# Patient Record
Sex: Female | Born: 1955
Health system: Southern US, Community
[De-identification: ages and names within clinical notes are randomized; demographics above are authoritative.]

## PROBLEM LIST (undated history)

## (undated) DIAGNOSIS — E039 Hypothyroidism, unspecified: Secondary | ICD-10-CM

## (undated) DIAGNOSIS — E7800 Pure hypercholesterolemia, unspecified: Secondary | ICD-10-CM

## (undated) DIAGNOSIS — M1990 Unspecified osteoarthritis, unspecified site: Secondary | ICD-10-CM

## (undated) DIAGNOSIS — I10 Essential (primary) hypertension: Secondary | ICD-10-CM

## (undated) DIAGNOSIS — E079 Disorder of thyroid, unspecified: Secondary | ICD-10-CM

## (undated) DIAGNOSIS — I6529 Occlusion and stenosis of unspecified carotid artery: Secondary | ICD-10-CM

## (undated) HISTORY — DX: Occlusion and stenosis of unspecified carotid artery: I65.29

## (undated) HISTORY — PX: JOINT REPLACEMENT: SHX530

## (undated) HISTORY — DX: Pure hypercholesterolemia, unspecified: E78.00

---

## 1980-11-30 HISTORY — PX: BACK SURGERY: SHX140

## 2005-09-02 ENCOUNTER — Emergency Department (HOSPITAL_COMMUNITY): Admission: EM | Admit: 2005-09-02 | Discharge: 2005-09-02 | Payer: Self-pay | Admitting: Emergency Medicine

## 2010-11-30 HISTORY — PX: HIP PINNING: SHX1757

## 2013-12-30 ENCOUNTER — Emergency Department (HOSPITAL_COMMUNITY)
Admission: EM | Admit: 2013-12-30 | Discharge: 2013-12-30 | Disposition: A | Discharge status: Home / Self Care | Payer: No Typology Code available for payment source | Attending: Emergency Medicine | Admitting: Emergency Medicine

## 2013-12-30 ENCOUNTER — Emergency Department (HOSPITAL_COMMUNITY): Payer: No Typology Code available for payment source

## 2013-12-30 ENCOUNTER — Encounter (HOSPITAL_COMMUNITY): Payer: Self-pay | Admitting: Emergency Medicine

## 2013-12-30 DIAGNOSIS — E079 Disorder of thyroid, unspecified: Secondary | ICD-10-CM | POA: Insufficient documentation

## 2013-12-30 DIAGNOSIS — Z79899 Other long term (current) drug therapy: Secondary | ICD-10-CM | POA: Insufficient documentation

## 2013-12-30 DIAGNOSIS — Z8781 Personal history of (healed) traumatic fracture: Secondary | ICD-10-CM | POA: Insufficient documentation

## 2013-12-30 DIAGNOSIS — M25569 Pain in unspecified knee: Secondary | ICD-10-CM | POA: Insufficient documentation

## 2013-12-30 DIAGNOSIS — M25562 Pain in left knee: Secondary | ICD-10-CM

## 2013-12-30 HISTORY — DX: Disorder of thyroid, unspecified: E07.9

## 2013-12-30 MED ORDER — OXYCODONE-ACETAMINOPHEN 5-325 MG PO TABS: 1.0000 | ORAL_TABLET | ORAL | Status: DC | PRN

## 2013-12-30 MED ORDER — OXYCODONE-ACETAMINOPHEN 5-325 MG PO TABS
1.0000 | ORAL_TABLET | Freq: Once | ORAL | Status: AC
  Administered 2013-12-30: 1 via ORAL
  Filled 2013-12-30: qty 1

## 2013-12-30 NOTE — ED Notes (Signed)
She c/o nn-traumatic left knee pain/stiffness x 6 days.  She is in no distress.  She cites hx of left hip pinning with subsequent infection "years ago".

## 2013-12-30 NOTE — ED Notes (Signed)
Pt reports marked decrease in pain

## 2013-12-30 NOTE — ED Provider Notes (Signed)
Medical screening examination/treatment/procedure(s) were performed by non-physician practitioner and as supervising physician I was immediately available for consultation/collaboration.  EKG Interpretation   None        Derwood KaplanAnkit Editha Bridgeforth, MD 12/30/13 1549

## 2013-12-30 NOTE — Discharge Instructions (Signed)
Take the prescribed medication as directed. Follow-up with orthopedics, Dr. Roda ShuttersXu for further evaluation and management of arthritis. Return to the ED for new or worsening symptoms.

## 2013-12-30 NOTE — ED Provider Notes (Signed)
CSN: [id number]     Arrival date & time 12/30/13  7829 History   First MD Initiated Contact with Patient 12/30/13 1010     Chief Complaint  Patient presents with  . Knee Pain   (Consider location/radiation/quality/duration/timing/severity/associated sxs/prior Treatment) The history is provided by the patient and medical records.   This is a 58 year old female with past medical history significant for thyroid disease, presenting to the ED for left knee pain.  Patient states pain has been present for the past 11 years, but worse over the past 6 days. No recent injury, trauma, or falls.  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 Patient states he feels like he never fully recovered from the infection. Patient is ambulating with a cane but states she muscular up and down stairs in her home which she thinks is making her pain worse. She denies any numbness or paresthesias of left lower extremity.  No recent fevers or chills.  She has tried icing and elevating knee, heat therapy, wrapping with ace bandage, and alternating tylenol/motrin without improvement.  Past Medical History  Diagnosis Date  . Thyroid disease    Past Surgical History  Procedure Laterality Date  . Hip pinning Left    History reviewed. No pertinent family history. History  Substance Use Topics  . Smoking status: Never Smoker   . Smokeless tobacco: Not on file  . Alcohol Use: Not on file   OB History   Grav Para Term Preterm Abortions TAB SAB Ect Mult Living                 Review of Systems  Musculoskeletal: Positive for arthralgias.  All other systems reviewed and are negative.    Allergies  Nsaids  Home Medications   Current Outpatient Rx  Name  Route  Sig  Dispense  Refill  . acetaminophen (TYLENOL) 325 MG tablet   Oral   Take 650 mg by mouth every 6 (six) hours as needed for moderate pain.         . hydrochlorothiazide  (MICROZIDE) 12.5 MG capsule   Oral   Take 12.5 mg by mouth every morning.         Marland Kitchen levothyroxine (SYNTHROID, LEVOTHROID) 125 MCG tablet   Oral   Take 125 mcg by mouth daily before breakfast.          BP 141/91  Pulse 84  Temp(Src) 97.8 F (36.6 C) (Oral)  Resp 12  SpO2 98%  Physical Exam  Nursing note and vitals reviewed. Constitutional: She is oriented to person, place, and time. She appears well-developed and well-nourished.  HENT:  Head: Normocephalic and atraumatic.  Mouth/Throat: Oropharynx is clear and moist.  Eyes: Conjunctivae and EOM are normal. Pupils are equal, round, and reactive to light.  Neck: Normal range of motion.  Cardiovascular: Normal rate, regular rhythm and normal heart sounds.   Pulmonary/Chest: Effort normal and breath sounds normal. No respiratory distress. She has no wheezes.  Musculoskeletal:       Left knee: She exhibits decreased range of motion, swelling, effusion and bony tenderness. She exhibits no ecchymosis, normal alignment, no LCL laxity and no MCL laxity. Tenderness found. Medial joint line tenderness noted.  Left knee with TTP and knee effusion along medial joint line; limited ROM due to pain with crepitus present; distal sensation intact; ambulating assisted with cane  Neurological: She is alert and oriented to person, place, and time.  Skin: Skin is warm and dry.  Psychiatric: She has a normal mood and affect.    ED Course  Procedures (including critical care time) Labs Review Labs Reviewed - No data to display Imaging Review Dg Knee Complete 4 Views Left  12/30/2013   CLINICAL DATA:  Worsening knee pain. Inability to bear weight. Limited range of motion.  EXAM: LEFT KNEE - COMPLETE 4+ VIEW  COMPARISON:  None.  FINDINGS: No evidence of acute fracture or dislocation.  A large knee joint effusion is seen. Moderate to severe tricompartmental osteoarthritis is seen.  IMPRESSION: Large knee joint effusion.  Tricompartmental  osteoarthritis.   Electronically Signed   By: Myles RosenthalJohn  Stahl M.D.   On: 12/30/2013 10:31    EKG Interpretation   None       MDM   1. Knee pain, left    X-ray with large knee joint effusion and severe osteoarthritis. Patient does not want knee effusion drained at this time. No signs/sx concerning for gout or septic joint.  Will apply knee sleeve and started on Percocet. She is referred to orthopedics for followup given her prior history.  Discussed plan with pt and son at bedside, they acknowledged understanding and agreed.  Return precautions advised.  Garlon HatchetLisa M Lashina Milles, PA-C 12/30/13 1227

## 2014-01-22 ENCOUNTER — Ambulatory Visit: Payer: No Typology Code available for payment source | Attending: Internal Medicine | Admitting: Internal Medicine

## 2014-01-22 ENCOUNTER — Encounter: Payer: Self-pay | Admitting: Internal Medicine

## 2014-01-22 VITALS — BP 135/70 | HR 93 | Temp 97.9°F | Resp 16 | Wt 157.0 lb

## 2014-01-22 DIAGNOSIS — E039 Hypothyroidism, unspecified: Secondary | ICD-10-CM

## 2014-01-22 DIAGNOSIS — M1712 Unilateral primary osteoarthritis, left knee: Secondary | ICD-10-CM

## 2014-01-22 DIAGNOSIS — M1710 Unilateral primary osteoarthritis, unspecified knee: Secondary | ICD-10-CM | POA: Insufficient documentation

## 2014-01-22 DIAGNOSIS — IMO0002 Reserved for concepts with insufficient information to code with codable children: Secondary | ICD-10-CM

## 2014-01-22 DIAGNOSIS — L989 Disorder of the skin and subcutaneous tissue, unspecified: Secondary | ICD-10-CM | POA: Insufficient documentation

## 2014-01-22 DIAGNOSIS — Z139 Encounter for screening, unspecified: Secondary | ICD-10-CM

## 2014-01-22 DIAGNOSIS — I10 Essential (primary) hypertension: Secondary | ICD-10-CM | POA: Insufficient documentation

## 2014-01-22 DIAGNOSIS — F17200 Nicotine dependence, unspecified, uncomplicated: Secondary | ICD-10-CM | POA: Insufficient documentation

## 2014-01-22 LAB — COMPLETE METABOLIC PANEL WITH GFR
ALT: 12 U/L (ref 0–35)
AST: 13 U/L (ref 0–37)
Albumin: 4.5 g/dL (ref 3.5–5.2)
Alkaline Phosphatase: 64 U/L (ref 39–117)
BUN: 14 mg/dL (ref 6–23)
CO2: 29 meq/L (ref 19–32)
CREATININE: 0.59 mg/dL (ref 0.50–1.10)
Calcium: 9.5 mg/dL (ref 8.4–10.5)
Chloride: 104 mEq/L (ref 96–112)
GFR, Est African American: >89 mL/min
GFR, Est Non African American: >89 mL/min
Glucose, Bld: 98 mg/dL (ref 70–99)
Potassium: 4.1 mEq/L (ref 3.5–5.3)
Sodium: 140 mEq/L (ref 135–145)
Total Bilirubin: 0.2 mg/dL (ref 0.2–1.2)
Total Protein: 7.3 g/dL (ref 6.0–8.3)

## 2014-01-22 LAB — CBC WITH DIFFERENTIAL/PLATELET
BASOS PCT: 1 % (ref 0–1)
Basophils Absolute: 0.1 10*3/uL (ref 0.0–0.1)
EOS ABS: 0.6 10*3/uL (ref 0.0–0.7)
Eosinophils Relative: 7 % — ABNORMAL HIGH (ref 0–5)
HCT: 40 % (ref 36.0–46.0)
Hemoglobin: 13.7 g/dL (ref 12.0–15.0)
Lymphocytes Relative: 37 % (ref 12–46)
Lymphs Abs: 3.3 10*3/uL (ref 0.7–4.0)
MCH: 32.1 pg (ref 26.0–34.0)
MCHC: 34.3 g/dL (ref 30.0–36.0)
MCV: 93.7 fL (ref 78.0–100.0)
Monocytes Absolute: 0.7 10*3/uL (ref 0.1–1.0)
Monocytes Relative: 8 % (ref 3–12)
Neutro Abs: 4.1 10*3/uL (ref 1.7–7.7)
Neutrophils Relative %: 47 % (ref 43–77)
Platelets: 333 10*3/uL (ref 150–400)
RBC: 4.27 MIL/uL (ref 3.87–5.11)
RDW: 13.5 % (ref 11.5–15.5)
WBC: 8.8 10*3/uL (ref 4.0–10.5)

## 2014-01-22 LAB — LIPID PANEL
Cholesterol: 226 mg/dL — ABNORMAL HIGH (ref 0–200)
HDL: 50 mg/dL (ref >39)
TRIGLYCERIDES: 418 mg/dL — AB (ref <150)
Total CHOL/HDL Ratio: 4.5 Ratio

## 2014-01-22 MED ORDER — HYDROCHLOROTHIAZIDE 12.5 MG PO CAPS: 12.5000 mg | ORAL_CAPSULE | Freq: Every morning | ORAL | Status: DC

## 2014-01-22 MED ORDER — LEVOTHYROXINE SODIUM 125 MCG PO TABS: 125.0000 ug | ORAL_TABLET | Freq: Every day | ORAL | Status: DC

## 2014-01-22 NOTE — Progress Notes (Signed)
Patient Demographics  Tiffany Navarro, is a 58 y.o. female  BJY:[id number]  YQM:[id number]  DOB - 03/22/1956  CC:  Chief Complaint  Patient presents with  . Establish Care       HPI: Tiffany Navarro is a 58 y.o. female here today to establish medical care.patient has history of hypertension and is on hydrochlorothiazide 12.5 mg daily, history of hypothyroidism and is taking levothyroxine 125 mcg daily, recently she went to the emergency room with symptoms of left knee pain, EMR reviewed she was diagnosed with left knee effusion and osteoarthritisher and was advised to see orthopedic Dr.patient reports improvement in the symptoms and currently takes only Tylenol when necessary for pain.patient also reported to have prescribed lesion on her right side of face for several years and is requesting to see a dermatologist. Patient denies any discharge fever chills. Patient has No headache, No chest pain, No abdominal pain - No Nausea, No new weakness tingling or numbness, No Cough - SOB.  Allergies  Allergen Reactions  . Nsaids Other (See Comments)    "stamach pain"   Past Medical History  Diagnosis Date  . Thyroid disease    Current Outpatient Prescriptions on File Prior to Visit  Medication Sig Dispense Refill  . acetaminophen (TYLENOL) 325 MG tablet Take 650 mg by mouth every 6 (six) hours as needed for moderate pain.      Marland Kitchen. oxyCODONE-acetaminophen (PERCOCET/ROXICET) 5-325 MG per tablet Take 1 tablet by mouth every 4 (four) hours as needed.  15 tablet  0   No current facility-administered medications on file prior to visit.   Family History  Problem Relation Age of Onset  . Hypertension Mother   . Hypertension Father   . Stroke Father   . Hypertension Brother    History   Social History  . Marital Status: Legally Separated    Spouse Name: N/A    Number of Children: N/A  . Years of Education: N/A   Occupational History  . Not on file.   Social History Main Topics    . Smoking status: Current Some Day Smoker  . Smokeless tobacco: Not on file  . Alcohol Use: No  . Drug Use: No  . Sexual Activity: Not on file   Other Topics Concern  . Not on file   Social History Narrative  . No narrative on file    Review of Systems: Constitutional: Negative for fever, chills, diaphoresis, activity change, appetite change and fatigue. HENT: Negative for ear pain, nosebleeds, congestion, facial swelling, rhinorrhea, neck pain, neck stiffness and ear discharge.  Eyes: Negative for pain, discharge, redness, itching and visual disturbance. Respiratory: Negative for cough, choking, chest tightness, shortness of breath, wheezing and stridor.  Cardiovascular: Negative for chest pain, palpitations and leg swelling. Gastrointestinal: Negative for abdominal distention. Genitourinary: Negative for dysuria, urgency, frequency, hematuria, flank pain, decreased urine volume, difficulty urinating and dyspareunia.  Musculoskeletal: Negative for back pain, joint swelling, +arthralgia and gait problem. Neurological: Negative for dizziness, tremors, seizures, syncope, facial asymmetry, speech difficulty, weakness, light-headedness, numbness and headaches.  Hematological: Negative for adenopathy. Does not bruise/bleed easily. Psychiatric/Behavioral: Negative for hallucinations, behavioral problems, confusion, dysphoric mood, decreased concentration and agitation.    Objective:   Filed Vitals:   01/22/14 1546  BP: 135/70  Pulse:   Temp:   Resp:     Physical Exam: Constitutional: Patient appears well-developed and well-nourished. No distress. HENT: Normocephalic, atraumatic, External right and left ear normal. Oropharynx is clear and moist.  Eyes:  Conjunctivae and EOM are normal. PERRLA, no scleral icterus. Neck: Normal ROM. Neck supple. No JVD. No tracheal deviation. No thyromegaly. CVS: RRR, S1/S2 +, no murmurs, no gallops, no carotid bruit.  Pulmonary: Effort and breath  sounds normal, no stridor, rhonchi, wheezes, rales.  Abdominal: Soft. BS +, no distension, tenderness, rebound or guarding.  Musculoskeletal: Normal range of motion. Left tenderness, crepitation positive Neuro: Alert. Normal reflexes, muscle tone coordination. No cranial nerve deficit. Skin: Skin is warm and dry. No rash noted. Not diaphoretic. Right side of the face patient has localized lesion non-tender no apparent discharge. Psychiatric: Normal mood and affect. Behavior, judgment, thought content normal.  No results found for this basename: WBC, HGB, HCT, MCV, PLT   No results found for this basename: CREATININE, BUN, NA, K, CL, CO2    No results found for this basename: HGBA1C   Lipid Panel  No results found for this basename: chol, trig, hdl, cholhdl, vldl, ldlcalc       Assessment and plan:   1. Unspecified hypothyroidism Recheck TSH level. - levothyroxine (SYNTHROID, LEVOTHROID) 125 MCG tablet; Take 1 tablet (125 mcg total) by mouth daily before breakfast.  Dispense: 30 tablet; Refill: 2 - TSH  2. Essential hypertension, benign Advise for low salt diet continue with hydrochlorothiazide. - hydrochlorothiazide (MICROZIDE) 12.5 MG capsule; Take 1 capsule (12.5 mg total) by mouth every morning.  Dispense: 30 capsule; Refill: 2 - COMPLETE METABOLIC PANEL WITH GFR  3. Osteoarthritis of left knee  - Ambulatory referral to Orthopedic Surgery  4. Skin lesion of face  - Ambulatory referral to Dermatology  5. Screening  - CBC with Differential - Lipid panel - Vit D  25 hydroxy (rtn osteoporosis monitoring)        Health Maintenance -Colonoscopy: uptodate   -Mammogram: uptodate  -Vaccinations:    uptodate with -Influenza  Return in about 6 weeks (around 03/05/2014).   Doris Cheadle, MD

## 2014-01-22 NOTE — Progress Notes (Signed)
Patient here to establish care Has history of three hip surgeries and two back surgeries Has thyroid disease Complains of bilateral knee pain

## 2014-01-23 LAB — TSH: TSH: 4.346 u[IU]/mL (ref 0.350–4.500)

## 2014-01-23 LAB — VITAMIN D 25 HYDROXY (VIT D DEFICIENCY, FRACTURES): VIT D 25 HYDROXY: 33 ng/mL (ref 30–89)

## 2014-01-24 ENCOUNTER — Telehealth: Payer: Self-pay

## 2014-01-24 NOTE — Telephone Encounter (Signed)
Message copied by Lestine MountJUAREZ, Doll Frazee L on Wed Jan 24, 2014 12:46 PM ------      Message from: Doris CheadleADVANI, DEEPAK      Created: Wed Jan 24, 2014 12:15 PM       Blood work reviewed, noticed elevated triglycerides, advise patient for low fat diet. And start taking fenofibrate 145 mg daily       ------

## 2014-01-24 NOTE — Telephone Encounter (Signed)
Spoke with Tiffany FanningJulie She will let her mom know the results

## 2014-02-02 ENCOUNTER — Ambulatory Visit: Payer: No Typology Code available for payment source | Admitting: Family Medicine

## 2014-02-16 ENCOUNTER — Ambulatory Visit (INDEPENDENT_AMBULATORY_CARE_PROVIDER_SITE_OTHER): Payer: No Typology Code available for payment source | Admitting: Family Medicine

## 2014-02-16 ENCOUNTER — Encounter: Payer: Self-pay | Admitting: Family Medicine

## 2014-02-16 VITALS — BP 130/82 | HR 65 | Ht 65.0 in | Wt 157.0 lb

## 2014-02-16 DIAGNOSIS — M1712 Unilateral primary osteoarthritis, left knee: Secondary | ICD-10-CM

## 2014-02-16 DIAGNOSIS — M1710 Unilateral primary osteoarthritis, unspecified knee: Secondary | ICD-10-CM

## 2014-02-16 DIAGNOSIS — IMO0002 Reserved for concepts with insufficient information to code with codable children: Secondary | ICD-10-CM

## 2014-02-16 MED ORDER — MELOXICAM 15 MG PO TABS: 15.0000 mg | ORAL_TABLET | Freq: Every day | ORAL | Status: DC

## 2014-02-16 MED ORDER — METHYLPREDNISOLONE ACETATE 40 MG/ML IJ SUSP
40.0000 mg | Freq: Once | INTRAMUSCULAR | Status: AC
  Administered 2014-02-16: 40 mg via INTRA_ARTICULAR

## 2014-02-16 NOTE — Progress Notes (Signed)
CC: Left knee pain HPI: Patient is a very pleasant 58 year old female who presents for evaluation of left knee pain. She states she has had a little difficulty with her left knee for a long time but it really flared up in October and then even more in December or January. 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 She feels like she cannot walk properly and walks on her toe. She denies any injury, trigger, or fall as etiology of the increased pain. She does have a history of a left hip fracture that required a total of 3 surgeries do to what sounds like infection. She is currently on tumor it was black pepper and has also try glucosamine and fish oil. She does have some Percocet that she takes as needed about one half tablet every other week when she has more severe pain. She has not really had any other treatment for her pain.  ROS: As above in the HPI. All other systems are stable or negative.  PMH: Hypertension, hypothyroidism  PSH: Left hip surgery x3, back surgery  Social: Patient is a former smoker but quit a long time ago. She does not drink alcohol. She previously worked as a Investment banker, operationalchef but has been unable to work due to her knee pain. Family: No family history of diabetes, heart disease, high blood pressure  Allergies: None    OBJECTIVE: APPEARANCE:  Patient in no acute distress.The patient appeared well nourished and normally developed. HEENT: No scleral icterus. Conjunctiva non-injected Resp: Non labored Skin: No rash MSK: Left Knee - Inspection shows valgus deformity and bony hypertrophy. No effusion. Gait shows 10 flexion contracture of the left knee with toe-touch gait. - Palpation with lateral joint line tenderness - ROM with loss of 10 of extension - Strength 5/5 in flexion and extension. - Ligaments with solid consistent endpoints including ACL, PCL, LCL, MCL.  - Neurovascularly intact  MSK  US: Not performed  Radiographs: X-rays of left knee from outside facility were reviewed today. This shows end-stage lateral compartment of DJD of the left knee as well as severe patellofemoral compartment disease. There is significant valgus deformity  ASSESSMENT: #1. End-stage left knee DJD  PLAN: Discuss treatment options with the patient. Encouraged her to continue her supplements. We will also add meloxicam every morning for one week and then as needed. We will get her into physical therapy for strengthening. We will pursue a corticosteroid injection today. She will followup in 6 weeks. At that time I would like to readdress her functional leg length discrepancy and see if there are any modifications to her shoes that would improve her gait. I was unable to do this today because she was in so much pain that we could not get an accurate picture of her true gait.  Consent obtained and verified.  Time-out conducted.  Noted no overlying erythema, induration, or other signs of local infection.  Skin prepped in a sterile fashion.  Topical analgesic spray: Ethyl chloride.  Joint: Left knee Needle: 22-gauge 1-1/2 inch Completed without difficulty.  Meds: 4 cc 1% lidocaine, 1 cc depomedrol Advised to call if fevers/chills, erythema, induration, drainage, or persistent bleeding.

## 2014-02-16 NOTE — Patient Instructions (Signed)
Thank you for coming in today  1. Injection today 2. Take meloxicam daily with breakfast for 1 week and then as needed 3. Continue tumeric 4. Refer to physical therapy

## 2014-03-07 ENCOUNTER — Ambulatory Visit: Payer: Self-pay | Admitting: Internal Medicine

## 2014-03-30 ENCOUNTER — Ambulatory Visit: Payer: No Typology Code available for payment source | Admitting: Family Medicine

## 2014-05-18 ENCOUNTER — Other Ambulatory Visit: Payer: Self-pay | Admitting: Internal Medicine

## 2014-05-18 ENCOUNTER — Telehealth: Payer: Self-pay | Admitting: Emergency Medicine

## 2014-05-18 NOTE — Telephone Encounter (Signed)
Left message for pt to call when message received 

## 2014-05-23 NOTE — Telephone Encounter (Signed)
Pt calling for med refill, has appt on 05/30/14 but will be running out of thyroid meds before then, please f/u with pt.

## 2014-05-30 ENCOUNTER — Encounter: Payer: Self-pay | Admitting: Internal Medicine

## 2014-05-30 ENCOUNTER — Ambulatory Visit: Payer: No Typology Code available for payment source | Attending: Internal Medicine | Admitting: Internal Medicine

## 2014-05-30 VITALS — BP 130/77 | HR 75 | Temp 98.2°F | Ht 65.0 in | Wt 166.0 lb

## 2014-05-30 DIAGNOSIS — I10 Essential (primary) hypertension: Secondary | ICD-10-CM

## 2014-05-30 DIAGNOSIS — E781 Pure hyperglyceridemia: Secondary | ICD-10-CM | POA: Insufficient documentation

## 2014-05-30 DIAGNOSIS — E039 Hypothyroidism, unspecified: Secondary | ICD-10-CM

## 2014-05-30 MED ORDER — LEVOTHYROXINE SODIUM 125 MCG PO TABS: ORAL_TABLET | ORAL | Status: DC

## 2014-05-30 MED ORDER — HYDROCHLOROTHIAZIDE 12.5 MG PO CAPS: ORAL_CAPSULE | ORAL | Status: DC

## 2014-05-30 MED ORDER — GEMFIBROZIL 600 MG PO TABS: 600.0000 mg | ORAL_TABLET | Freq: Two times a day (BID) | ORAL | Status: DC

## 2014-05-30 NOTE — Progress Notes (Signed)
Patient in for medication refills for Microzide and levothyroxine otherwise denies any complains at this time.gp

## 2014-05-30 NOTE — Progress Notes (Signed)
MRN: [id number] Name: Tiffany Navarro  Sex: female Age: 58 y.o. DOB: 12/03/1955  Allergies: Nsaids  Chief Complaint  Patient presents with  . Hypertension    follow up  . Medication Management    HPI: Patient is 58 y.o. female who history of hypertension, hypothyroidism comes today for followup, patient requesting refill on her medication, her previous blood work reviewed TSH level is within normal range today her blood pressure is normal, noticed to have elevated triglycerides, I have advised patient for low fat diet and she'll also try Lopid.  Past Medical History  Diagnosis Date  . Thyroid disease     Past Surgical History  Procedure Laterality Date  . Hip pinning Left   . Back surgery        Medication List       This list is accurate as of: 05/30/14 11:10 AM.  Always use your most recent med list.               acetaminophen 325 MG tablet  Commonly known as:  TYLENOL  Take 650 mg by mouth every 6 (six) hours as needed for moderate pain.     gemfibrozil 600 MG tablet  Commonly known as:  LOPID  Take 1 tablet (600 mg total) by mouth 2 (two) times daily before a meal.     hydrochlorothiazide 12.5 MG capsule  Commonly known as:  MICROZIDE  TAKE ONE CAPSULE BY MOUTH IN THE MORNING     levothyroxine 125 MCG tablet  Commonly known as:  SYNTHROID, LEVOTHROID  TAKE ONE TABLET BY MOUTH ONCE DAILY BEFORE  BREAKFAST     meloxicam 15 MG tablet  Commonly known as:  MOBIC  Take 1 tablet (15 mg total) by mouth daily.     oxyCODONE-acetaminophen 5-325 MG per tablet  Commonly known as:  PERCOCET/ROXICET  Take 1 tablet by mouth every 4 (four) hours as needed.        Meds ordered this encounter  Medications  . hydrochlorothiazide (MICROZIDE) 12.5 MG capsule    Sig: TAKE ONE CAPSULE BY MOUTH IN THE MORNING    Dispense:  30 capsule    Refill:  3  . levothyroxine (SYNTHROID, LEVOTHROID) 125 MCG tablet    Sig: TAKE ONE TABLET BY MOUTH ONCE DAILY BEFORE   BREAKFAST    Dispense:  30 tablet    Refill:  3  . gemfibrozil (LOPID) 600 MG tablet    Sig: Take 1 tablet (600 mg total) by mouth 2 (two) times daily before a meal.    Dispense:  60 tablet    Refill:  3     There is no immunization history on file for this patient.  Family History  Problem Relation Age of Onset  . Hypertension Mother   . Hypertension Father   . Stroke Father   . Hypertension Brother     History  Substance Use Topics  . Smoking status: Current Some Day Smoker  . Smokeless tobacco: Not on file  . Alcohol Use: No    Review of Systems   As noted in HPI  Filed Vitals:   05/30/14 1021  BP: 130/77  Pulse: 75  Temp: 98.2 F (36.8 C)    Physical Exam  Physical Exam  Constitutional: No distress.  Eyes: EOM are normal. Pupils are equal, round, and reactive to light.  Cardiovascular: Normal rate and regular rhythm.   Pulmonary/Chest: Breath sounds normal. No respiratory distress. She has no wheezes. She has no  rales.  Musculoskeletal: She exhibits no edema.    CBC    Component Value Date/Time   WBC 8.8 01/22/2014 1605   RBC 4.27 01/22/2014 1605   HGB 13.7 01/22/2014 1605   HCT 40.0 01/22/2014 1605   PLT 333 01/22/2014 1605   MCV 93.7 01/22/2014 1605   LYMPHSABS 3.3 01/22/2014 1605   MONOABS 0.7 01/22/2014 1605   EOSABS 0.6 01/22/2014 1605   BASOSABS 0.1 01/22/2014 1605    CMP     Component Value Date/Time   NA 140 01/22/2014 1605   K 4.1 01/22/2014 1605   CL 104 01/22/2014 1605   CO2 29 01/22/2014 1605   GLUCOSE 98 01/22/2014 1605   BUN 14 01/22/2014 1605   CREATININE 0.59 01/22/2014 1605   CALCIUM 9.5 01/22/2014 1605   PROT 7.3 01/22/2014 1605   ALBUMIN 4.5 01/22/2014 1605   AST 13 01/22/2014 1605   ALT 12 01/22/2014 1605   ALKPHOS 64 01/22/2014 1605   BILITOT 0.2 01/22/2014 1605   GFRNONAA >89 01/22/2014 1605   GFRAA >89 01/22/2014 1605    Lab Results  Component Value Date/Time   CHOL 226* 01/22/2014  4:05 PM    No components found with this  basename: hga1c    Lab Results  Component Value Date/Time   AST 13 01/22/2014  4:05 PM    Assessment and Plan  Essential hypertension, benign - Plan: Blood pressure is well controlled continue with her hydrochlorothiazide (MICROZIDE) 12.5 MG capsule  Unspecified hypothyroidism - Plan: TSH level is in normal range continue with levothyroxine (SYNTHROID, LEVOTHROID) 125 MCG tablet  Hypertriglyceridemia - Plan: Advised patient for low fat diet, started on gemfibrozil (LOPID) 600 MG tablet, will repeat lipid panel in 3 months.  Return in about 3 months (around 08/30/2014) for hypertension, hypothyroid, hyperipidemia.  Doris CheadleADVANI, Takeisha Cianci, MD

## 2014-05-30 NOTE — Patient Instructions (Signed)
Low-Fat Diet for Pancreatitis or Gallbladder Conditions °A low-fat diet can be helpful if you have pancreatitis or a gallbladder condition. With these conditions, your pancreas and gallbladder have trouble digesting fats. A healthy eating plan with less fat will help rest your pancreas and gallbladder and reduce your symptoms. °WHAT DO I NEED TO KNOW ABOUT THIS DIET? °· Eat a low-fat diet. °¨ Reduce your fat intake to less than 20-30% of your total daily calories. This is less than 50-60 g of fat per day. °¨ Remember that you need some fat in your diet. Ask your dietician what your daily goal should be. °¨ Choose nonfat and low-fat healthy foods. Look for the words "nonfat," "low fat," or "fat free." °¨ As a guide, look on the label and choose foods with less than 3 g of fat per serving. Eat only one serving. °· Avoid alcohol. °· Do not smoke. If you need help quitting, talk with your health care provider. °· Eat small frequent meals instead of three large heavy meals. °WHAT FOODS CAN I EAT? °Grains °Include healthy grains and starches such as potatoes, wheat bread, fiber-rich cereal, and brown rice. Choose whole grain options whenever possible. In adults, whole grains should account for 45-65% of your daily calories.  °Fruits and Vegetables °Eat plenty of fruits and vegetables. Fresh fruits and vegetables add fiber to your diet. °Meats and Other Protein Sources °Eat lean meat such as chicken and pork. Trim any fat off of meat before cooking it. Eggs, fish, and beans are other sources of protein. In adults, these foods should account for 10-35% of your daily calories. °Dairy °Choose low-fat milk and dairy options. Dairy includes fat and protein, as well as calcium.  °Fats and Oils °Limit high-fat foods such as fried foods, sweets, baked goods, sugary drinks.  °Other °Creamy sauces and condiments, such as mayonnaise, can add extra fat. Think about whether or not you need to use them, or use smaller amounts or low fat  options. °WHAT FOODS ARE NOT RECOMMENDED? °· High fat foods, such as: °¨ Baked goods. °¨ Ice cream. °¨ French toast. °¨ Sweet rolls. °¨ Pizza. °¨ Cheese bread. °¨ Foods covered with batter, butter, creamy sauces, or cheese. °¨ Fried foods. °¨ Sugary drinks and desserts. °· Foods that cause gas or bloating °Document Released: 11/21/2013 Document Reviewed: 10/30/2013 °[employer]® Patient Information ©2015 [employer]. This information is not intended to replace advice given to you by your health care provider. Make sure you discuss any questions you have with your health care provider. ° °

## 2014-07-26 ENCOUNTER — Emergency Department (HOSPITAL_COMMUNITY): Payer: No Typology Code available for payment source

## 2014-07-26 ENCOUNTER — Encounter (HOSPITAL_COMMUNITY): Payer: Self-pay | Admitting: Emergency Medicine

## 2014-07-26 ENCOUNTER — Emergency Department (HOSPITAL_COMMUNITY)
Admission: EM | Admit: 2014-07-26 | Discharge: 2014-07-26 | Disposition: A | Discharge status: Home / Self Care | Payer: No Typology Code available for payment source | Attending: Emergency Medicine | Admitting: Emergency Medicine

## 2014-07-26 DIAGNOSIS — R0602 Shortness of breath: Secondary | ICD-10-CM | POA: Insufficient documentation

## 2014-07-26 DIAGNOSIS — R079 Chest pain, unspecified: Secondary | ICD-10-CM | POA: Insufficient documentation

## 2014-07-26 DIAGNOSIS — R300 Dysuria: Secondary | ICD-10-CM | POA: Diagnosis not present

## 2014-07-26 DIAGNOSIS — Z79899 Other long term (current) drug therapy: Secondary | ICD-10-CM | POA: Insufficient documentation

## 2014-07-26 DIAGNOSIS — R0789 Other chest pain: Secondary | ICD-10-CM | POA: Insufficient documentation

## 2014-07-26 DIAGNOSIS — E079 Disorder of thyroid, unspecified: Secondary | ICD-10-CM | POA: Insufficient documentation

## 2014-07-26 DIAGNOSIS — I10 Essential (primary) hypertension: Secondary | ICD-10-CM | POA: Insufficient documentation

## 2014-07-26 DIAGNOSIS — R350 Frequency of micturition: Secondary | ICD-10-CM | POA: Insufficient documentation

## 2014-07-26 HISTORY — DX: Essential (primary) hypertension: I10

## 2014-07-26 LAB — URINALYSIS, ROUTINE W REFLEX MICROSCOPIC
Bilirubin Urine: NEGATIVE
Glucose, UA: NEGATIVE mg/dL
Hgb urine dipstick: NEGATIVE
Ketones, ur: NEGATIVE mg/dL
Nitrite: NEGATIVE
Protein, ur: NEGATIVE mg/dL
Specific Gravity, Urine: 1.005 (ref 1.005–1.030)
Urobilinogen, UA: 0.2 mg/dL (ref 0.0–1.0)
pH: 7 (ref 5.0–8.0)

## 2014-07-26 LAB — D-DIMER, QUANTITATIVE (NOT AT ARMC): D-Dimer, Quant: 0.29 ug/mL-FEU (ref 0.00–0.48)

## 2014-07-26 LAB — HEPATIC FUNCTION PANEL
ALK PHOS: 73 U/L (ref 39–117)
ALT: 16 U/L (ref 0–35)
AST: 19 U/L (ref 0–37)
Albumin: 4.3 g/dL (ref 3.5–5.2)
BILIRUBIN TOTAL: 0.2 mg/dL — AB (ref 0.3–1.2)
Total Protein: 7.7 g/dL (ref 6.0–8.3)

## 2014-07-26 LAB — CBC
HCT: 43.9 % (ref 36.0–46.0)
Hemoglobin: 15 g/dL (ref 12.0–15.0)
MCH: 31.5 pg (ref 26.0–34.0)
MCHC: 34.2 g/dL (ref 30.0–36.0)
MCV: 92.2 fL (ref 78.0–100.0)
PLATELETS: 280 10*3/uL (ref 150–400)
RBC: 4.76 MIL/uL (ref 3.87–5.11)
RDW: 12.9 % (ref 11.5–15.5)
WBC: 15.3 10*3/uL — AB (ref 4.0–10.5)

## 2014-07-26 LAB — URINE MICROSCOPIC-ADD ON

## 2014-07-26 LAB — BASIC METABOLIC PANEL
ANION GAP: 16 — AB (ref 5–15)
BUN: 10 mg/dL (ref 6–23)
CALCIUM: 10.1 mg/dL (ref 8.4–10.5)
CO2: 23 mEq/L (ref 19–32)
CREATININE: 0.55 mg/dL (ref 0.50–1.10)
Chloride: 98 mEq/L (ref 96–112)
GFR calc non Af Amer: >90 mL/min (ref >90)
Glucose, Bld: 103 mg/dL — ABNORMAL HIGH (ref 70–99)
Potassium: 4.1 mEq/L (ref 3.7–5.3)
SODIUM: 137 meq/L (ref 137–147)

## 2014-07-26 LAB — I-STAT TROPONIN, ED: Troponin i, poc: 0 ng/mL (ref 0.00–0.08)

## 2014-07-26 LAB — LIPASE, BLOOD: Lipase: 25 U/L (ref 11–59)

## 2014-07-26 LAB — TROPONIN I: Troponin I: <0.3 ng/mL (ref <0.30)

## 2014-07-26 MED ORDER — ONDANSETRON HCL 4 MG PO TABS: 4.0000 mg | ORAL_TABLET | Freq: Four times a day (QID) | ORAL | Status: DC

## 2014-07-26 MED ORDER — GI COCKTAIL ~~LOC~~
30.0000 mL | Freq: Once | ORAL | Status: AC
  Administered 2014-07-26: 30 mL via ORAL
  Filled 2014-07-26: qty 30

## 2014-07-26 MED ORDER — OMEPRAZOLE 20 MG PO CPDR: 20.0000 mg | DELAYED_RELEASE_CAPSULE | Freq: Every day | ORAL | Status: DC

## 2014-07-26 NOTE — Discharge Instructions (Signed)
Chest Pain (Nonspecific) Your testing today does not show a heart attack or a blood clot in the lung. Take the stomach medicine as prescribed. Follow up with the heart doctor for a stress test. Return to the ED if you develop new or worsening symptoms. It is often hard to give a specific diagnosis for the cause of chest pain. There is always a chance that your pain could be related to something serious, such as a heart attack or a blood clot in the lungs. You need to follow up with your health care provider for further evaluation. CAUSES   Heartburn.  Pneumonia or bronchitis.  Anxiety or stress.  Inflammation around your heart (pericarditis) or lung (pleuritis or pleurisy).  A blood clot in the lung.  A collapsed lung (pneumothorax). It can develop suddenly on its own (spontaneous pneumothorax) or from trauma to the chest.  Shingles infection (herpes zoster virus). The chest wall is composed of bones, muscles, and cartilage. Any of these can be the source of the pain.  The bones can be bruised by injury.  The muscles or cartilage can be strained by coughing or overwork.  The cartilage can be affected by inflammation and become sore (costochondritis). DIAGNOSIS  Lab tests or other studies may be needed to find the cause of your pain. Your health care provider may have you take a test called an ambulatory electrocardiogram (ECG). An ECG records your heartbeat patterns over a 24-hour period. You may also have other tests, such as:  Transthoracic echocardiogram (TTE). During echocardiography, sound waves are used to evaluate how blood flows through your heart.  Transesophageal echocardiogram (TEE).  Cardiac monitoring. This allows your health care provider to monitor your heart rate and rhythm in real time.  Holter monitor. This is a portable device that records your heartbeat and can help diagnose heart arrhythmias. It allows your health care provider to track your heart activity for  several days, if needed.  Stress tests by exercise or by giving medicine that makes the heart beat faster. TREATMENT   Treatment depends on what may be causing your chest pain. Treatment may include:  Acid blockers for heartburn.  Anti-inflammatory medicine.  Pain medicine for inflammatory conditions.  Antibiotics if an infection is present.  You may be advised to change lifestyle habits. This includes stopping smoking and avoiding alcohol, caffeine, and chocolate.  You may be advised to keep your head raised (elevated) when sleeping. This reduces the chance of acid going backward from your stomach into your esophagus. Most of the time, nonspecific chest pain will improve within 2-3 days with rest and mild pain medicine.  HOME CARE INSTRUCTIONS   If antibiotics were prescribed, take them as directed. Finish them even if you start to feel better.  For the next few days, avoid physical activities that bring on chest pain. Continue physical activities as directed.  Do not use any tobacco products, including cigarettes, chewing tobacco, or electronic cigarettes.  Avoid drinking alcohol.  Only take medicine as directed by your health care provider.  Follow your health care provider's suggestions for further testing if your chest pain does not go away.  Keep any follow-up appointments you made. If you do not go to an appointment, you could develop lasting (chronic) problems with pain. If there is any problem keeping an appointment, call to reschedule. SEEK MEDICAL CARE IF:   Your chest pain does not go away, even after treatment.  You have a rash with blisters on your chest.  You have a fever. SEEK IMMEDIATE MEDICAL CARE IF:   You have increased chest pain or pain that spreads to your arm, neck, jaw, back, or abdomen.  You have shortness of breath.  You have an increasing cough, or you cough up blood.  You have severe back or abdominal pain.  You feel nauseous or  vomit.  You have severe weakness.  You faint.  You have chills. This is an emergency. Do not wait to see if the pain will go away. Get medical help at once. Call your local emergency services (911 in [place] Do not drive yourself to the hospital. MAKE SURE YOU:   Understand these instructions.  Will watch your condition.  Will get help right away if you are not doing well or get worse. Document Released: 08/26/2005 Document Revised: 11/21/2013 Document Reviewed: 06/21/2008 [facility] Patient Information 2015 [place], [place]. This information is not intended to replace advice given to you by your health care provider. Make sure you discuss any questions you have with your health care provider.

## 2014-07-26 NOTE — ED Notes (Signed)
Pt reports need to void. Pt refuses assistance or wheelchair. Pt ambulated to nearby restroom to void.

## 2014-07-26 NOTE — ED Notes (Addendum)
Pt reports heartburn and SOB since 2 am this morning. Took tums with no relief. Pt denies n/v/d. Pt says her "neck is a little stiff". Pt also reports urinary freq and blood in the urine this am with some "bladder pressure". Says she has never had heartburn this bad.

## 2014-07-26 NOTE — ED Provider Notes (Signed)
CSN: [id number]     Arrival date & time 07/26/14  1047 History   First MD Initiated Contact with Patient 07/26/14 1113     Chief Complaint  Patient presents with  . Shortness of Breath  . Dysuria     (Consider location/radiation/quality/duration/timing/severity/associated sxs/prior Treatment) HPI Comments: Patient presents with "heartburn" that started around 2 AM. This was while she was laying in bed. She describes a burning sensation in her chest it does not radiate. Somewhat improved with TUMS. She denies having this pain the past. She reports no significant history of reflux disease. Denies any history of heart problems or lung problems. Later on this morning she developed some shortness of breath as well as urinary frequency and "bladder pressure and ". She denies any fever or cough. She denies any nausea or vomiting. She denies any headache or abdominal pain. The heartburn sensation is worse with lying down and better with sitting up. It is also worse with drinking water. It has waxed and wanes in severity since 2 AM but never gone away. She's never had a stress test.  The history is provided by the patient.    Past Medical History  Diagnosis Date  . Thyroid disease   . Hypertension    Past Surgical History  Procedure Laterality Date  . Hip pinning Left   . Back surgery     Family History  Problem Relation Age of Onset  . Hypertension Mother   . Hypertension Father   . Stroke Father   . Hypertension Brother    History  Substance Use Topics  . Smoking status: Current Some Day Smoker  . Smokeless tobacco: Not on file  . Alcohol Use: No   OB History   Grav Para Term Preterm Abortions TAB SAB Ect Mult Living                 Review of Systems  Constitutional: Negative for fever, activity change and appetite change.  HENT: Negative for congestion and rhinorrhea.   Respiratory: Positive for chest tightness and shortness of breath.   Cardiovascular: Positive for chest  pain.  Gastrointestinal: Negative for nausea, vomiting and abdominal pain.  Genitourinary: Positive for dysuria and frequency. Negative for flank pain, vaginal bleeding and vaginal discharge.  Musculoskeletal: Negative for arthralgias, back pain and myalgias.  Skin: Negative for rash.  Neurological: Negative for dizziness, weakness, light-headedness, numbness and headaches.  A complete 10 system review of systems was obtained and all systems are negative except as noted in the HPI and PMH.      Allergies  Nsaids  Home Medications   Prior to Admission medications   Medication Sig Start Date End Date Taking? Authorizing Provider  hydrochlorothiazide (MICROZIDE) 12.5 MG capsule Take 12.5 mg by mouth daily.   Yes Historical Provider, MD  levothyroxine (SYNTHROID, LEVOTHROID) 125 MCG tablet Take 125 mcg by mouth daily before breakfast.   Yes Historical Provider, MD  omeprazole (PRILOSEC) 20 MG capsule Take 1 capsule (20 mg total) by mouth daily. 07/26/14   Glynn Octave, MD  ondansetron (ZOFRAN) 4 MG tablet Take 1 tablet (4 mg total) by mouth every 6 (six) hours. 07/26/14   Glynn Octave, MD   BP 144/76  Pulse 64  Temp(Src) 97.9 F (36.6 C) (Oral)  Resp 17  SpO2 99% Physical Exam  Nursing note and vitals reviewed. Constitutional: She is oriented to person, place, and time. She appears well-developed and well-nourished. No distress.  Anxious appearing  HENT:  Head: Normocephalic and  atraumatic.  Mouth/Throat: Oropharynx is clear and moist. No oropharyngeal exudate.  Eyes: Conjunctivae and EOM are normal. Pupils are equal, round, and reactive to light.  Neck: Normal range of motion. Neck supple.  No meningismus.  Cardiovascular: Normal rate, regular rhythm, normal heart sounds and intact distal pulses.   No murmur heard. Pulmonary/Chest: Effort normal and breath sounds normal. No respiratory distress. She exhibits no tenderness.  Abdominal: Soft. There is no tenderness. There is  no rebound and no guarding.  Musculoskeletal: Normal range of motion. She exhibits no edema and no tenderness.  Neurological: She is alert and oriented to person, place, and time. No cranial nerve deficit. She exhibits normal muscle tone. Coordination normal.  No ataxia on finger to nose bilaterally. No pronator drift. 5/5 strength throughout. CN 2-12 intact. Negative Romberg. Equal grip strength. Sensation intact. Gait is normal.   Skin: Skin is warm.  Psychiatric: She has a normal mood and affect. Her behavior is normal.    ED Course  Procedures (including critical care time) Labs Review Labs Reviewed  URINALYSIS, ROUTINE W REFLEX MICROSCOPIC - Abnormal; Notable for the following:    Leukocytes, UA SMALL (*)    All other components within normal limits  CBC - Abnormal; Notable for the following:    WBC 15.3 (*)    All other components within normal limits  BASIC METABOLIC PANEL - Abnormal; Notable for the following:    Glucose, Bld 103 (*)    Anion gap 16 (*)    All other components within normal limits  HEPATIC FUNCTION PANEL - Abnormal; Notable for the following:    Total Bilirubin 0.2 (*)    All other components within normal limits  URINE CULTURE  LIPASE, BLOOD  D-DIMER, QUANTITATIVE  URINE MICROSCOPIC-ADD ON  TROPONIN I  Rosezena Sensor, ED    Imaging Review Dg Chest Port 1 View  07/26/2014   CLINICAL DATA:  Some shortness of breath and mid chest discomfort.  EXAM: PORTABLE CHEST - 1 VIEW  COMPARISON:  None.  FINDINGS: Heart, mediastinum and hila are unremarkable. Lungs are clear. No pleural effusion or pneumothorax. Bony thorax is intact.  IMPRESSION: No active disease.   Electronically Signed   By: Amie Portland M.D.   On: 07/26/2014 11:19   US Abdomen Limited Ruq  07/26/2014   CLINICAL DATA:  Epigastric pain.  EXAM: US ABDOMEN LIMITED - RIGHT UPPER QUADRANT  COMPARISON:  None.  FINDINGS: Gallbladder:  No gallstones or wall thickening visualized. No sonographic Murphy  sign noted.  Common bile duct:  Diameter: 0.3 cm.  Liver:  No focal lesion identified. Within normal limits in parenchymal echogenicity.  IMPRESSION: Negative for gallstones.  Negative exam.   Electronically Signed   By: Drusilla Kanner M.D.   On: 07/26/2014 13:24     EKG Interpretation   Date/Time:  Thursday July 26 2014 14:04:22 EDT Ventricular Rate:  59 PR Interval:  178 QRS Duration: 102 QT Interval:  404 QTC Calculation: 400 R Axis:   -50 Text Interpretation:  Sinus rhythm LAD, consider left anterior fascicular  block Low voltage, precordial leads RSR' in V1 or V2, right VCD or RVH T  wave inversion lead III No significant change was found Confirmed by  Manus Gunning  MD, Arlethia Basso [phone]) on 07/26/2014 2:15:34 PM      MDM   Final diagnoses:  Chest pain, unspecified chest pain type  Dysuria   "Heartburn" since 2 AM associated with some shortness of breath. EKG normal sinus rhythm with  left anterior fascicular block. No previous EKG available. No history of heart problems.  Abdomen is soft and nontender.  Patient reports no significant history of heartburn or history of heart problems. Pain has been constant since 2 AM. We'll check troponin, labs.  UA not impressive for infection. Troponin negative. Patient still concerned for UTI.  Will send culture. WBC 15.  LFTs and lipase normal. Ultrasound shows no gallstones.  Pain is improved with GI cocktail. EKG unchanged. Troponin negative x2. D-dimer negative. Low suspicion for cardiac etiology of pain but the patient would benefit from outpatient stress test.  Observation admission offered which patient declines. Suspect GI etiology of pain.  It is worse with drinking, laying flat. Will start PPI. Follow up with PCP for stress test.   Glynn Octave, MD 07/26/14 1520

## 2014-07-26 NOTE — ED Notes (Addendum)
Initial contact-A&Ox4. Ambulatory and moving all extremities. C/o posterior neck stiffness and heartburn with pain in lower back. Has full neck ROM. Denies HA. Denies pain at this time. Denies chest pain. Has never had this type of pain before. C/o SOB. Heartburn relieved with 2 Tums but exacerbated with water. Hx hyperthyroidism. Denies fever, cough. In NAD. MD Rancour at bedside.

## 2014-07-27 LAB — URINE CULTURE
Colony Count: NO GROWTH
Culture: NO GROWTH

## 2014-10-31 ENCOUNTER — Other Ambulatory Visit: Payer: Self-pay | Admitting: Internal Medicine

## 2014-12-13 ENCOUNTER — Other Ambulatory Visit: Payer: Self-pay | Admitting: Internal Medicine

## 2016-01-09 DIAGNOSIS — G8929 Other chronic pain: Secondary | ICD-10-CM | POA: Insufficient documentation

## 2016-01-09 DIAGNOSIS — M25562 Pain in left knee: Secondary | ICD-10-CM | POA: Insufficient documentation

## 2016-01-16 ENCOUNTER — Other Ambulatory Visit: Payer: Self-pay | Admitting: Orthopedic Surgery

## 2016-02-05 NOTE — Pre-Procedure Instructions (Signed)
    Preston FleetingLaurie E Macdougal  02/05/2016      [facility] [address]- [place], [place] - [address] [place] [id number] Phone: [phone] Fax: [phone]    Your procedure is scheduled on Monday, March 20th, 2017.  [facility] Admitting at 5:30 A.M.  Call this number if you have problems the morning of surgery:  [phone]   Remember:  Do not eat food or drink liquids after midnight.   Take these medicines the morning of surgery with A SIP OF WATER: Levothyroxine (Synthroid).  7 days prior to surgery, stop taking: Meloxicam (Mobic), Aspirin, NSAIDS, Aleve, Naproxen, Ibuprofen, Advil, Motrin, BC's, Goody's, Fish oil, all herbal medications, and all vitamins.    Do not wear jewelry, make-up or nail polish.  Do not wear lotions, powders, or perfumes.    Do not shave 48 hours prior to surgery.    Do not bring valuables to the hospital.   [facility] is not responsible for any belongings or valuables.  Contacts, dentures or bridgework may not be worn into surgery.  Leave your suitcase in the car.  After surgery it may be brought to your room.  For patients admitted to the hospital, discharge time will be determined by your treatment team.  Patients discharged the day of surgery will not be allowed to drive home.   Special instructions:  See attached.   Please read over the following fact sheets that you were given. Pain Booklet, Coughing and Deep Breathing, Total Joint Packet, MRSA Information and Surgical Site Infection Prevention

## 2016-02-06 ENCOUNTER — Ambulatory Visit (HOSPITAL_COMMUNITY)
Admission: RE | Admit: 2016-02-06 | Discharge: 2016-02-06 | Disposition: A | Discharge status: Home / Self Care | Payer: BLUE CROSS/BLUE SHIELD | Source: Ambulatory Visit | Attending: Orthopedic Surgery | Admitting: Orthopedic Surgery

## 2016-02-06 ENCOUNTER — Encounter (HOSPITAL_COMMUNITY)
Admission: RE | Admit: 2016-02-06 | Discharge: 2016-02-06 | Disposition: A | Discharge status: Home / Self Care | Payer: BLUE CROSS/BLUE SHIELD | Source: Ambulatory Visit | Attending: Orthopedic Surgery | Admitting: Orthopedic Surgery

## 2016-02-06 ENCOUNTER — Encounter (HOSPITAL_COMMUNITY): Payer: Self-pay

## 2016-02-06 DIAGNOSIS — Z01818 Encounter for other preprocedural examination: Secondary | ICD-10-CM | POA: Insufficient documentation

## 2016-02-06 DIAGNOSIS — Z01812 Encounter for preprocedural laboratory examination: Secondary | ICD-10-CM | POA: Insufficient documentation

## 2016-02-06 HISTORY — DX: Unspecified osteoarthritis, unspecified site: M19.90

## 2016-02-06 LAB — COMPREHENSIVE METABOLIC PANEL
ALT: 16 U/L (ref 14–54)
ANION GAP: 12 (ref 5–15)
AST: 19 U/L (ref 15–41)
Albumin: 3.8 g/dL (ref 3.5–5.0)
Alkaline Phosphatase: 69 U/L (ref 38–126)
BUN: 16 mg/dL (ref 6–20)
CHLORIDE: 101 mmol/L (ref 101–111)
CO2: 27 mmol/L (ref 22–32)
CREATININE: 0.64 mg/dL (ref 0.44–1.00)
Calcium: 9.5 mg/dL (ref 8.9–10.3)
Glucose, Bld: 122 mg/dL — ABNORMAL HIGH (ref 65–99)
POTASSIUM: 4 mmol/L (ref 3.5–5.1)
SODIUM: 140 mmol/L (ref 135–145)
Total Bilirubin: 0.5 mg/dL (ref 0.3–1.2)
Total Protein: 7.5 g/dL (ref 6.5–8.1)

## 2016-02-06 LAB — PROTIME-INR
INR: 1.08 (ref 0.00–1.49)
PROTHROMBIN TIME: 14.2 s (ref 11.6–15.2)

## 2016-02-06 LAB — SURGICAL PCR SCREEN
MRSA, PCR: NEGATIVE
STAPHYLOCOCCUS AUREUS: POSITIVE — AB

## 2016-02-06 LAB — CBC WITH DIFFERENTIAL/PLATELET
Basophils Absolute: 0 10*3/uL (ref 0.0–0.1)
Basophils Relative: 0 %
EOS ABS: 0.4 10*3/uL (ref 0.0–0.7)
Eosinophils Relative: 3 %
HCT: 43.9 % (ref 36.0–46.0)
Hemoglobin: 14.8 g/dL (ref 12.0–15.0)
LYMPHS ABS: 4 10*3/uL (ref 0.7–4.0)
Lymphocytes Relative: 27 %
MCH: 31.2 pg (ref 26.0–34.0)
MCHC: 33.7 g/dL (ref 30.0–36.0)
MCV: 92.6 fL (ref 78.0–100.0)
MONOS PCT: 5 %
Monocytes Absolute: 0.8 10*3/uL (ref 0.1–1.0)
NEUTROS PCT: 65 %
Neutro Abs: 9.4 10*3/uL — ABNORMAL HIGH (ref 1.7–7.7)
PLATELETS: 352 10*3/uL (ref 150–400)
RBC: 4.74 MIL/uL (ref 3.87–5.11)
RDW: 13.8 % (ref 11.5–15.5)
WBC: 14.6 10*3/uL — ABNORMAL HIGH (ref 4.0–10.5)

## 2016-02-06 LAB — URINALYSIS, ROUTINE W REFLEX MICROSCOPIC
Bilirubin Urine: NEGATIVE
GLUCOSE, UA: NEGATIVE mg/dL
Hgb urine dipstick: NEGATIVE
KETONES UR: NEGATIVE mg/dL
LEUKOCYTES UA: NEGATIVE
NITRITE: NEGATIVE
PROTEIN: NEGATIVE mg/dL
Specific Gravity, Urine: 1.022 (ref 1.005–1.030)
pH: 7 (ref 5.0–8.0)

## 2016-02-06 LAB — APTT: aPTT: 32 seconds (ref 24–37)

## 2016-02-07 LAB — URINE CULTURE: Culture: NO GROWTH

## 2016-02-07 NOTE — Progress Notes (Signed)
Anesthesia Chart Review:  Pt is a 60 year old female scheduled for L total knee arthoplasty on 02/17/2016 with Dr. Sherlean FootLucey.   PMH includes:  HTN, thyroid disease. Current smoker. BMI 28  Medciations include: hctz, levothyroxine  Preoperative labs reviewed.  WBC 14.6.   Chest x-ray 02/06/16 reviewed. No acute cardiopulmonary disease.   EKG was requested from PCP's office, but they do not have one. Will obtain EKG DOS.   Spoke with pt by telephone. She denies feeling sick in any way. Denies toothache, urinary sx, URI sx, cough, fever, rash. Instructed pt to see PCP if she begins feeling ill.   If EKG acceptable DOS, I anticipate pt can proceed as scheduled.   Rica Mastngela Kabbe, FNP-BC [facility]Anesthesiology Phone: [phone] 02/07/2016 12:45 PM

## 2016-02-14 MED ORDER — SODIUM CHLORIDE 0.9 % IV SOLN
INTRAVENOUS | Status: DC
Start: 2016-02-17 — End: 2016-02-17

## 2016-02-14 MED ORDER — TRANEXAMIC ACID 1000 MG/10ML IV SOLN
1000.0000 mg | INTRAVENOUS | Status: AC
  Administered 2016-02-17: 1000 mg via INTRAVENOUS
  Filled 2016-02-14: qty 10

## 2016-02-14 MED ORDER — BUPIVACAINE LIPOSOME 1.3 % IJ SUSP
20.0000 mL | Freq: Once | INTRAMUSCULAR | Status: AC
  Administered 2016-02-17: 20 mL
  Filled 2016-02-14: qty 20

## 2016-02-14 MED ORDER — CEFAZOLIN SODIUM-DEXTROSE 2-3 GM-% IV SOLR
2.0000 g | INTRAVENOUS | Status: AC
  Administered 2016-02-17: 2 g via INTRAVENOUS
  Filled 2016-02-14: qty 50

## 2016-02-17 ENCOUNTER — Encounter (HOSPITAL_COMMUNITY)
Admission: RE | Disposition: A | Discharge status: Home Health | Payer: Self-pay | Source: Ambulatory Visit | Attending: Orthopedic Surgery

## 2016-02-17 ENCOUNTER — Encounter (HOSPITAL_COMMUNITY): Payer: Self-pay | Admitting: Critical Care Medicine

## 2016-02-17 ENCOUNTER — Inpatient Hospital Stay (HOSPITAL_COMMUNITY): Payer: BLUE CROSS/BLUE SHIELD | Admitting: Critical Care Medicine

## 2016-02-17 ENCOUNTER — Inpatient Hospital Stay (HOSPITAL_COMMUNITY): Payer: BLUE CROSS/BLUE SHIELD | Admitting: Emergency Medicine

## 2016-02-17 ENCOUNTER — Inpatient Hospital Stay (HOSPITAL_COMMUNITY)
Admission: RE | Admit: 2016-02-17 | Discharge: 2016-02-19 | DRG: 470 | Disposition: A | Discharge status: Home Health | Payer: BLUE CROSS/BLUE SHIELD | Source: Ambulatory Visit | Attending: Orthopedic Surgery | Admitting: Orthopedic Surgery

## 2016-02-17 DIAGNOSIS — Z791 Long term (current) use of non-steroidal anti-inflammatories (NSAID): Secondary | ICD-10-CM

## 2016-02-17 DIAGNOSIS — E781 Pure hyperglyceridemia: Secondary | ICD-10-CM | POA: Diagnosis present

## 2016-02-17 DIAGNOSIS — I10 Essential (primary) hypertension: Secondary | ICD-10-CM | POA: Diagnosis present

## 2016-02-17 DIAGNOSIS — F17200 Nicotine dependence, unspecified, uncomplicated: Secondary | ICD-10-CM | POA: Diagnosis present

## 2016-02-17 DIAGNOSIS — M1712 Unilateral primary osteoarthritis, left knee: Secondary | ICD-10-CM | POA: Diagnosis present

## 2016-02-17 DIAGNOSIS — M25562 Pain in left knee: Secondary | ICD-10-CM | POA: Diagnosis present

## 2016-02-17 DIAGNOSIS — Z79899 Other long term (current) drug therapy: Secondary | ICD-10-CM

## 2016-02-17 DIAGNOSIS — E039 Hypothyroidism, unspecified: Secondary | ICD-10-CM | POA: Diagnosis present

## 2016-02-17 DIAGNOSIS — Z96659 Presence of unspecified artificial knee joint: Secondary | ICD-10-CM

## 2016-02-17 HISTORY — PX: TOTAL KNEE ARTHROPLASTY: SHX125

## 2016-02-17 LAB — CBC
HEMATOCRIT: 40.1 % (ref 36.0–46.0)
HEMOGLOBIN: 13 g/dL (ref 12.0–15.0)
MCH: 29.9 pg (ref 26.0–34.0)
MCHC: 32.4 g/dL (ref 30.0–36.0)
MCV: 92.2 fL (ref 78.0–100.0)
Platelets: 312 10*3/uL (ref 150–400)
RBC: 4.35 MIL/uL (ref 3.87–5.11)
RDW: 13.8 % (ref 11.5–15.5)
WBC: 16.5 10*3/uL — AB (ref 4.0–10.5)

## 2016-02-17 LAB — CREATININE, SERUM: Creatinine, Ser: 0.66 mg/dL (ref 0.44–1.00)

## 2016-02-17 SURGERY — ARTHROPLASTY, KNEE, TOTAL
Anesthesia: Monitor Anesthesia Care | Laterality: Left

## 2016-02-17 MED ORDER — BUPIVACAINE-EPINEPHRINE (PF) 0.5% -1:200000 IJ SOLN
INTRAMUSCULAR | Status: AC
  Filled 2016-02-17: qty 30

## 2016-02-17 MED ORDER — ENOXAPARIN SODIUM 40 MG/0.4ML ~~LOC~~ SOLN
40.0000 mg | Freq: Two times a day (BID) | SUBCUTANEOUS | Status: DC
  Administered 2016-02-18: 40 mg via SUBCUTANEOUS
  Filled 2016-02-17: qty 0.4

## 2016-02-17 MED ORDER — METHOCARBAMOL 1000 MG/10ML IJ SOLN
500.0000 mg | Freq: Four times a day (QID) | INTRAVENOUS | Status: DC | PRN
  Filled 2016-02-17: qty 5

## 2016-02-17 MED ORDER — BUPIVACAINE-EPINEPHRINE (PF) 0.25% -1:200000 IJ SOLN
INTRAMUSCULAR | Status: AC
  Filled 2016-02-17: qty 30

## 2016-02-17 MED ORDER — CEFAZOLIN SODIUM 1-5 GM-% IV SOLN
1.0000 g | Freq: Four times a day (QID) | INTRAVENOUS | Status: AC
  Administered 2016-02-17 (×2): 1 g via INTRAVENOUS
  Filled 2016-02-17 (×2): qty 50

## 2016-02-17 MED ORDER — METOCLOPRAMIDE HCL 5 MG/ML IJ SOLN
5.0000 mg | Freq: Three times a day (TID) | INTRAMUSCULAR | Status: DC | PRN
  Administered 2016-02-17: 10 mg via INTRAVENOUS
  Filled 2016-02-17: qty 2

## 2016-02-17 MED ORDER — SODIUM CHLORIDE 0.9 % IV SOLN
INTRAVENOUS | Status: DC
  Administered 2016-02-17 – 2016-02-18 (×2): via INTRAVENOUS

## 2016-02-17 MED ORDER — ACETAMINOPHEN 650 MG RE SUPP: 650.0000 mg | Freq: Four times a day (QID) | RECTAL | Status: DC | PRN

## 2016-02-17 MED ORDER — PHENYLEPHRINE HCL 10 MG/ML IJ SOLN
10.0000 mg | INTRAVENOUS | Status: DC | PRN
  Administered 2016-02-17: 20 ug/min via INTRAVENOUS

## 2016-02-17 MED ORDER — HYDROMORPHONE HCL 1 MG/ML IJ SOLN
0.5000 mg | INTRAMUSCULAR | Status: DC | PRN
  Administered 2016-02-17 – 2016-02-18 (×5): 0.5 mg via INTRAVENOUS
  Filled 2016-02-17 (×5): qty 1

## 2016-02-17 MED ORDER — BISACODYL 5 MG PO TBEC: 5.0000 mg | DELAYED_RELEASE_TABLET | Freq: Every day | ORAL | Status: DC | PRN

## 2016-02-17 MED ORDER — SODIUM CHLORIDE 0.9 % IR SOLN
Status: DC | PRN
  Administered 2016-02-17: 1000 mL

## 2016-02-17 MED ORDER — SUCCINYLCHOLINE CHLORIDE 20 MG/ML IJ SOLN
INTRAMUSCULAR | Status: AC
  Filled 2016-02-17: qty 1

## 2016-02-17 MED ORDER — OXYCODONE HCL ER 10 MG PO T12A
10.0000 mg | EXTENDED_RELEASE_TABLET | Freq: Two times a day (BID) | ORAL | Status: DC
  Administered 2016-02-17 – 2016-02-18 (×4): 10 mg via ORAL
  Filled 2016-02-17 (×5): qty 1

## 2016-02-17 MED ORDER — MENTHOL 3 MG MT LOZG: 1.0000 | LOZENGE | OROMUCOSAL | Status: DC | PRN

## 2016-02-17 MED ORDER — MIDAZOLAM HCL 5 MG/5ML IJ SOLN
INTRAMUSCULAR | Status: DC | PRN
  Administered 2016-02-17: 2 mg via INTRAVENOUS

## 2016-02-17 MED ORDER — DIPHENHYDRAMINE HCL 12.5 MG/5ML PO ELIX
12.5000 mg | ORAL_SOLUTION | ORAL | Status: DC | PRN
  Administered 2016-02-17: 12.5 mg via ORAL
  Filled 2016-02-17: qty 10

## 2016-02-17 MED ORDER — HYDROCHLOROTHIAZIDE 12.5 MG PO CAPS
12.5000 mg | ORAL_CAPSULE | Freq: Every day | ORAL | Status: DC
  Administered 2016-02-18 – 2016-02-19 (×2): 12.5 mg via ORAL
  Filled 2016-02-17 (×2): qty 1

## 2016-02-17 MED ORDER — LEVOTHYROXINE SODIUM 25 MCG PO TABS
125.0000 ug | ORAL_TABLET | Freq: Every day | ORAL | Status: DC
  Administered 2016-02-18 – 2016-02-19 (×2): 125 ug via ORAL
  Filled 2016-02-17 (×2): qty 1

## 2016-02-17 MED ORDER — ACETAMINOPHEN 325 MG PO TABS: 650.0000 mg | ORAL_TABLET | Freq: Four times a day (QID) | ORAL | Status: DC | PRN

## 2016-02-17 MED ORDER — CELECOXIB 200 MG PO CAPS
200.0000 mg | ORAL_CAPSULE | Freq: Two times a day (BID) | ORAL | Status: DC
  Administered 2016-02-17 – 2016-02-19 (×4): 200 mg via ORAL
  Filled 2016-02-17 (×4): qty 1

## 2016-02-17 MED ORDER — DOCUSATE SODIUM 100 MG PO CAPS
100.0000 mg | ORAL_CAPSULE | Freq: Two times a day (BID) | ORAL | Status: DC
  Administered 2016-02-17 – 2016-02-19 (×5): 100 mg via ORAL
  Filled 2016-02-17 (×5): qty 1

## 2016-02-17 MED ORDER — EPHEDRINE SULFATE 50 MG/ML IJ SOLN
INTRAMUSCULAR | Status: AC
  Filled 2016-02-17: qty 1

## 2016-02-17 MED ORDER — TRANEXAMIC ACID 1000 MG/10ML IV SOLN
1000.0000 mg | Freq: Once | INTRAVENOUS | Status: AC
  Administered 2016-02-17: 1000 mg via INTRAVENOUS
  Filled 2016-02-17: qty 10

## 2016-02-17 MED ORDER — ALUM & MAG HYDROXIDE-SIMETH 200-200-20 MG/5ML PO SUSP: 30.0000 mL | ORAL | Status: DC | PRN

## 2016-02-17 MED ORDER — MEPERIDINE HCL 25 MG/ML IJ SOLN: 6.2500 mg | INTRAMUSCULAR | Status: DC | PRN

## 2016-02-17 MED ORDER — LACTATED RINGERS IV SOLN
INTRAVENOUS | Status: DC | PRN
  Administered 2016-02-17 (×2): via INTRAVENOUS

## 2016-02-17 MED ORDER — ONDANSETRON HCL 4 MG/2ML IJ SOLN
INTRAMUSCULAR | Status: DC | PRN
  Administered 2016-02-17: 4 mg via INTRAVENOUS

## 2016-02-17 MED ORDER — METOCLOPRAMIDE HCL 5 MG PO TABS: 5.0000 mg | ORAL_TABLET | Freq: Three times a day (TID) | ORAL | Status: DC | PRN

## 2016-02-17 MED ORDER — PROPOFOL 10 MG/ML IV BOLUS
INTRAVENOUS | Status: AC
  Filled 2016-02-17: qty 20

## 2016-02-17 MED ORDER — HYDROMORPHONE HCL 1 MG/ML IJ SOLN: 0.2500 mg | INTRAMUSCULAR | Status: DC | PRN

## 2016-02-17 MED ORDER — 0.9 % SODIUM CHLORIDE (POUR BTL) OPTIME
TOPICAL | Status: DC | PRN
  Administered 2016-02-17: 1000 mL

## 2016-02-17 MED ORDER — LIDOCAINE HCL (CARDIAC) 20 MG/ML IV SOLN
INTRAVENOUS | Status: AC
  Filled 2016-02-17: qty 5

## 2016-02-17 MED ORDER — MIDAZOLAM HCL 2 MG/2ML IJ SOLN
INTRAMUSCULAR | Status: AC
  Filled 2016-02-17: qty 2

## 2016-02-17 MED ORDER — PROPOFOL 10 MG/ML IV BOLUS
INTRAVENOUS | Status: DC | PRN
  Administered 2016-02-17: 20 mg via INTRAVENOUS

## 2016-02-17 MED ORDER — ONDANSETRON HCL 4 MG/2ML IJ SOLN: 4.0000 mg | Freq: Once | INTRAMUSCULAR | Status: DC | PRN

## 2016-02-17 MED ORDER — SODIUM CHLORIDE 0.9 % IJ SOLN
INTRAMUSCULAR | Status: DC | PRN
  Administered 2016-02-17 (×2): 10 mL via INTRAVENOUS

## 2016-02-17 MED ORDER — PROPOFOL 500 MG/50ML IV EMUL
INTRAVENOUS | Status: DC | PRN
  Administered 2016-02-17: 50 ug/kg/min via INTRAVENOUS

## 2016-02-17 MED ORDER — SENNOSIDES-DOCUSATE SODIUM 8.6-50 MG PO TABS: 1.0000 | ORAL_TABLET | Freq: Every evening | ORAL | Status: DC | PRN

## 2016-02-17 MED ORDER — SODIUM CHLORIDE 0.9 % IJ SOLN
INTRAMUSCULAR | Status: AC
  Filled 2016-02-17: qty 10

## 2016-02-17 MED ORDER — FLEET ENEMA 7-19 GM/118ML RE ENEM: 1.0000 | ENEMA | Freq: Once | RECTAL | Status: DC | PRN

## 2016-02-17 MED ORDER — ZOLPIDEM TARTRATE 5 MG PO TABS: 5.0000 mg | ORAL_TABLET | Freq: Every evening | ORAL | Status: DC | PRN

## 2016-02-17 MED ORDER — FENTANYL CITRATE (PF) 250 MCG/5ML IJ SOLN
INTRAMUSCULAR | Status: AC
  Filled 2016-02-17: qty 5

## 2016-02-17 MED ORDER — ONDANSETRON HCL 4 MG PO TABS
4.0000 mg | ORAL_TABLET | Freq: Four times a day (QID) | ORAL | Status: DC | PRN
  Administered 2016-02-17 – 2016-02-18 (×2): 4 mg via ORAL
  Filled 2016-02-17 (×2): qty 1

## 2016-02-17 MED ORDER — OXYCODONE HCL 5 MG PO TABS
5.0000 mg | ORAL_TABLET | ORAL | Status: DC | PRN
  Administered 2016-02-17 – 2016-02-18 (×6): 10 mg via ORAL
  Administered 2016-02-19: 5 mg via ORAL
  Filled 2016-02-17 (×7): qty 2

## 2016-02-17 MED ORDER — ONDANSETRON HCL 4 MG/2ML IJ SOLN
4.0000 mg | Freq: Four times a day (QID) | INTRAMUSCULAR | Status: DC | PRN
  Administered 2016-02-18: 4 mg via INTRAVENOUS
  Filled 2016-02-17: qty 2

## 2016-02-17 MED ORDER — PHENOL 1.4 % MT LIQD: 1.0000 | OROMUCOSAL | Status: DC | PRN

## 2016-02-17 MED ORDER — METHOCARBAMOL 500 MG PO TABS
500.0000 mg | ORAL_TABLET | Freq: Four times a day (QID) | ORAL | Status: DC | PRN
  Administered 2016-02-17 – 2016-02-19 (×5): 500 mg via ORAL
  Filled 2016-02-17 (×5): qty 1

## 2016-02-17 MED ORDER — FENTANYL CITRATE (PF) 100 MCG/2ML IJ SOLN
INTRAMUSCULAR | Status: DC | PRN
  Administered 2016-02-17 (×2): 25 ug via INTRAVENOUS

## 2016-02-17 MED ORDER — BUPIVACAINE-EPINEPHRINE (PF) 0.25% -1:200000 IJ SOLN
INTRAMUSCULAR | Status: DC | PRN
  Administered 2016-02-17: 30 mL

## 2016-02-17 SURGICAL SUPPLY — 60 items
BANDAGE ESMARK 6X9 LF (GAUZE/BANDAGES/DRESSINGS) ×1 IMPLANT
BLADE SAGITTAL 13X1.27X60 (BLADE) ×2 IMPLANT
BLADE SAW SGTL 83.5X18.5 (BLADE) ×2 IMPLANT
BLADE SURG 10 STRL SS (BLADE) ×2 IMPLANT
BNDG ESMARK 6X9 LF (GAUZE/BANDAGES/DRESSINGS) ×2
BOWL SMART MIX CTS (DISPOSABLE) ×2 IMPLANT
CAPT KNEE TOTAL 3 ×2 IMPLANT
CEMENT BONE SIMPLEX SPEEDSET (Cement) ×4 IMPLANT
COVER SURGICAL LIGHT HANDLE (MISCELLANEOUS) ×2 IMPLANT
CUFF TOURNIQUET SINGLE 34IN LL (TOURNIQUET CUFF) ×2 IMPLANT
DRAPE EXTREMITY T 121X128X90 (DRAPE) ×2 IMPLANT
DRAPE INCISE IOBAN 66X45 STRL (DRAPES) ×4 IMPLANT
DRAPE PROXIMA HALF (DRAPES) IMPLANT
DRAPE U-SHAPE 47X51 STRL (DRAPES) ×2 IMPLANT
DRSG ADAPTIC 3X8 NADH LF (GAUZE/BANDAGES/DRESSINGS) ×2 IMPLANT
DRSG PAD ABDOMINAL 8X10 ST (GAUZE/BANDAGES/DRESSINGS) ×2 IMPLANT
DURAPREP 26ML APPLICATOR (WOUND CARE) ×4 IMPLANT
ELECT REM PT RETURN 9FT ADLT (ELECTROSURGICAL) ×2
ELECTRODE REM PT RTRN 9FT ADLT (ELECTROSURGICAL) ×1 IMPLANT
GAUZE SPONGE 4X4 12PLY STRL (GAUZE/BANDAGES/DRESSINGS) ×2 IMPLANT
GLOVE BIOGEL M 7.0 STRL (GLOVE) IMPLANT
GLOVE BIOGEL PI IND STRL 7.5 (GLOVE) IMPLANT
GLOVE BIOGEL PI IND STRL 8.5 (GLOVE) ×5 IMPLANT
GLOVE BIOGEL PI INDICATOR 7.5 (GLOVE)
GLOVE BIOGEL PI INDICATOR 8.5 (GLOVE) ×5
GLOVE SURG ORTHO 8.0 STRL STRW (GLOVE) ×12 IMPLANT
GOWN STRL REUS W/ TWL LRG LVL3 (GOWN DISPOSABLE) ×1 IMPLANT
GOWN STRL REUS W/ TWL XL LVL3 (GOWN DISPOSABLE) ×2 IMPLANT
GOWN STRL REUS W/TWL 2XL LVL3 (GOWN DISPOSABLE) ×2 IMPLANT
GOWN STRL REUS W/TWL LRG LVL3 (GOWN DISPOSABLE) ×1
GOWN STRL REUS W/TWL XL LVL3 (GOWN DISPOSABLE) ×2
HANDPIECE INTERPULSE COAX TIP (DISPOSABLE) ×1
HOOD PEEL AWAY FACE SHEILD DIS (HOOD) ×6 IMPLANT
KIT BASIN OR (CUSTOM PROCEDURE TRAY) ×2 IMPLANT
KIT ROOM TURNOVER OR (KITS) ×2 IMPLANT
KNEE CAPITATED TOTAL 3 ×1 IMPLANT
MANIFOLD NEPTUNE II (INSTRUMENTS) ×2 IMPLANT
NEEDLE 22X1 1/2 (OR ONLY) (NEEDLE) ×4 IMPLANT
NS IRRIG 1000ML POUR BTL (IV SOLUTION) ×2 IMPLANT
PACK TOTAL JOINT (CUSTOM PROCEDURE TRAY) ×2 IMPLANT
PACK UNIVERSAL I (CUSTOM PROCEDURE TRAY) ×2 IMPLANT
PAD ARMBOARD 7.5X6 YLW CONV (MISCELLANEOUS) ×4 IMPLANT
PADDING CAST ABS 6INX4YD NS (CAST SUPPLIES) ×1
PADDING CAST ABS COTTON 6X4 NS (CAST SUPPLIES) ×1 IMPLANT
PADDING CAST COTTON 6X4 STRL (CAST SUPPLIES) ×2 IMPLANT
SET HNDPC FAN SPRY TIP SCT (DISPOSABLE) ×1 IMPLANT
SPONGE GAUZE 4X4 12PLY STER LF (GAUZE/BANDAGES/DRESSINGS) ×2 IMPLANT
STAPLER VISISTAT 35W (STAPLE) ×2 IMPLANT
SUCTION FRAZIER HANDLE 10FR (MISCELLANEOUS) ×1
SUCTION TUBE FRAZIER 10FR DISP (MISCELLANEOUS) ×1 IMPLANT
SUT BONE WAX W31G (SUTURE) ×2 IMPLANT
SUT VIC AB 0 CTB1 27 (SUTURE) ×4 IMPLANT
SUT VIC AB 1 CT1 27 (SUTURE) ×2
SUT VIC AB 1 CT1 27XBRD ANBCTR (SUTURE) ×2 IMPLANT
SUT VIC AB 2-0 CT1 27 (SUTURE) ×2
SUT VIC AB 2-0 CT1 TAPERPNT 27 (SUTURE) ×2 IMPLANT
SYR 20CC LL (SYRINGE) ×4 IMPLANT
TOWEL OR 17X24 6PK STRL BLUE (TOWEL DISPOSABLE) ×2 IMPLANT
TOWEL OR 17X26 10 PK STRL BLUE (TOWEL DISPOSABLE) ×2 IMPLANT
WATER STERILE IRR 1000ML POUR (IV SOLUTION) IMPLANT

## 2016-02-17 NOTE — Progress Notes (Signed)
Pt in severe pain shortly after arriving to floor. Crying and being "all over the bed". Tolerated CPM for 2 hours, did not tolerate grey foam foot rest. Meds given per MAR. Pt resting at this time - arousable and states she is comfortable. [facility]

## 2016-02-17 NOTE — Progress Notes (Signed)
Pt arrived to floor from pacu at 12:15. cpm on-pt unable to tolerate at 90 deg-reduced to 75-pt states tolerable. Will adjust back later. Pt c/o severe pain upon arrival to floor. meds given per mar. Assessment and VS per flowsheet.

## 2016-02-17 NOTE — Progress Notes (Signed)
Orthopedic Tech Progress Note Patient Details:  Tiffany Navarro 06/15/1956 [id number]  Patient ID: Tiffany Navarro, female   DOB: 04/20/1956, 60 y.o.   MRN: [id number] Couldn't put pt in cpm due to much pain. Pt couldn't even stand to have me move the leg   [name], [name] 02/17/2016, 8:05 PM

## 2016-02-17 NOTE — Progress Notes (Signed)
Orthopedic Tech Progress Note Patient Details:  Tiffany Navarro 04/29/1956 [id number]  CPM Left Knee CPM Left Knee: On Left Knee Flexion (Degrees): 90 Left Knee Extension (Degrees): 0 Additional Comments: Trapeze bar and foot roll   Saul FordyceJennifer C Yahira Timberman 02/17/2016, 11:12 AM

## 2016-02-17 NOTE — H&P (Signed)
Tiffany Navarro MRN:  [id number] DOB/SEX:  [birth year]/female  CHIEF COMPLAINT:  Painful left Knee  HISTORY: Patient is a 60 y.o. female presented with a history of pain in the left knee. Onset of symptoms was gradual starting several years ago with gradually worsening course since that time. Patient has been treated conservatively with over-the-counter NSAIDs and activity modification. Patient currently rates pain in the knee at 10 out of 10 with activity. There is pain at night.  PAST MEDICAL HISTORY: Patient Active Problem List   Diagnosis Date Noted  . Hypertriglyceridemia 05/30/2014  . Unspecified hypothyroidism 01/22/2014  . Essential hypertension, benign 01/22/2014  . Osteoarthritis of left knee 01/22/2014   Past Medical History  Diagnosis Date  . Thyroid disease   . Hypertension   . Osteoarthritis    Past Surgical History  Procedure Laterality Date  . Hip pinning Left   . Back surgery       MEDICATIONS:   Prescriptions prior to admission  Medication Sig Dispense Refill Last Dose  . hydrochlorothiazide (HYDRODIURIL) 25 MG tablet Take 25 mg by mouth daily.   02/16/2016 at Unknown time  . levothyroxine (SYNTHROID, LEVOTHROID) 125 MCG tablet TAKE ONE TABLET BY MOUTH ONCE DAILY BEFORE  BREAKFAST 30 tablet 0 02/17/2016 at 0400  . meloxicam (MOBIC) 15 MG tablet Take 15 mg by mouth daily.   Past Week at Unknown time  . hydrochlorothiazide (MICROZIDE) 12.5 MG capsule Take 12.5 mg by mouth daily.   07/26/2014 at 0800  . hydrochlorothiazide (MICROZIDE) 12.5 MG capsule TAKE ONE CAPSULE BY MOUTH ONCE DAILY IN THE MORNING (Patient not taking: Reported on 02/04/2016) 30 capsule 0   . levothyroxine (SYNTHROID, LEVOTHROID) 125 MCG tablet Take 125 mcg by mouth daily before breakfast.   07/26/2014 at 0400  . omeprazole (PRILOSEC) 20 MG capsule Take 1 capsule (20 mg total) by mouth daily. (Patient not taking: Reported on 02/04/2016) 30 capsule 0   . ondansetron (ZOFRAN) 4 MG tablet Take 1 tablet (4  mg total) by mouth every 6 (six) hours. (Patient not taking: Reported on 02/04/2016) 12 tablet 0     ALLERGIES:  No Known Allergies  REVIEW OF SYSTEMS:  A comprehensive review of systems was negative except for: Musculoskeletal: positive for arthralgias, myalgias and stiff joints   FAMILY HISTORY:   Family History  Problem Relation Age of Onset  . Hypertension Mother   . Hypertension Father   . Stroke Father   . Hypertension Brother     SOCIAL HISTORY:   Social History  Substance Use Topics  . Smoking status: Current Some Day Smoker  . Smokeless tobacco: Never Used  . Alcohol Use: No     EXAMINATION:  Vital signs in last 24 hours: Temp:  [98.2 F (36.8 C)] 98.2 F (36.8 C) (03/20 0617) Pulse Rate:  [87] 87 (03/20 0617) Resp:  [16] 16 (03/20 0617) BP: (127)/(69) 127/69 mmHg (03/20 0620) SpO2:  [98 %] 98 % (03/20 0617)  General appearance: alert, cooperative and no distress Head: Normocephalic, without obvious abnormality, atraumatic Lungs: clear to auscultation bilaterally Heart: regular rate and rhythm, S1, S2 normal, no murmur, click, rub or gallop Abdomen: soft, non-tender; bowel sounds normal; no masses,  no organomegaly Extremities: extremities normal, atraumatic, no cyanosis or edema and no edema, redness or tenderness in the calves or thighs Neurologic: Alert and oriented X 3, normal strength and tone. Normal symmetric reflexes. Normal coordination and gait  Musculoskeletal:  ROM 0-110, Ligaments intact,  Imaging Review Plain radiographs demonstrate  severe degenerative joint disease of the left knee. The overall alignment is neutral. The bone quality appears to be good for age and reported activity level.  Assessment/Plan: Primary osteoarthritis, left knee   The patient history, physical examination and imaging studies are consistent with advanced degenerative joint disease of the left knee. The patient has failed conservative treatment.  The clearance notes  were reviewed.  After discussion with the patient it was felt that Total Knee Replacement was indicated. The procedure,  risks, and benefits of total knee arthroplasty were presented and reviewed. The risks including but not limited to aseptic loosening, infection, blood clots, vascular injury, stiffness, patella tracking problems complications among others were discussed. The patient acknowledged the explanation, agreed to proceed with the plan.  Guy SandiferColby Alan Yazlynn Navarro 02/17/2016, 6:26 AM

## 2016-02-17 NOTE — Op Note (Addendum)
TOTAL KNEE REPLACEMENT OPERATIVE NOTE:  02/17/2016  1:11 PM  PATIENT:  Tiffany Navarro  60 y.o. female  PRE-OPERATIVE DIAGNOSIS:  primary osteoarthritis left knee  POST-OPERATIVE DIAGNOSIS:  primary osteoarthritis left knee  PROCEDURE:  Procedure(s): TOTAL KNEE ARTHROPLASTY  SURGEON:  Surgeon(s): Dannielle HuhSteve Lavin Petteway, MD  PHYSICIAN ASSISTANT: Tiffany Navarro, [facility]  ANESTHESIA:   spinal  DRAINS: Hemovac  SPECIMEN: None  COUNTS:  Correct  TOURNIQUET:   Total Tourniquet Time Documented: Thigh (Left) - 57 minutes Total: Thigh (Left) - 57 minutes   DICTATION:  Indication for procedure:    The patient is a 60 y.o. female who has failed conservative treatment for primary osteoarthritis left knee.  Informed consent was obtained prior to anesthesia. The risks versus benefits of the operation were explain and in a way the patient can, and did, understand.   On the implant demand matching protocol, this patient scored 9.  Therefore, this patient did" "did not receive a polyethylene insert with vitamin E which is a high demand implant.  Description of procedure:     The patient was taken to the operating room and placed under anesthesia.  The patient was positioned in the usual fashion taking care that all body parts were adequately padded and/or protected.  I foley catheter was not placed.  A tourniquet was applied and the leg prepped and draped in the usual sterile fashion.  The extremity was exsanguinated with the esmarch and tourniquet inflated to 350 mmHg.  Pre-operative range of motion was normal.  The knee was in 10 degree of significant valgus.  A midline incision approximately 6-7 inches long was made with a #10 blade.  A new blade was used to make a parapatellar arthrotomy going 2-3 cm into the quadriceps tendon, over the patella, and alongside the medial aspect of the patellar tendon.  A synovectomy was then performed with the #10 blade and forceps. I then elevated the deep MCL off  the medial tibial metaphysis subperiosteally around to the semimembranosus attachment.    I everted the patella and used calipers to measure patellar thickness.  I used the reamer to ream down to appropriate thickness to recreate the native thickness.  I then removed excess bone with the rongeur and sagittal saw.  I used the appropriately sized template and drilled the three lug holes.  I then put the trial in place and measured the thickness with the calipers to ensure recreation of the native thickness.  The trial was then removed and the patella subluxed and the knee brought into flexion.  A homan retractor was place to retract and protect the patella and lateral structures.  A Z-retractor was place medially to protect the medial structures.  The extra-medullary alignment system was used to make cut the tibial articular surface perpendicular to the anamotic axis of the tibia and in 3 degrees of posterior slope.  The cut surface and alignment jig was removed.  I then used the intramedullary alignment guide to make a 4 valgus cut on the distal femur.  I then marked out the epicondylar axis on the distal femur.  The posterior condylar axis measured 5 degrees.  I then used the anterior referencing sizer and measured the femur to be a size 7.  The 4-In-1 cutting block was screwed into place in external rotation matching the posterior condylar angle, making our cuts perpendicular to the epicondylar axis.  Anterior, posterior and chamfer cuts were made with the sagittal saw.  The cutting block and cut pieces  were removed.  A lamina spreader was placed in 90 degrees of flexion.  The ACL, PCL, menisci, and posterior condylar osteophytes were removed.  A 12 mm spacer blocked was found to offer good flexion and extension gap balance after severe in degree releasing.   The scoop retractor was then placed and the femoral finishing block was pinned in place.  The small sagittal saw was used as well as the lug drill to  finish the femur.  The block and cut surfaces were removed and the medullary canal hole filled with autograft bone from the cut pieces.  The tibia was delivered forward in deep flexion and external rotation.  A size D tray was selected and pinned into place centered on the medial 1/3 of the tibial tubercle.  The reamer and keel was used to prepare the tibia through the tray.    I then trialed with the size 7 femur, size D tibia, a 12 mm insert and the 32 patella.  I had excellent flexion/extension gap balance, excellent patella tracking.  Flexion was full and beyond 120 degrees; extension was zero.  These components were chosen and the staff opened them to me on the back table while the knee was lavaged copiously and the cement mixed.  The soft tissue was infiltrated with 60cc of exparel 1.3% through a 21 gauge needle.  I cemented in the components and removed all excess cement.  The polyethylene tibial component was snapped into place and the knee placed in extension while cement was hardening.  The capsule was infilltrated with 30cc of .25% Marcaine with epinephrine.  A hemovac was place in the joint exiting superolaterally.  A pain pump was place superomedially superficial to the arthrotomy.  Once the cement was hard, the tourniquet was let down.  Hemostasis was obtained.  The arthrotomy was closed with figure-8 #1 vicryl sutures.  The deep soft tissues were closed with #0 vicryls and the subcuticular layer closed with a running #2-0 vicryl.  The skin was reapproximated and closed with skin staples.  The wound was dressed with xeroform, 4 x4's, 2 ABD sponges, a single layer of webril and a TED stocking.   The patient was then awakened, extubated, and taken to the recovery room in stable condition.  BLOOD LOSS:  300cc DRAINS: 1 hemovac, 1 pain catheter COMPLICATIONS:  None.  PLAN OF CARE: Admit to inpatient   PATIENT DISPOSITION:  PACU - hemodynamically stable.   Delay start of Pharmacological  VTE agent (>24hrs) due to surgical blood loss or risk of bleeding:  not applicable  Please fax a copy of this op note to my office at [phone] (please only include page 1 and 2 of the Case Information op note)

## 2016-02-17 NOTE — Transfer of Care (Signed)
Immediate Anesthesia Transfer of Care Note  Patient: Tiffany FleetingLaurie E Badami  Procedure(s) Performed: Procedure(s): TOTAL KNEE ARTHROPLASTY (Left)  Patient Location: PACU  Anesthesia Type:Spinal  Level of Consciousness: awake, alert  and oriented  Airway & Oxygen Therapy: Patient Spontanous Breathing and Patient connected to nasal cannula oxygen  Post-op Assessment: Report given to RN, Post -op Vital signs reviewed and stable and Patient moving all extremities X 4  Post vital signs: Reviewed and stable  Last Vitals:  Filed Vitals:   02/17/16 0617 02/17/16 0620  BP:  127/69  Pulse: 87   Temp: 36.8 C   Resp: 16    HR 61, RR 16, Sats 100, BP 87/53  Complications: No apparent anesthesia complications

## 2016-02-17 NOTE — Anesthesia Procedure Notes (Addendum)
Spinal Patient location during procedure: OR Start time: 02/17/2016 7:30 AM End time: 02/17/2016 7:35 AM Staffing Anesthesiologist: Arta BruceSSEY, KEVIN Performed by: anesthesiologist  Preanesthetic Checklist Completed: patient identified, site marked, surgical consent, pre-op evaluation, timeout performed, IV checked, risks and benefits discussed and monitors and equipment checked Spinal Block Patient position: sitting Prep: Betadine Patient monitoring: heart rate, cardiac monitor, continuous pulse ox and blood pressure Approach: right paramedian Location: L3-4 Injection technique: single-shot Needle Needle type: Pencan  Needle gauge: 24 G  Procedure Name: MAC Date/Time: 02/17/2016 7:30 AM Performed by: Glo HerringLEE, Tiffany Navarro Pre-anesthesia Checklist: Patient identified, Emergency Drugs available, Timeout performed, Suction available and Patient being monitored Patient Re-evaluated:Patient Re-evaluated prior to inductionOxygen Delivery Method: Simple face mask Intubation Type: IV induction Placement Confirmation: positive ETCO2 and breath sounds checked- equal and bilateral Dental Injury: Teeth and Oropharynx as per pre-operative assessment

## 2016-02-17 NOTE — Anesthesia Postprocedure Evaluation (Signed)
Anesthesia Post Note  Patient: Tiffany FleetingLaurie E Renzulli  Procedure(s) Performed: Procedure(s) (LRB): TOTAL KNEE ARTHROPLASTY (Left)  Patient location during evaluation: PACU Anesthesia Type: Spinal Level of consciousness: awake and alert Pain management: pain level controlled Vital Signs Assessment: post-procedure vital signs reviewed and stable Respiratory status: spontaneous breathing, nonlabored ventilation, respiratory function stable and patient connected to nasal cannula oxygen Cardiovascular status: blood pressure returned to baseline and stable Postop Assessment: no signs of nausea or vomiting Anesthetic complications: no    Last Vitals:  Filed Vitals:   02/17/16 1017 02/17/16 1030  BP: 102/62   Pulse: 71 71  Temp:    Resp: 13 9    Last Pain:  Filed Vitals:   02/17/16 1031  PainSc: 0-No pain    LLE Motor Response: No movement due to regional block (02/17/16 1030)   RLE Motor Response: No movement due to regional block (02/17/16 1030)   L Sensory Level: L3-Anterior knee, lower leg (02/17/16 1030) R Sensory Level: L3-Anterior knee, lower leg (02/17/16 1030)  Kareena Arrambide DAVID

## 2016-02-17 NOTE — Progress Notes (Signed)
Utilization review completed. Tayven Renteria, RN, BSN. 

## 2016-02-17 NOTE — Anesthesia Preprocedure Evaluation (Addendum)
Anesthesia Evaluation  Patient identified by MRN, date of birth, ID band Patient awake    Reviewed: Allergy & Precautions, NPO status , Patient's Chart, lab work & pertinent test results  Airway Mallampati: II  TM Distance: >3 FB Neck ROM: Full    Dental  (+) Dental Advisory Given, Partial Upper, Teeth Intact   Pulmonary Current Smoker,    Pulmonary exam normal        Cardiovascular hypertension, Pt. on medications Normal cardiovascular exam     Neuro/Psych    GI/Hepatic   Endo/Other  Hypothyroidism   Renal/GU      Musculoskeletal  (+) Arthritis ,   Abdominal   Peds  Hematology   Anesthesia Other Findings   Reproductive/Obstetrics                            Anesthesia Physical Anesthesia Plan  ASA: II  Anesthesia Plan: Spinal   Post-op Pain Management:    Induction: Intravenous  Airway Management Planned: Simple Face Mask  Additional Equipment:   Intra-op Plan:   Post-operative Plan:   Informed Consent: I have reviewed the patients History and Physical, chart, labs and discussed the procedure including the risks, benefits and alternatives for the proposed anesthesia with the patient or authorized representative who has indicated his/her understanding and acceptance.   Dental advisory given  Plan Discussed with: Surgeon and CRNA  Anesthesia Plan Comments:        Anesthesia Quick Evaluation

## 2016-02-17 NOTE — Evaluation (Signed)
Physical Therapy Evaluation Patient Details Name: Tiffany Navarro MRN: [id number] DOB: 06/28/1956 Today's Date: 02/17/2016   History of Present Illness  Patient is a 60 y/o female with hx of HTN, thyroid disease and self reported sciatica s/p left TKA.   Clinical Impression  Patient presents with pain and decreased AROM/strength s/p above surgery impacting mobility. Pain is pt's biggest limiting factor during today's assessment. [facility] A for balance and cues for technique. Pt's left knee positioned in 40 degrees knee flexion and not able to extend with assist due to pain. Assisted with positioning to help with extension and instructed pt in exercises to perform. Encouraged OOB to chair later in day with RN. Pt plans to d/c home with support from family. Will follow acutely to maximize independence and mobility prior to return home.    Follow Up Recommendations Home health PT;Supervision/Assistance - 24 hour    Equipment Recommendations  3in1 (PT)    Recommendations for Other Services OT consult     Precautions / Restrictions Precautions Precautions: Knee;Fall Precaution Booklet Issued: No Precaution Comments: Reviewed no pillow under knee and precautions. Restrictions Weight Bearing Restrictions: Yes LLE Weight Bearing: Weight bearing as tolerated      Mobility  Bed Mobility Overal bed mobility: Needs Assistance Bed Mobility: Supine to Sit;Sit to Supine     Supine to sit: Min assist;HOB elevated Sit to supine: Min assist;HOB elevated   General bed mobility comments: Assist with managing LLE to get into/out of bed. + emesis sitting EOB.  Transfers Overall transfer level: Needs assistance Equipment used: Rolling walker (2 wheeled) Transfers: Sit to/from [employer] CorporationStand;Stand Pivot Transfers Sit to Stand: Min assist;From elevated surface Stand pivot transfers: Min assist       General transfer comment: Despite cues for hand placement/technique pt  attempting to pull up on RW and then pushing Rw away and sitting prematurely on BSC. Min A for balance/safety. Not able to flatten foot on ground and increased knee flexion throughout. Declined getting into chair.  Ambulation/Gait Ambulation/Gait assistance:  (Declined due to pain.)              Stairs            Wheelchair Mobility    Modified Rankin (Stroke Patients Only)       Balance Overall balance assessment: Needs assistance Sitting-balance support: Feet supported;No upper extremity supported Sitting balance-Leahy Scale: Fair     Standing balance support: During functional activity;Bilateral upper extremity supported Standing balance-Leahy Scale: Poor Standing balance comment: Reliant on BUEs for support. Increased trunk/hip flexion and pt not able to extend LLE in standing.                              Pertinent Vitals/Pain Pain Assessment: Faces Faces Pain Scale: Hurts whole lot Pain Location: left knee Pain Descriptors / Indicators: Sore;Aching;Constant Pain Intervention(s): Monitored during session;Repositioned;Premedicated before session;Ice applied;Limited activity within patient's tolerance    Home Living Family/patient expects to be discharged to:: Private residence Living Arrangements: Children;Spouse/significant other Available Help at Discharge: Family Type of Home: House Home Access: Stairs to enter Entrance Stairs-Rails: None Entrance Stairs-Number of Steps: 1 Home Layout: One level Home Equipment: Emergency planning/management officerhower seat;Walker - 2 wheels      Prior Function Level of Independence: Independent with assistive device(s)         Comments: Pt using RW PTA due to sciatica for the last 2-3 weeks and knee pain.  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     Hand Dominance        Extremity/Trunk Assessment   Upper Extremity Assessment: Defer to OT evaluation           Lower Extremity Assessment: LLE  deficits/detail   LLE Deficits / Details: Knee positioned in ~30-40 degrees knee flexion. Attempted AAROM/PROM to get to extension however pt not able to tolerate.      Communication   Communication: No difficulties  Cognition Arousal/Alertness: Awake/alert Behavior During Therapy: Restless Overall Cognitive Status: Within Functional Limits for tasks assessed                      General Comments General comments (skin integrity, edema, etc.): Spent ample time discussing importance of zero degree knee and positioning of left knee while in supine as pt's knee at rest was in 40 degrees knee flexion and not able to actively or passively extend due to pain/tightness. Locked bottom half of bed.    Exercises Total Joint Exercises Ankle Circles/Pumps: Both;10 reps;Supine Quad Sets: Both;5 reps;Supine      Assessment/Plan    PT Assessment Patient needs continued PT services  PT Diagnosis Acute pain;Difficulty walking   PT Problem List Decreased strength;Pain;Decreased range of motion;Decreased activity tolerance;Decreased balance;Decreased mobility;Decreased knowledge of precautions;Decreased safety awareness  PT Treatment Interventions Balance training;Gait training;Functional mobility training;Therapeutic activities;Therapeutic exercise;Patient/family education;Stair training;DME instruction   PT Goals (Current goals can be found in the Care Plan section) Acute Rehab PT Goals Patient Stated Goal: to return to independence PT Goal Formulation: With patient Time For Goal Achievement: 03/02/16 Potential to Achieve Goals: Good    Frequency 7X/week   Barriers to discharge        Co-evaluation               End of Session Equipment Utilized During Treatment: Gait belt Activity Tolerance: Patient limited by pain Patient left: in bed;with call bell/phone within reach Nurse Communication: Mobility status         Time: 1610-9604 PT Time Calculation (min) (ACUTE  ONLY): 32 min   Charges:   PT Evaluation $PT Eval Low Complexity: 1 Procedure PT Treatments $Therapeutic Activity: 8-22 mins   PT G Codes:        Charizma Gardiner A Tarun Patchell 02/17/2016, 4:43 PM Mylo Red, PT, DPT [phone]

## 2016-02-18 ENCOUNTER — Encounter (HOSPITAL_COMMUNITY): Payer: Self-pay | Admitting: Orthopedic Surgery

## 2016-02-18 LAB — BASIC METABOLIC PANEL
Anion gap: 7 (ref 5–15)
BUN: 8 mg/dL (ref 6–20)
CALCIUM: 8.5 mg/dL — AB (ref 8.9–10.3)
CO2: 28 mmol/L (ref 22–32)
CREATININE: 0.59 mg/dL (ref 0.44–1.00)
Chloride: 102 mmol/L (ref 101–111)
GFR calc non Af Amer: >60 mL/min (ref >60)
Glucose, Bld: 152 mg/dL — ABNORMAL HIGH (ref 65–99)
Potassium: 3.8 mmol/L (ref 3.5–5.1)
SODIUM: 137 mmol/L (ref 135–145)

## 2016-02-18 LAB — CBC
HEMATOCRIT: 36.1 % (ref 36.0–46.0)
Hemoglobin: 11.5 g/dL — ABNORMAL LOW (ref 12.0–15.0)
MCH: 29.5 pg (ref 26.0–34.0)
MCHC: 31.9 g/dL (ref 30.0–36.0)
MCV: 92.6 fL (ref 78.0–100.0)
Platelets: 283 10*3/uL (ref 150–400)
RBC: 3.9 MIL/uL (ref 3.87–5.11)
RDW: 14 % (ref 11.5–15.5)
WBC: 13.7 10*3/uL — ABNORMAL HIGH (ref 4.0–10.5)

## 2016-02-18 MED ORDER — ENOXAPARIN SODIUM 30 MG/0.3ML ~~LOC~~ SOLN
30.0000 mg | Freq: Two times a day (BID) | SUBCUTANEOUS | Status: DC
  Administered 2016-02-18 – 2016-02-19 (×2): 30 mg via SUBCUTANEOUS
  Filled 2016-02-18 (×2): qty 0.3

## 2016-02-18 NOTE — Evaluation (Signed)
Occupational Therapy Evaluation Patient Details Name: Tiffany Navarro MRN: [id number] DOB: [birth year] Today's Date: 02/18/2016    History of Present Illness Patient is a 60 y/o female with hx of HTN, thyroid disease and self reported sciatica s/p left TKA.    Clinical Impression   This 60 yo female admitted and underwent above presents to acute OT with deficits below. She will benefit from one more session of OT to address shower stall transfers.    Follow Up Recommendations  No OT follow up    Equipment Recommendations  3 in 1 bedside comode       Precautions / Restrictions Precautions Precautions: Knee;Fall Precaution Comments: Reviewed she needed to not raise lower part of bed but rather keep it flat (she said she was not sure how it got raised, she thought it was locked out). I locked it back out in complete LE extension Restrictions Weight Bearing Restrictions: No LLE Weight Bearing: Weight bearing as tolerated      Mobility Bed Mobility               General bed mobility comments: Did not get OOB due to pt back in bed not too long and says she needs to rest due to being so painful when she was up in recliner. She reports she will get up tommorrow with OT to practice shower stall trnsfer)            ADL Overall ADL's : Needs assistance/impaired Eating/Feeding: Independent;Bed level   Grooming: Set up;Bed level   Upper Body Bathing: Set up;Bed level   Lower Body Bathing: Total assistance;Bed level   Upper Body Dressing : Moderate assistance;Bed level   Lower Body Dressing: Total assistance;Bed level                                 Pertinent Vitals/Pain Pain Assessment: 0-10 Pain Score: 7  Pain Location: left knee ("I had to get back in bed it was hurting so much in the recliner") Pain Descriptors / Indicators: Aching;Sore Pain Intervention(s): Limited activity within patient's tolerance;Monitored during session;Repositioned      Hand Dominance Right   Extremity/Trunk Assessment Upper Extremity Assessment Upper Extremity Assessment: Overall WFL for tasks assessed           Communication Communication Communication: No difficulties   Cognition Arousal/Alertness: Awake/alert Behavior During Therapy: WFL for tasks assessed/performed Overall Cognitive Status: Within Functional Limits for tasks assessed                                Home Living Family/patient expects to be discharged to:: Private residence Living Arrangements: Children;Spouse/significant other Available Help at Discharge: Family Type of Home: House Home Access: Stairs to enter Secretary/administrator of Steps: 1 Entrance Stairs-Rails: None Home Layout: One level     Bathroom Shower/Tub: Producer, television/film/video: Handicapped height     Home Equipment: Emergency planning/management officer - 2 wheels          Prior Functioning/Environment Level of Independence: Independent with assistive device(s)        Comments: Pt using RW PTA due to sciatica for the last 2-3 weeks and knee pain. 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    OT Diagnosis: Generalized weakness;Acute pain   OT  Problem List: Decreased strength;Decreased range of motion;Pain;Decreased activity tolerance (imparied balance per PT note from eariler)   OT Treatment/Interventions: Self-care/ADL training;Patient/family education;Balance training;DME and/or AE instruction    OT Goals(Current goals can be found in the care plan section) Acute Rehab OT Goals Patient Stated Goal: to practice shower stall transfer tomorrow OT Goal Formulation: With patient Time For Goal Achievement: 02/25/16 Potential to Achieve Goals: Good  OT Frequency: Min 2X/week              End of Session    Activity Tolerance: Patient limited by pain Patient left: in bed   Time: 1202-1215 OT Time  Calculation (min): 13 min Charges:  OT General Charges $OT Visit: 1 Procedure OT Evaluation $OT Eval Moderate Complexity: 1 Procedure  [name], [name] [phone] 02/18/2016, 4:27 PM

## 2016-02-18 NOTE — Progress Notes (Signed)
SPORTS MEDICINE AND JOINT REPLACEMENT  Tiffany SpurlingStephen Lucey, MD   [facility] Shaul Trautman PA-C [address], [place], [place]  [id number]                             [phone]   PROGRESS NOTE  Subjective:  negative for Chest Pain  negative for Shortness of Breath  negative for Nausea/Vomiting   negative for Calf Pain  negative for Bowel Movement   Tolerating Diet: yes         Patient reports pain as 8 on 0-10 scale.    Objective: Vital signs in last 24 hours:   Patient Vitals for the past 24 hrs:  BP Temp Temp src Pulse Resp SpO2  02/18/16 1400 93/78 mmHg 98.4 F (36.9 C) Oral (!) 102 18 95 %  02/18/16 0501 113/65 mmHg 98.4 F (36.9 C) - 96 18 96 %  02/18/16 0207 (!) 116/58 mmHg 99.5 F (37.5 C) - 94 - 93 %  02/17/16 1955 122/68 mmHg 98.2 F (36.8 C) - 95 18 95 %    @flow {1959:LAST@   Intake/Output from previous day:   03/20 0701 - 03/21 0700 In: 1978.8 [I.V.:1878.8] Out: 350 [Urine:300]   Intake/Output this shift:   03/21 0701 - 03/21 1900 In: 402.5 [P.O.:120; I.V.:282.5] Out: -    Intake/Output      03/20 0701 - 03/21 0700 03/21 0701 - 03/22 0700   P.O.  120   I.V. 1878.8 282.5   IV Piggyback 100    Total Intake 1978.8 402.5   Urine 300    Blood 50    Total Output 350     Net +1628.8 +402.5        Urine Occurrence 3 x 1 x      LABORATORY DATA:  Recent Labs  02/17/16 1223 02/18/16 0456  WBC 16.5* 13.7*  HGB 13.0 11.5*  HCT 40.1 36.1  PLT 312 283    Recent Labs  02/17/16 1223 02/18/16 0456  NA  --  137  K  --  3.8  CL  --  102  CO2  --  28  BUN  --  8  CREATININE 0.66 0.59  GLUCOSE  --  152*  CALCIUM  --  8.5*   Lab Results  Component Value Date   INR 1.08 02/06/2016    Examination:  General appearance: alert, cooperative and mild distress Extremities: extremities normal, atraumatic, no cyanosis or edema  Wound Exam: clean, dry, intact   Drainage:  None: wound tissue dry  Motor Exam: Quadriceps and Hamstrings Intact  Sensory  Exam: Superficial Peroneal, Deep Peroneal and Tibial normal   Assessment:    1 Day Post-Op  Procedure(s) (LRB): TOTAL KNEE ARTHROPLASTY (Left)  ADDITIONAL DIAGNOSIS:  Active Problems:   S/P total knee replacement  Acute Blood Loss Anemia   Plan: Physical Therapy as ordered Weight Bearing as Tolerated (WBAT)  DVT Prophylaxis:  Lovenox  DISCHARGE PLAN: Home  DISCHARGE NEEDS: HHPT         Tiffany SandiferColby Navarro Tiffany Navarro 02/18/2016, 4:16 PM

## 2016-02-18 NOTE — Progress Notes (Signed)
Physical Therapy Treatment Patient Details Name: Tiffany Navarro MRN: [id number] DOB: Jun 15, 1956 Today's Date: 02/18/2016    History of Present Illness Patient is a 60 y/o female with hx of HTN, thyroid disease and self reported sciatica s/p left TKA.     PT Comments    Patient is progressing toward PT goals. Continue to progress as tolerated.   Follow Up Recommendations  Home health PT;Supervision/Assistance - 24 hour     Equipment Recommendations  3in1 (PT)    Recommendations for Other Services OT consult     Precautions / Restrictions Precautions Precautions: Knee;Fall Precaution Booklet Issued: No Precaution Comments: Reviewed no pillow under knee and precautions. Restrictions Weight Bearing Restrictions: Yes LLE Weight Bearing: Weight bearing as tolerated    Mobility  Bed Mobility Overal bed mobility: Needs Assistance Bed Mobility: Supine to Sit;Sit to Supine     Supine to sit: Min guard Sit to supine: Min assist   General bed mobility comments: assit to elevate L LE into bed; HOB flat and no use of bedrails  Transfers Overall transfer level: Needs assistance Equipment used: Rolling walker (2 wheeled) Transfers: Sit to/from Stand Sit to Stand: Min guard         General transfer comment: min guard for safety; cues for hand placement  Ambulation/Gait Ambulation/Gait assistance: Min guard Ambulation Distance (Feet): 70 Feet Assistive device: Rolling walker (2 wheeled) Gait Pattern/deviations: Step-through pattern;Decreased stance time - left;Decreased step length - right;Decreased dorsiflexion - left;Decreased weight shift to left;Antalgic;Trunk flexed Gait velocity: decreased   General Gait Details: pt with decreased L hip flexion and continues to maintain L knee flexion but with some improvement from previous session; max cues for L heel strike and engaging L quad before WS to L LE; pt with improved ability to WB on L LE this session    Stairs             Wheelchair Mobility    Modified Rankin (Stroke Patients Only)       Balance     Sitting balance-Leahy Scale: Good       Standing balance-Leahy Scale: Fair                      Cognition Arousal/Alertness: Awake/alert Behavior During Therapy: WFL for tasks assessed/performed Overall Cognitive Status: Within Functional Limits for tasks assessed                      Exercises      General Comments General comments (skin integrity, edema, etc.): pt left with L LE in zero degree foam with ice pack applied; pt with improved L knee extension with relaxation techniques       Pertinent Vitals/Pain Pain Assessment: No/denies pain Pain Score: 7  Pain Location: left knee ("I had to get back in bed it was hurting so much in the recliner") Pain Descriptors / Indicators: Aching;Sore Pain Intervention(s): Monitored during session;Premedicated before session;Repositioned;Ice applied    Home Living Family/patient expects to be discharged to:: Private residence Living Arrangements: Children;Spouse/significant other Available Help at Discharge: Family Type of Home: House Home Access: Stairs to enter Entrance Stairs-Rails: None Home Layout: One level Home Equipment: Emergency planning/management officer - 2 wheels      Prior Function Level of Independence: Independent with assistive device(s)      Comments: Pt using RW PTA due to sciatica for the last 2-3 weeks and knee pain. 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   PT Goals (current goals can now be found in the care plan section) Acute Rehab PT Goals Patient Stated Goal: to return to independence Progress towards PT goals: Progressing toward goals    Frequency  7X/week    PT Plan Current plan remains appropriate    Co-evaluation             End of Session Equipment Utilized During Treatment: Gait belt Activity Tolerance:  Patient limited by pain Patient left: with call bell/phone within reach;in bed     Time: [id number] PT Time Calculation (min) (ACUTE ONLY): 32 min  Charges:  $Gait Training: 8-22 mins $Therapeutic Activity: 8-22 mins                    G Codes:      Derek MoundKellyn R Jasneet Schobert Jaxon Flatt, PTA Pager: [phone]   02/18/2016, 4:40 PM

## 2016-02-18 NOTE — Progress Notes (Signed)
Orthopedic Tech Progress Note Patient Details:  Tiffany FleetingLaurie E Wholey 05/24/1956 [id number] On cpm at 1944 Patient ID: Tiffany FleetingLaurie E Glaeser, female   DOB: 12/29/1955, 60 y.o.   MRN: [id number]   Jennye MoccasinHughes, Kadience Macchi Craig 02/18/2016, 7:43 PM

## 2016-02-18 NOTE — Care Management Note (Signed)
Case Management Note  Patient Details  Name: Tiffany FleetingLaurie E Eland MRN: [id number] Date of Birth: 08/09/1956  Subjective/Objective:  60 yr old female s/p left total knee arthroplasty.                  Action/Plan: Case manager spoke with patient at the bedside concerning home health and DME needs. [facility], no changes. Rolling walker and CPM have been delivered to patient's home. She is requesting a 3in1, CM has ordered. Patient will have family support.   Expected Discharge Date:    02/19/16              Expected Discharge Plan:  Home w Home Health Services  In-House Referral:  NA  Discharge planning Services  CM Consult  Post Acute Care Choice:  Durable Medical Equipment, Home Health Choice offered to:  Patient  DME Arranged:  3-N-1 DME Agency:  [facility]  [facility]  [facility]  Status of Service:  Completed, signed off  Medicare Important Message Given:    Date Medicare IM Given:    Medicare IM give by:    Date Additional Medicare IM Given:    Additional Medicare Important Message give by:     If discussed at Long Length of Stay Meetings, dates discussed:    Additional Comments:  Durenda GuthrieBrady, Riggins Cisek Naomi, RN 02/18/2016, 3:57 PM

## 2016-02-18 NOTE — Progress Notes (Signed)
Orthopedic Tech Progress Note Patient Details:  Tiffany FleetingLaurie E Furnish 12/28/1955 [id number]  Patient ID: Tiffany FleetingLaurie E Sambrano, female   DOB: 12/02/1955, 60 y.o.   MRN: [id number] Applied cpm 0-40. Pt had to much pain to go any higher. Started at 1160 and worked down till she could stand it.  [name], Dohn Stclair J 02/18/2016, 5:56 AM

## 2016-02-18 NOTE — Progress Notes (Signed)
Physical Therapy Treatment Patient Details Name: Tiffany Navarro MRN: [id number] DOB: October 14, 1956 Today's Date: 02/18/2016    History of Present Illness Patient is a 60 y/o female with hx of HTN, thyroid disease and self reported sciatica s/p left TKA.     PT Comments    Patient is progressing gradually toward PT goals. Pt limited by pain and with decreased ROM. Stressed importance of using zero degree foam and positioning. Continue to progress as tolerated.   Follow Up Recommendations  Home health PT;Supervision/Assistance - 24 hour     Equipment Recommendations  3in1 (PT)    Recommendations for Other Services OT consult     Precautions / Restrictions Precautions Precautions: Knee;Fall Precaution Booklet Issued: No Precaution Comments: Reviewed no pillow under knee and precautions. Restrictions Weight Bearing Restrictions: Yes LLE Weight Bearing: Weight bearing as tolerated    Mobility  Bed Mobility Overal bed mobility: Needs Assistance Bed Mobility: Supine to Sit     Supine to sit: Min assist;HOB elevated     General bed mobility comments: assist to bring L LE to EOB; HOB elevated; cues for technique  Transfers Overall transfer level: Needs assistance Equipment used: Rolling walker (2 wheeled) Transfers: Sit to/from Stand Sit to Stand: Min assist;From elevated surface         General transfer comment: assist to maintain balance for transitioning hand placement from EOB to RW with increased time needed; cues for hand placement and encouragement to attempt planting L heel on ground before advancing L LE forward but unable; maintained L knee flexion  Ambulation/Gait Ambulation/Gait assistance: Min guard Ambulation Distance (Feet): 10 Feet Assistive device: Rolling walker (2 wheeled) Gait Pattern/deviations: Step-to pattern;Decreased stance time - left;Decreased step length - right;Decreased weight shift to left;Antalgic;Trunk flexed Gait velocity: decreased    General Gait Details: pt maintained L knee flexion throughout and ambulated on forefoot despite cues to attmept L heel strike; cues for position of RW, sequencing, and engaging L quad for attempt of improved L knee extension   Stairs            Wheelchair Mobility    Modified Rankin (Stroke Patients Only)       Balance Overall balance assessment: Needs assistance Sitting-balance support: No upper extremity supported;Feet supported Sitting balance-Leahy Scale: Fair     Standing balance support: Bilateral upper extremity supported Standing balance-Leahy Scale: Poor                      Cognition                            Exercises Total Joint Exercises Quad Sets: AROM;Left;10 reps;Supine Heel Slides: AROM;Left;5 reps;Supine Goniometric ROM: ~25-50 AROM    General Comments General comments (skin integrity, edema, etc.): discussed L LE positioning and reviewed precuations with pt due to pt's tendency to rest in flexion and stressed importance of zero degree foam; left pt with L LE in zero degree foam with pt able to rest in ~25 degrees of flexion      Pertinent Vitals/Pain Pain Assessment: 0-10 Pain Score: 10-Worst pain ever Pain Location: L knee Pain Descriptors / Indicators: Sore Pain Intervention(s): Limited activity within patient's tolerance;Monitored during session;Premedicated before session;Repositioned;Utilized relaxation techniques    Home Living                      Prior Function  PT Goals (current goals can now be found in the care plan section) Acute Rehab PT Goals Patient Stated Goal: to return to independence Progress towards PT goals: Progressing toward goals    Frequency  7X/week    PT Plan Current plan remains appropriate    Co-evaluation             End of Session Equipment Utilized During Treatment: Gait belt Activity Tolerance: Patient limited by pain Patient left: in chair;with call  bell/phone within reach     Time: 1021-1052 PT Time Calculation (min) (ACUTE ONLY): 31 min  Charges:  $Gait Training: 8-22 mins $Therapeutic Exercise: 8-22 mins                    G Codes:      Tiffany MoundKellyn R Jamya Starry [name], PTA Pager: [phone]) [phone]   02/18/2016, 11:32 AM

## 2016-02-19 LAB — CBC
HEMATOCRIT: 33.6 % — AB (ref 36.0–46.0)
HEMOGLOBIN: 10.8 g/dL — AB (ref 12.0–15.0)
MCH: 29.4 pg (ref 26.0–34.0)
MCHC: 32.1 g/dL (ref 30.0–36.0)
MCV: 91.6 fL (ref 78.0–100.0)
PLATELETS: 283 10*3/uL (ref 150–400)
RBC: 3.67 MIL/uL — AB (ref 3.87–5.11)
RDW: 13.7 % (ref 11.5–15.5)
WBC: 12.2 10*3/uL — AB (ref 4.0–10.5)

## 2016-02-19 MED ORDER — METHOCARBAMOL 500 MG PO TABS: 500.0000 mg | ORAL_TABLET | Freq: Four times a day (QID) | ORAL | Status: DC | PRN

## 2016-02-19 MED ORDER — HYDROCODONE-ACETAMINOPHEN 10-325 MG PO TABS: 1.0000 | ORAL_TABLET | Freq: Four times a day (QID) | ORAL | Status: DC | PRN

## 2016-02-19 MED ORDER — ONDANSETRON HCL 4 MG PO TABS: 4.0000 mg | ORAL_TABLET | Freq: Four times a day (QID) | ORAL | Status: DC | PRN

## 2016-02-19 MED ORDER — ENOXAPARIN SODIUM 40 MG/0.4ML ~~LOC~~ SOLN: 40.0000 mg | SUBCUTANEOUS | Status: DC

## 2016-02-19 NOTE — Progress Notes (Signed)
Occupational Therapy Treatment and Discharge Patient Details Name: LACRETIA TINDALL MRN: [id number] DOB: 03-16-1956 Today's Date: 02/19/2016    History of present illness Patient is a 60 y/o female with hx of HTN, thyroid disease and self reported sciatica s/p left TKA.    OT comments  This 60 yo female seen today for last OT session, all OT goals met per pt for grooming and toilet transfer and based off of how she is moving with me she is S for everything. Goals for shower stall transfer and in/OOB met during session. Acute OT will sign off with pt D/C'ing home today.  Follow Up Recommendations  No OT follow up    Equipment Recommendations  3 in 1 bedside comode       Precautions / Restrictions Precautions Precautions: Knee;Fall Precaution Booklet Issued: No Precaution Comments: Reviewed no pillow under knee and precautions. Restrictions Weight Bearing Restrictions: No LLE Weight Bearing: Weight bearing as tolerated       Mobility Bed Mobility Overal bed mobility: Modified Independent Bed Mobility: Supine to Sit;Sit to Supine     Supine to sit: Modified independent (Device/Increase time);HOB elevated Sit to supine: Modified independent (Device/Increase time) (HOB flat)    Transfers Overall transfer level: Needs assistance Equipment used: Rolling walker (2 wheeled) Transfers: Sit to/from Stand Sit to Stand: Supervision         General transfer comment: Pt ambulate 100 feet with RW and S        ADL Overall ADL's : Needs assistance/impaired                                 Tub/ Shower Transfer: Walk-in shower;Supervision/safety;Ambulation;Rolling walker;3 in 1                      Cognition   Behavior During Therapy: WFL for tasks assessed/performed Overall Cognitive Status: Within Functional Limits for tasks assessed                         Exercises Total Joint Exercises Quad Sets: AROM;Left;10 reps;Supine Heel Slides:  AROM;Left;10 reps;Supine Hip ABduction/ADduction: AAROM;Left;10 reps;Supine Long Arc Quad: AAROM;Left;10 reps;Seated Knee Flexion: AROM;Left;10 reps;Seated Goniometric ROM: 15-75           Pertinent Vitals/ Pain       Pain Assessment: 0-10 Pain Score: 2  Faces Pain Scale: Hurts little more Pain Location: left knee Pain Descriptors / Indicators: Aching;Sore Pain Intervention(s): Monitored during session;Repositioned         Frequency Min 2X/week     Progress Toward Goals  OT Goals(current goals can now be found in the care plan section)  Progress towards OT goals: Goals met/education completed, patient discharged from OT  Acute Rehab OT Goals Patient Stated Goal: go home  Plan All goals met and education completed, patient discharged from OT services       End of Session Equipment Utilized During Treatment: Gait belt;Rolling walker   Activity Tolerance Patient tolerated treatment well   Patient Left in bed;with call bell/phone within reach   Nurse Communication          Time: 0902-0920 OT Time Calculation (min): 18 min  Charges: OT General Charges $OT Visit: 1 Procedure OT Treatments $Self Care/Home Management : 8-22 mins  Almon Register [phone] 02/19/2016, 11:25 AM

## 2016-02-19 NOTE — Progress Notes (Signed)
SPORTS MEDICINE AND JOINT REPLACEMENT  Tiffany SpurlingStephen Lucey, MD   [facility] PA-C [address], [place], [place]  [id number]                             [phone]   PROGRESS NOTE  Subjective:  negative for Chest Pain  negative for Shortness of Breath  negative for Nausea/Vomiting   negative for Calf Pain  negative for Bowel Movement   Tolerating Diet: yes         Patient reports pain as 5 on 0-10 scale.    Objective: Vital signs in last 24 hours:   Patient Vitals for the past 24 hrs:  BP Temp Temp src Pulse Resp SpO2  02/18/16 2300 132/67 mmHg 98.5 F (36.9 C) Oral 90 18 100 %  02/18/16 1400 93/78 mmHg 98.4 F (36.9 C) Oral (!) 102 18 95 %    @flow {1959:LAST@   Intake/Output from previous day:   03/21 0701 - 03/22 0700 In: 642.5 [P.O.:360; I.V.:282.5] Out: -    Intake/Output this shift:       Intake/Output      03/21 0701 - 03/22 0700   P.O. 360   I.V. 282.5   Total Intake 642.5   Net +642.5       Urine Occurrence 4 x      LABORATORY DATA:  Recent Labs  02/17/16 1223 02/18/16 0456 02/19/16 0550  WBC 16.5* 13.7* 12.2*  HGB 13.0 11.5* 10.8*  HCT 40.1 36.1 33.6*  PLT 312 283 283    Recent Labs  02/17/16 1223 02/18/16 0456  NA  --  137  K  --  3.8  CL  --  102  CO2  --  28  BUN  --  8  CREATININE 0.66 0.59  GLUCOSE  --  152*  CALCIUM  --  8.5*   Lab Results  Component Value Date   INR 1.08 02/06/2016    Examination:  General appearance: alert, cooperative and mild distress Extremities: extremities normal, atraumatic, no cyanosis or edema  Wound Exam: clean, dry, intact   Drainage:  None: wound tissue dry  Motor Exam: Quadriceps and Hamstrings Intact  Sensory Exam: Superficial Peroneal, Deep Peroneal and Tibial normal   Assessment:    2 Days Post-Op  Procedure(s) (LRB): TOTAL KNEE ARTHROPLASTY (Left)  ADDITIONAL DIAGNOSIS:  Active Problems:   S/P total knee replacement  Acute Blood Loss Anemia   Plan: Physical  Therapy as ordered Weight Bearing as Tolerated (WBAT)  DVT Prophylaxis:  Lovenox  DISCHARGE PLAN: Home  DISCHARGE NEEDS: HHPT         Tiffany Navarro 02/19/2016, 6:58 AM

## 2016-02-19 NOTE — Progress Notes (Signed)
Discharge instructions given. Pt verbalized understanding and all questions were answered.  

## 2016-02-19 NOTE — Progress Notes (Signed)
Pt refused scheduled OxyContin 10mg . RN wasted this dose with Danille, RN.

## 2016-02-19 NOTE — Progress Notes (Signed)
Physical Therapy Treatment Patient Details Name: Tiffany FleetingLaurie E Navarro MRN: [id number] DOB: 06/12/1956 Today's Date: 02/19/2016    History of Present Illness Patient is a 60 y/o female with hx of HTN, thyroid disease and self reported sciatica s/p left TKA.     PT Comments    Patient is making good progress with PT.  From a mobility standpoint anticipate patient will be ready for DC home when medically ready.     Follow Up Recommendations  Home health PT;Supervision/Assistance - 24 hour     Equipment Recommendations  3in1 (PT)    Recommendations for Other Services OT consult     Precautions / Restrictions Precautions Precautions: Knee;Fall Precaution Booklet Issued: No Precaution Comments: Reviewed no pillow under knee and precautions. Restrictions Weight Bearing Restrictions: Yes LLE Weight Bearing: Weight bearing as tolerated    Mobility  Bed Mobility Overal bed mobility: Needs Assistance Bed Mobility: Supine to Sit     Supine to sit: Supervision     General bed mobility comments: supervision for safety  Transfers Overall transfer level: Needs assistance Equipment used: Rolling walker (2 wheeled) Transfers: Sit to/from Stand Sit to Stand: Supervision         General transfer comment: cues for hand placement  Ambulation/Gait Ambulation/Gait assistance: Supervision Ambulation Distance (Feet): 150 Feet Assistive device: Rolling walker (2 wheeled) Gait Pattern/deviations: Step-through pattern;Decreased stance time - left;Decreased step length - right;Decreased dorsiflexion - left;Antalgic;Trunk flexed Gait velocity: decreased   General Gait Details: cues for L heel strike, posture, and position of RW; one seated rest break; pt maintains L knee flexion at all times but demonstrated improved ability to achieve weight shift onto L heel    Stairs Stairs: Yes Stairs assistance: Min guard Stair Management: No rails;Backwards;With walker Number of Stairs:  2 General stair comments: educated pt on sequencing and technique; no knee buckling  Wheelchair Mobility    Modified Rankin (Stroke Patients Only)       Balance     Sitting balance-Leahy Scale: Good       Standing balance-Leahy Scale: Fair                      Cognition Arousal/Alertness: Awake/alert Behavior During Therapy: WFL for tasks assessed/performed Overall Cognitive Status: Within Functional Limits for tasks assessed                      Exercises Total Joint Exercises Quad Sets: AROM;Left;10 reps;Supine Heel Slides: AROM;Left;10 reps;Supine Hip ABduction/ADduction: AAROM;Left;10 reps;Supine Long Arc Quad: AAROM;Left;10 reps;Seated Knee Flexion: AROM;Left;10 reps;Seated Goniometric ROM: 15-75    General Comments        Pertinent Vitals/Pain Pain Assessment: Faces Faces Pain Scale: Hurts little more Pain Location: L hip/knee with mobility Pain Descriptors / Indicators: Sore;Aching Pain Intervention(s): Limited activity within patient's tolerance;Monitored during session;Premedicated before session;Repositioned    Home Living                      Prior Function            PT Goals (current goals can now be found in the care plan section) Acute Rehab PT Goals Patient Stated Goal: go home Progress towards PT goals: Progressing toward goals    Frequency  7X/week    PT Plan Current plan remains appropriate    Co-evaluation             End of Session Equipment Utilized During Treatment: Gait belt Activity Tolerance: Patient tolerated  treatment well Patient left: with call bell/phone within reach;in bed     Time: 1000-1037 PT Time Calculation (min) (ACUTE ONLY): 37 min  Charges:  $Gait Training: 8-22 mins $Therapeutic Exercise: 8-22 mins                    G Codes:      Derek Mound, PTA Pager: [phone]   02/19/2016, 10:50 AM

## 2016-02-23 NOTE — Discharge Summary (Signed)
SPORTS MEDICINE & JOINT REPLACEMENT   Georgena Spurling, MD    Laurier Nancy, PA-C [address] [place], [place], [place]  [address]                             [phone]  PATIENT ID: DEANN MCLAINE        MRN:  [id number]          DOB/AGE: 1956/04/22 / 60 y.o.    DISCHARGE SUMMARY  ADMISSION DATE:    02/17/2016 DISCHARGE DATE:   02/19/2016   ADMISSION DIAGNOSIS: primary osteoarthritis left knee    DISCHARGE DIAGNOSIS:  primary osteoarthritis left knee    ADDITIONAL DIAGNOSIS: Active Problems:   S/P total knee replacement  Past Medical History  Diagnosis Date  . Thyroid disease   . Hypertension   . Osteoarthritis     PROCEDURE: Procedure(s): TOTAL KNEE ARTHROPLASTY on 02/17/2016  CONSULTS:     HISTORY:  See H&P in chart  HOSPITAL COURSE:  AIREN DALES is a 60 y.o. admitted on 02/17/2016 and found to have a diagnosis of primary osteoarthritis left knee.  After appropriate laboratory studies were obtained  they were taken to the operating room on 02/17/2016 and underwent Procedure(s): TOTAL KNEE ARTHROPLASTY.   They were given perioperative antibiotics:  Anti-infectives    Start     Dose/Rate Route Frequency Ordered Stop   02/17/16 1330  ceFAZolin (ANCEF) IVPB 1 g/50 mL premix     1 g 100 mL/hr over 30 Minutes Intravenous Every 6 hours 02/17/16 1210 02/17/16 2309   02/17/16 0700  ceFAZolin (ANCEF) IVPB 2 g/50 mL premix     2 g 100 mL/hr over 30 Minutes Intravenous To ShortStay Surgical 02/14/16 0745 02/17/16 0729    .  Patient given tranexamic acid IV or topical and exparel intra-operatively.  Tolerated the procedure well.    POD# 1: Vital signs were stable.  Patient denied Chest pain, shortness of breath, or calf pain.  Patient was started on Lovenox 30 mg subcutaneously twice daily at 8am.  Consults to PT, OT, and care management were made.  The patient was weight bearing as tolerated.  CPM was placed on the operative leg 0-90 degrees for 6-8 hours a day. When  out of the CPM, patient was placed in the foam block to achieve full extension. Incentive spirometry was taught.  Dressing was changed.       POD #2, Continued  PT for ambulation and exercise program.  IV saline locked.  O2 discontinued.    The remainder of the hospital course was dedicated to ambulation and strengthening.   The patient was discharged on 2 days post op in  Good condition.  Blood products given:none  DIAGNOSTIC STUDIES: Recent vital signs: No data found.      Recent laboratory studies:  Recent Labs  02/17/16 1223 02/18/16 0456 02/19/16 0550  WBC 16.5* 13.7* 12.2*  HGB 13.0 11.5* 10.8*  HCT 40.1 36.1 33.6*  PLT 312 283 283    Recent Labs  02/17/16 1223 02/18/16 0456  NA  --  137  K  --  3.8  CL  --  102  CO2  --  28  BUN  --  8  CREATININE 0.66 0.59  GLUCOSE  --  152*  CALCIUM  --  8.5*   Lab Results  Component Value Date   INR 1.08 02/06/2016     Recent Radiographic Studies :  Dg  Chest 2 View  02/06/2016  CLINICAL DATA:  Arthroplasty. EXAM: CHEST  2 VIEW COMPARISON:  07/26/2014 . FINDINGS: No acute cardiopulmonary disease. Heart size normal. No pleural effusion or pneumothorax. Mild pectus deformity. Degenerative changes thoracic spine. IMPRESSION: No acute cardiopulmonary disease. Electronically Signed   By: Maisie Fushomas  Register   On: 02/06/2016 10:22    DISCHARGE INSTRUCTIONS: Discharge Instructions    CPM    Complete by:  As directed   Continuous passive motion machine (CPM):      Use the CPM from 0 to 90 for 4-6 hours per day.      You may increase by 10 per day.  You may break it up into 2 or 3 sessions per day.      Use CPM for 2 weeks or until you are told to stop.     Call MD / Call 911    Complete by:  As directed   If you experience chest pain or shortness of breath, CALL 911 and be transported to the hospital emergency room.  If you develope a fever above 101 F, pus (white drainage) or increased drainage or redness at the wound, or calf  pain, call your surgeon's office.     Change dressing    Complete by:  As directed   Change dressing on tomorrow, then change the dressing daily with sterile 4 x 4 inch gauze dressing and apply TED hose.  You may clean the incision with alcohol prior to redressing.     Constipation Prevention    Complete by:  As directed   Drink plenty of fluids.  Prune juice may be helpful.  You may use a stool softener, such as Colace (over the counter) 100 mg twice a day.  Use MiraLax (over the counter) for constipation as needed.     Diet - low sodium heart healthy    Complete by:  As directed      Discharge instructions    Complete by:  As directed   INSTRUCTIONS AFTER JOINT REPLACEMENT   Remove items at home which could result in a fall. This includes throw rugs or furniture in walking pathways ICE to the affected joint every three hours while awake for 30 minutes at a time, for at least the first 3-5 days, and then as needed for pain and swelling.  Continue to use ice for pain and swelling. You may notice swelling that will progress down to the foot and ankle.  This is normal after surgery.  Elevate your leg when you are not up walking on it.   Continue to use the breathing machine you got in the hospital (incentive spirometer) which will help keep your temperature down.  It is common for your temperature to cycle up and down following surgery, especially at night when you are not up moving around and exerting yourself.  The breathing machine keeps your lungs expanded and your temperature down.   DIET:  As you were doing prior to hospitalization, we recommend a well-balanced diet.  DRESSING / WOUND CARE / SHOWERING  You may change your dressing 3-5 days after surgery.  Then change the dressing every day with sterile gauze.  Please use good hand washing techniques before changing the dressing.  Do not use any lotions or creams on the incision until instructed by your surgeon.  ACTIVITY  Increase  activity slowly as tolerated, but follow the weight bearing instructions below.   No driving for 6 weeks or until further direction given  by your physician.  You cannot drive while taking narcotics.  No lifting or carrying greater than 10 lbs. until further directed by your surgeon. Avoid periods of inactivity such as sitting longer than an hour when not asleep. This helps prevent blood clots.  You may return to work once you are authorized by your doctor.     WEIGHT BEARING   Weight bearing as tolerated with assist device (walker, cane, etc) as directed, use it as long as suggested by your surgeon or therapist, typically at least 4-6 weeks.   EXERCISES  Results after joint replacement surgery are often greatly improved when you follow the exercise, range of motion and muscle strengthening exercises prescribed by your doctor. Safety measures are also important to protect the joint from further injury. Any time any of these exercises cause you to have increased pain or swelling, decrease what you are doing until you are comfortable again and then slowly increase them. If you have problems or questions, call your caregiver or physical therapist for advice.   Rehabilitation is important following a joint replacement. After just a few days of immobilization, the muscles of the leg can become weakened and shrink (atrophy).  These exercises are designed to build up the tone and strength of the thigh and leg muscles and to improve motion. Often times heat used for twenty to thirty minutes before working out will loosen up your tissues and help with improving the range of motion but do not use heat for the first two weeks following surgery (sometimes heat can increase post-operative swelling).   These exercises can be done on a training (exercise) mat, on the floor, on a table or on a bed. Use whatever works the best and is most comfortable for you.    Use music or television while you are exercising so  that the exercises are a pleasant break in your day. This will make your life better with the exercises acting as a break in your routine that you can look forward to.   Perform all exercises about fifteen times, three times per day or as directed.  You should exercise both the operative leg and the other leg as well.   Exercises include:   Quad Sets - Tighten up the muscle on the front of the thigh (Quad) and hold for 5-10 seconds.   Straight Leg Raises - With your knee straight (if you were given a brace, keep it on), lift the leg to 60 degrees, hold for 3 seconds, and slowly lower the leg.  Perform this exercise against resistance later as your leg gets stronger.  Leg Slides: Lying on your back, slowly slide your foot toward your buttocks, bending your knee up off the floor (only go as far as is comfortable). Then slowly slide your foot back down until your leg is flat on the floor again.  Angel Wings: Lying on your back spread your legs to the side as far apart as you can without causing discomfort.  Hamstring Strength:  Lying on your back, push your heel against the floor with your leg straight by tightening up the muscles of your buttocks.  Repeat, but this time bend your knee to a comfortable angle, and push your heel against the floor.  You may put a pillow under the heel to make it more comfortable if necessary.   A rehabilitation program following joint replacement surgery can speed recovery and prevent re-injury in the future due to weakened muscles. Contact your doctor or  a physical therapist for more information on knee rehabilitation.    CONSTIPATION  Constipation is defined medically as fewer than three stools per week and severe constipation as less than one stool per week.  Even if you have a regular bowel pattern at home, your normal regimen is likely to be disrupted due to multiple reasons following surgery.  Combination of anesthesia, postoperative narcotics, change in appetite and  fluid intake all can affect your bowels.   YOU MUST use at least one of the following options; they are listed in order of increasing strength to get the job done.  They are all available over the counter, and you may need to use some, POSSIBLY even all of these options:    Drink plenty of fluids (prune juice may be helpful) and high fiber foods Colace 100 mg by mouth twice a day  Senokot for constipation as directed and as needed Dulcolax (bisacodyl), take with full glass of water  Miralax (polyethylene glycol) once or twice a day as needed.  If you have tried all these things and are unable to have a bowel movement in the first 3-4 days after surgery call either your surgeon or your primary doctor.    If you experience loose stools or diarrhea, hold the medications until you stool forms back up.  If your symptoms do not get better within 1 week or if they get worse, check with your doctor.  If you experience "the worst abdominal pain ever" or develop nausea or vomiting, please contact the office immediately for further recommendations for treatment.   ITCHING:  If you experience itching with your medications, try taking only a single pain pill, or even half a pain pill at a time.  You can also use Benadryl over the counter for itching or also to help with sleep.   TED HOSE STOCKINGS:  Use stockings on both legs until for at least 2 weeks or as directed by physician office. They may be removed at night for sleeping.  MEDICATIONS:  See your medication summary on the "After Visit Summary" that nursing will review with you.  You may have some home medications which will be placed on hold until you complete the course of blood thinner medication.  It is important for you to complete the blood thinner medication as prescribed.  PRECAUTIONS:  If you experience chest pain or shortness of breath - call 911 immediately for transfer to the hospital emergency department.   If you develop a fever greater  that 101 F, purulent drainage from wound, increased redness or drainage from wound, foul odor from the wound/dressing, or calf pain - CONTACT YOUR SURGEON.                                                   FOLLOW-UP APPOINTMENTS:  If you do not already have a post-op appointment, please call the office for an appointment to be seen by your surgeon.  Guidelines for how soon to be seen are listed in your "After Visit Summary", but are typically between 1-4 weeks after surgery.  OTHER INSTRUCTIONS:   Knee Replacement:  Do not place pillow under knee, focus on keeping the knee straight while resting. CPM instructions: 0-90 degrees, 2 hours in the morning, 2 hours in the afternoon, and 2 hours in the evening. Place foam block, curve  side up under heel at all times except when in CPM or when walking.  DO NOT modify, tear, cut, or change the foam block in any way.  MAKE SURE YOU:  Understand these instructions.  Get help right away if you are not doing well or get worse.    Thank you for letting us be a part of your medical care team.  It is a privilege we respect greatly.  We hope these instructions will help you stay on track for a fast and full recovery!     Driving restrictions    Complete by:  As directed   No driving for at least 2 weeks     Increase activity slowly as tolerated    Complete by:  As directed            DISCHARGE MEDICATIONS:     Medication List    TAKE these medications        enoxaparin 40 MG/0.4ML injection  Commonly known as:  LOVENOX  Inject 0.4 mLs (40 mg total) into the skin daily.     hydrochlorothiazide 25 MG tablet  Commonly known as:  HYDRODIURIL  Take 25 mg by mouth daily.     HYDROcodone-acetaminophen 10-325 MG tablet  Commonly known as:  NORCO  Take 1 tablet by mouth every 6 (six) hours as needed.     levothyroxine 125 MCG tablet  Commonly known as:  SYNTHROID, LEVOTHROID  Take 125 mcg by mouth daily before breakfast.     meloxicam 15 MG  tablet  Commonly known as:  MOBIC  Take 15 mg by mouth daily.     methocarbamol 500 MG tablet  Commonly known as:  ROBAXIN  Take 1-2 tablets (500-1,000 mg total) by mouth every 6 (six) hours as needed for muscle spasms.     omeprazole 20 MG capsule  Commonly known as:  PRILOSEC  Take 1 capsule (20 mg total) by mouth daily.     ondansetron 4 MG tablet  Commonly known as:  ZOFRAN  Take 1 tablet (4 mg total) by mouth every 6 (six) hours as needed for nausea.        FOLLOW UP VISIT:       Follow-up Information    Follow up with [facility].   Why:  Someone from [facility] will contact you concerning start date and time for therapy.   Contact information:   [address] [place] [address] [phone]       Follow up with Raymon Mutton, MD. Call on 03/03/2016.   Specialty:  Orthopedic Surgery   Contact information:   [address] [place] [address] [phone]       DISPOSITION: HOME VS. SNF  CONDITION:  Good   Guy Sandifer 02/23/2016, 10:30 AM

## 2016-04-29 ENCOUNTER — Other Ambulatory Visit: Payer: Self-pay | Admitting: Orthopedic Surgery

## 2016-04-30 NOTE — Pre-Procedure Instructions (Addendum)
Tiffany FleetingLaurie E Sek  04/30/2016      [facility] [address]- [place], [place] - [address] [place] [id number] Phone: [phone] Fax: [phone]    Your procedure is scheduled on Monday May 11, 2016.  [facility]25 [facility].M.  Call this number if you have problems the morning of surgery:  [phone]   Remember:  Do not eat food or drink liquids after midnight.   Take these medicines the morning of surgery with A SIP OF WATER Levothyroxine (Synthroid),    STOP taking Goody's BC powder, Advil, Aleve, and Herbal Medications.,all vitamins(D,magnesium), meloxicam,aspirin starting 6/7.17   Do not wear jewelry, make-up or nail polish.  Do not wear lotions, powders, or perfumes.  You may not wear deodorant.  Do not shave 48 hours prior to surgery.    Do not bring valuables to the hospital.  [facility] is not responsible for any belongings or valuables.  Contacts, dentures or bridgework may not be worn into surgery.  Leave your suitcase in the car.  After surgery it may be brought to your room.  For patients admitted to the hospital, discharge time will be determined by your treatment team.   Special instructions:   [facility]- Preparing For Surgery  Before surgery, you can play an important role. Because skin is not sterile, your skin needs to be as free of germs as possible. You can reduce the number of germs on your skin by washing with CHG (chlorahexidine gluconate) Soap before surgery.  CHG is an antiseptic cleaner which kills germs and bonds with the skin to continue killing germs even after washing.  Please do not use if you have an allergy to CHG or antibacterial soaps. If your skin becomes reddened/irritated stop using the CHG.  Do not shave (including legs and underarms) for at least 48 hours prior to first CHG shower. It is OK to shave your face.  Please follow these instructions  carefully.   1. Shower the NIGHT BEFORE SURGERY and the MORNING OF SURGERY with CHG.   2. If you chose to wash your hair, wash your hair first as usual with your normal shampoo.  3. After you shampoo, rinse your hair and body thoroughly to remove the shampoo.  4. Use CHG as you would any other liquid soap. You can apply CHG directly to the skin and wash gently with a scrungie or a clean washcloth.   5. Apply the CHG Soap to your body ONLY FROM THE NECK DOWN.  Do not use on open wounds or open sores. Avoid contact with your eyes, ears, mouth and genitals (private parts). Wash genitals (private parts) with your normal soap.  6. Wash thoroughly, paying special attention to the area where your surgery will be performed.  7. Thoroughly rinse your body with warm water from the neck down.  8. DO NOT shower/wash with your normal soap after using and rinsing off the CHG Soap.  9. Pat yourself dry with a CLEAN TOWEL.   10. Wear CLEAN PAJAMAS   11. Place CLEAN SHEETS on your bed the night of your first shower and DO NOT SLEEP WITH PETS.    Day of Surgery: Do not apply any deodorants/lotions. Please wear clean clothes to the hospital/surgery center.      Please read over the following fact sheets that you were given. Coughing and Deep Breathing, Blood Transfusion Information, MRSA Information and Surgical Site Infection  Prevention

## 2016-05-01 ENCOUNTER — Encounter (HOSPITAL_COMMUNITY): Payer: Self-pay

## 2016-05-01 ENCOUNTER — Encounter (HOSPITAL_COMMUNITY)
Admission: RE | Admit: 2016-05-01 | Discharge: 2016-05-01 | Disposition: A | Discharge status: Home / Self Care | Payer: BLUE CROSS/BLUE SHIELD | Source: Ambulatory Visit | Attending: Orthopedic Surgery | Admitting: Orthopedic Surgery

## 2016-05-01 DIAGNOSIS — Z01812 Encounter for preprocedural laboratory examination: Secondary | ICD-10-CM | POA: Diagnosis not present

## 2016-05-01 DIAGNOSIS — M1711 Unilateral primary osteoarthritis, right knee: Secondary | ICD-10-CM | POA: Insufficient documentation

## 2016-05-01 HISTORY — DX: Hypothyroidism, unspecified: E03.9

## 2016-05-01 LAB — CBC WITH DIFFERENTIAL/PLATELET
Basophils Absolute: 0 10*3/uL (ref 0.0–0.1)
Basophils Relative: 0 %
EOS PCT: 3 %
Eosinophils Absolute: 0.4 10*3/uL (ref 0.0–0.7)
HCT: 45 % (ref 36.0–46.0)
Hemoglobin: 14.2 g/dL (ref 12.0–15.0)
LYMPHS ABS: 3 10*3/uL (ref 0.7–4.0)
LYMPHS PCT: 22 %
MCH: 29.4 pg (ref 26.0–34.0)
MCHC: 31.6 g/dL (ref 30.0–36.0)
MCV: 93.2 fL (ref 78.0–100.0)
Monocytes Absolute: 0.8 10*3/uL (ref 0.1–1.0)
Monocytes Relative: 6 %
Neutro Abs: 9.5 10*3/uL — ABNORMAL HIGH (ref 1.7–7.7)
Neutrophils Relative %: 69 %
PLATELETS: 429 10*3/uL — AB (ref 150–400)
RBC: 4.83 MIL/uL (ref 3.87–5.11)
RDW: 13.4 % (ref 11.5–15.5)
WBC: 13.7 10*3/uL — AB (ref 4.0–10.5)

## 2016-05-01 LAB — COMPREHENSIVE METABOLIC PANEL
ALK PHOS: 70 U/L (ref 38–126)
ALT: 16 U/L (ref 14–54)
AST: 17 U/L (ref 15–41)
Albumin: 3.8 g/dL (ref 3.5–5.0)
Anion gap: 7 (ref 5–15)
BILIRUBIN TOTAL: 0.4 mg/dL (ref 0.3–1.2)
BUN: 12 mg/dL (ref 6–20)
CALCIUM: 9.7 mg/dL (ref 8.9–10.3)
CHLORIDE: 100 mmol/L — AB (ref 101–111)
CO2: 30 mmol/L (ref 22–32)
CREATININE: 0.75 mg/dL (ref 0.44–1.00)
Glucose, Bld: 116 mg/dL — ABNORMAL HIGH (ref 65–99)
Potassium: 3.9 mmol/L (ref 3.5–5.1)
Sodium: 137 mmol/L (ref 135–145)
TOTAL PROTEIN: 7.7 g/dL (ref 6.5–8.1)

## 2016-05-01 LAB — URINALYSIS, ROUTINE W REFLEX MICROSCOPIC
BILIRUBIN URINE: NEGATIVE
Glucose, UA: NEGATIVE mg/dL
HGB URINE DIPSTICK: NEGATIVE
KETONES UR: NEGATIVE mg/dL
Leukocytes, UA: NEGATIVE
NITRITE: NEGATIVE
PROTEIN: NEGATIVE mg/dL
Specific Gravity, Urine: 1.017 (ref 1.005–1.030)
pH: 7 (ref 5.0–8.0)

## 2016-05-01 LAB — SURGICAL PCR SCREEN
MRSA, PCR: NEGATIVE
Staphylococcus aureus: NEGATIVE

## 2016-05-01 LAB — PROTIME-INR
INR: 1.05 (ref 0.00–1.49)
PROTHROMBIN TIME: 13.9 s (ref 11.6–15.2)

## 2016-05-01 LAB — APTT: aPTT: 30 seconds (ref 24–37)

## 2016-05-02 LAB — URINE CULTURE: CULTURE: NO GROWTH

## 2016-05-08 MED ORDER — TRANEXAMIC ACID 1000 MG/10ML IV SOLN
1000.0000 mg | INTRAVENOUS | Status: AC
  Administered 2016-05-11: 1000 mg via INTRAVENOUS
  Filled 2016-05-08: qty 10

## 2016-05-08 MED ORDER — ACETAMINOPHEN 500 MG PO TABS
1000.0000 mg | ORAL_TABLET | Freq: Once | ORAL | Status: AC
  Administered 2016-05-11: 1000 mg via ORAL
  Filled 2016-05-08: qty 2

## 2016-05-08 MED ORDER — SODIUM CHLORIDE 0.9 % IV SOLN: INTRAVENOUS | Status: DC

## 2016-05-08 MED ORDER — CEFAZOLIN SODIUM-DEXTROSE 2-4 GM/100ML-% IV SOLN
2.0000 g | INTRAVENOUS | Status: AC
  Administered 2016-05-11: 2 g via INTRAVENOUS
  Filled 2016-05-08: qty 100

## 2016-05-10 NOTE — Anesthesia Preprocedure Evaluation (Addendum)
Anesthesia Evaluation  Patient identified by MRN, date of birth, ID band Patient awake    Reviewed: Allergy & Precautions, NPO status , Patient's Chart, lab work & pertinent test results  Airway Mallampati: I  TM Distance: >3 FB Neck ROM: Full    Dental no notable dental hx. (+) Dental Advisory Given, Partial Upper, Teeth Intact   Pulmonary Current Smoker,    Pulmonary exam normal        Cardiovascular hypertension, Pt. on medications Normal cardiovascular exam     Neuro/Psych Back surgery negative neurological ROS  negative psych ROS   GI/Hepatic negative GI ROS, Neg liver ROS,   Endo/Other  Hypothyroidism   Renal/GU negative Renal ROS  negative genitourinary   Musculoskeletal  (+) Arthritis ,   Abdominal Normal abdominal exam  (+)   Peds  Hematology   Anesthesia Other Findings   Reproductive/Obstetrics                           Anesthesia Physical Anesthesia Plan  ASA: II  Anesthesia Plan: Spinal   Post-op Pain Management:    Induction:   Airway Management Planned:   Additional Equipment:   Intra-op Plan:   Post-operative Plan:   Informed Consent:   Plan Discussed with: CRNA and Surgeon  Anesthesia Plan Comments:        Anesthesia Quick Evaluation

## 2016-05-11 ENCOUNTER — Encounter (HOSPITAL_COMMUNITY)
Admission: RE | Disposition: A | Discharge status: Home Health | Payer: Self-pay | Source: Ambulatory Visit | Attending: Orthopedic Surgery

## 2016-05-11 ENCOUNTER — Inpatient Hospital Stay (HOSPITAL_COMMUNITY)
Admission: RE | Admit: 2016-05-11 | Discharge: 2016-05-12 | DRG: 470 | Disposition: A | Discharge status: Home Health | Payer: BLUE CROSS/BLUE SHIELD | Source: Ambulatory Visit | Attending: Orthopedic Surgery | Admitting: Orthopedic Surgery

## 2016-05-11 ENCOUNTER — Inpatient Hospital Stay (HOSPITAL_COMMUNITY): Payer: BLUE CROSS/BLUE SHIELD | Admitting: Anesthesiology

## 2016-05-11 ENCOUNTER — Encounter (HOSPITAL_COMMUNITY): Payer: Self-pay | Admitting: *Deleted

## 2016-05-11 DIAGNOSIS — Z96652 Presence of left artificial knee joint: Secondary | ICD-10-CM | POA: Diagnosis present

## 2016-05-11 DIAGNOSIS — M1711 Unilateral primary osteoarthritis, right knee: Secondary | ICD-10-CM | POA: Diagnosis present

## 2016-05-11 DIAGNOSIS — E039 Hypothyroidism, unspecified: Secondary | ICD-10-CM | POA: Diagnosis present

## 2016-05-11 DIAGNOSIS — Z79899 Other long term (current) drug therapy: Secondary | ICD-10-CM | POA: Diagnosis not present

## 2016-05-11 DIAGNOSIS — Z96659 Presence of unspecified artificial knee joint: Secondary | ICD-10-CM

## 2016-05-11 DIAGNOSIS — D62 Acute posthemorrhagic anemia: Secondary | ICD-10-CM | POA: Diagnosis not present

## 2016-05-11 DIAGNOSIS — F17210 Nicotine dependence, cigarettes, uncomplicated: Secondary | ICD-10-CM | POA: Diagnosis present

## 2016-05-11 DIAGNOSIS — I10 Essential (primary) hypertension: Secondary | ICD-10-CM | POA: Diagnosis present

## 2016-05-11 HISTORY — PX: TOTAL KNEE ARTHROPLASTY: SHX125

## 2016-05-11 LAB — CREATININE, SERUM: Creatinine, Ser: 0.62 mg/dL (ref 0.44–1.00)

## 2016-05-11 LAB — CBC
HEMATOCRIT: 39.9 % (ref 36.0–46.0)
HEMOGLOBIN: 12.7 g/dL (ref 12.0–15.0)
MCH: 29.2 pg (ref 26.0–34.0)
MCHC: 31.8 g/dL (ref 30.0–36.0)
MCV: 91.7 fL (ref 78.0–100.0)
Platelets: 346 10*3/uL (ref 150–400)
RBC: 4.35 MIL/uL (ref 3.87–5.11)
RDW: 13.3 % (ref 11.5–15.5)
WBC: 15.1 10*3/uL — AB (ref 4.0–10.5)

## 2016-05-11 SURGERY — ARTHROPLASTY, KNEE, TOTAL
Anesthesia: Spinal | Laterality: Right

## 2016-05-11 MED ORDER — CHLORHEXIDINE GLUCONATE 4 % EX LIQD: 60.0000 mL | Freq: Once | CUTANEOUS | Status: DC

## 2016-05-11 MED ORDER — BUPIVACAINE-EPINEPHRINE (PF) 0.25% -1:200000 IJ SOLN
INTRAMUSCULAR | Status: AC
  Filled 2016-05-11: qty 30

## 2016-05-11 MED ORDER — ONDANSETRON HCL 4 MG/2ML IJ SOLN
INTRAMUSCULAR | Status: AC
  Filled 2016-05-11: qty 2

## 2016-05-11 MED ORDER — KETOROLAC TROMETHAMINE 30 MG/ML IJ SOLN
30.0000 mg | Freq: Once | INTRAMUSCULAR | Status: AC
  Administered 2016-05-11: 30 mg via INTRAVENOUS

## 2016-05-11 MED ORDER — ENOXAPARIN SODIUM 30 MG/0.3ML ~~LOC~~ SOLN
30.0000 mg | Freq: Two times a day (BID) | SUBCUTANEOUS | Status: DC
  Administered 2016-05-12: 30 mg via SUBCUTANEOUS
  Filled 2016-05-11: qty 0.3

## 2016-05-11 MED ORDER — ONDANSETRON HCL 4 MG/2ML IJ SOLN: 4.0000 mg | Freq: Once | INTRAMUSCULAR | Status: DC | PRN

## 2016-05-11 MED ORDER — FLEET ENEMA 7-19 GM/118ML RE ENEM: 1.0000 | ENEMA | Freq: Once | RECTAL | Status: DC | PRN

## 2016-05-11 MED ORDER — LACTATED RINGERS IV SOLN: INTRAVENOUS | Status: DC

## 2016-05-11 MED ORDER — METHOCARBAMOL 500 MG PO TABS
500.0000 mg | ORAL_TABLET | Freq: Four times a day (QID) | ORAL | Status: DC | PRN
  Administered 2016-05-11 – 2016-05-12 (×3): 500 mg via ORAL
  Filled 2016-05-11 (×4): qty 1

## 2016-05-11 MED ORDER — PROPOFOL 10 MG/ML IV BOLUS
INTRAVENOUS | Status: DC | PRN
  Administered 2016-05-11 (×2): 20 mg via INTRAVENOUS

## 2016-05-11 MED ORDER — DEXAMETHASONE SODIUM PHOSPHATE 10 MG/ML IJ SOLN
INTRAMUSCULAR | Status: DC | PRN
  Administered 2016-05-11: 10 mg via INTRAVENOUS

## 2016-05-11 MED ORDER — PHENOL 1.4 % MT LIQD: 1.0000 | OROMUCOSAL | Status: DC | PRN

## 2016-05-11 MED ORDER — SODIUM CHLORIDE 0.9 % IV SOLN
10.0000 mg | INTRAVENOUS | Status: DC | PRN
  Administered 2016-05-11: 25 ug/min via INTRAVENOUS

## 2016-05-11 MED ORDER — ONDANSETRON HCL 4 MG/2ML IJ SOLN: 4.0000 mg | Freq: Four times a day (QID) | INTRAMUSCULAR | Status: DC | PRN

## 2016-05-11 MED ORDER — CEFAZOLIN SODIUM-DEXTROSE 2-4 GM/100ML-% IV SOLN
2.0000 g | Freq: Four times a day (QID) | INTRAVENOUS | Status: AC
  Administered 2016-05-11 (×2): 2 g via INTRAVENOUS
  Filled 2016-05-11 (×2): qty 100

## 2016-05-11 MED ORDER — LACTATED RINGERS IV SOLN
INTRAVENOUS | Status: DC | PRN
  Administered 2016-05-11 (×3): via INTRAVENOUS

## 2016-05-11 MED ORDER — ACETAMINOPHEN 325 MG PO TABS: 650.0000 mg | ORAL_TABLET | Freq: Four times a day (QID) | ORAL | Status: DC | PRN

## 2016-05-11 MED ORDER — BUPIVACAINE HCL (PF) 0.25 % IJ SOLN
INTRAMUSCULAR | Status: AC
  Filled 2016-05-11: qty 30

## 2016-05-11 MED ORDER — TRANEXAMIC ACID 1000 MG/10ML IV SOLN
1000.0000 mg | Freq: Once | INTRAVENOUS | Status: AC
  Administered 2016-05-11: 1000 mg via INTRAVENOUS
  Filled 2016-05-11: qty 10

## 2016-05-11 MED ORDER — MIDAZOLAM HCL 2 MG/2ML IJ SOLN
INTRAMUSCULAR | Status: DC | PRN
  Administered 2016-05-11 (×2): 1 mg via INTRAVENOUS

## 2016-05-11 MED ORDER — LIDOCAINE HCL (CARDIAC) 20 MG/ML IV SOLN
INTRAVENOUS | Status: DC | PRN
  Administered 2016-05-11: 20 mg via INTRAVENOUS

## 2016-05-11 MED ORDER — 0.9 % SODIUM CHLORIDE (POUR BTL) OPTIME
TOPICAL | Status: DC | PRN
  Administered 2016-05-11: 1000 mL

## 2016-05-11 MED ORDER — DEXAMETHASONE SODIUM PHOSPHATE 10 MG/ML IJ SOLN
INTRAMUSCULAR | Status: AC
  Filled 2016-05-11: qty 1

## 2016-05-11 MED ORDER — MEPERIDINE HCL 25 MG/ML IJ SOLN: 6.2500 mg | INTRAMUSCULAR | Status: DC | PRN

## 2016-05-11 MED ORDER — METOCLOPRAMIDE HCL 5 MG/ML IJ SOLN: 5.0000 mg | Freq: Three times a day (TID) | INTRAMUSCULAR | Status: DC | PRN

## 2016-05-11 MED ORDER — SENNOSIDES-DOCUSATE SODIUM 8.6-50 MG PO TABS: 1.0000 | ORAL_TABLET | Freq: Every evening | ORAL | Status: DC | PRN

## 2016-05-11 MED ORDER — OXYCODONE HCL ER 10 MG PO T12A
10.0000 mg | EXTENDED_RELEASE_TABLET | Freq: Two times a day (BID) | ORAL | Status: DC
  Administered 2016-05-11 – 2016-05-12 (×3): 10 mg via ORAL
  Filled 2016-05-11 (×3): qty 1

## 2016-05-11 MED ORDER — METHOCARBAMOL 1000 MG/10ML IJ SOLN: 500.0000 mg | Freq: Four times a day (QID) | INTRAVENOUS | Status: DC | PRN

## 2016-05-11 MED ORDER — ONDANSETRON HCL 4 MG PO TABS: 4.0000 mg | ORAL_TABLET | Freq: Four times a day (QID) | ORAL | Status: DC | PRN

## 2016-05-11 MED ORDER — HYDROMORPHONE HCL 1 MG/ML IJ SOLN: 1.0000 mg | INTRAMUSCULAR | Status: DC | PRN

## 2016-05-11 MED ORDER — CELECOXIB 200 MG PO CAPS
200.0000 mg | ORAL_CAPSULE | Freq: Two times a day (BID) | ORAL | Status: DC
  Administered 2016-05-11 – 2016-05-12 (×2): 200 mg via ORAL
  Filled 2016-05-11 (×3): qty 1

## 2016-05-11 MED ORDER — OXYCODONE HCL 5 MG PO TABS
5.0000 mg | ORAL_TABLET | ORAL | Status: DC | PRN
  Administered 2016-05-11 – 2016-05-12 (×7): 10 mg via ORAL
  Filled 2016-05-11 (×7): qty 2

## 2016-05-11 MED ORDER — SODIUM CHLORIDE 0.9 % IR SOLN
Status: DC | PRN
  Administered 2016-05-11: 3000 mL

## 2016-05-11 MED ORDER — METOCLOPRAMIDE HCL 5 MG PO TABS: 5.0000 mg | ORAL_TABLET | Freq: Three times a day (TID) | ORAL | Status: DC | PRN

## 2016-05-11 MED ORDER — SODIUM CHLORIDE 0.9 % IV SOLN
INTRAVENOUS | Status: DC
  Administered 2016-05-11: 15:00:00 via INTRAVENOUS

## 2016-05-11 MED ORDER — ROCURONIUM BROMIDE 50 MG/5ML IV SOLN
INTRAVENOUS | Status: AC
  Filled 2016-05-11: qty 1

## 2016-05-11 MED ORDER — FENTANYL CITRATE (PF) 250 MCG/5ML IJ SOLN
INTRAMUSCULAR | Status: DC | PRN
  Administered 2016-05-11 (×2): 50 ug via INTRAVENOUS

## 2016-05-11 MED ORDER — FENTANYL CITRATE (PF) 250 MCG/5ML IJ SOLN
INTRAMUSCULAR | Status: AC
  Filled 2016-05-11: qty 5

## 2016-05-11 MED ORDER — ONDANSETRON HCL 4 MG/2ML IJ SOLN
INTRAMUSCULAR | Status: DC | PRN
  Administered 2016-05-11: 4 mg via INTRAVENOUS

## 2016-05-11 MED ORDER — LIDOCAINE 2% (20 MG/ML) 5 ML SYRINGE
INTRAMUSCULAR | Status: AC
  Filled 2016-05-11: qty 5

## 2016-05-11 MED ORDER — PROPOFOL 10 MG/ML IV BOLUS
INTRAVENOUS | Status: AC
  Filled 2016-05-11: qty 20

## 2016-05-11 MED ORDER — MENTHOL 3 MG MT LOZG: 1.0000 | LOZENGE | OROMUCOSAL | Status: DC | PRN

## 2016-05-11 MED ORDER — ALUM & MAG HYDROXIDE-SIMETH 200-200-20 MG/5ML PO SUSP: 30.0000 mL | ORAL | Status: DC | PRN

## 2016-05-11 MED ORDER — BUPIVACAINE LIPOSOME 1.3 % IJ SUSP
INTRAMUSCULAR | Status: DC | PRN
  Administered 2016-05-11: 20 mL

## 2016-05-11 MED ORDER — DOCUSATE SODIUM 100 MG PO CAPS
100.0000 mg | ORAL_CAPSULE | Freq: Two times a day (BID) | ORAL | Status: DC
  Administered 2016-05-11 – 2016-05-12 (×2): 100 mg via ORAL
  Filled 2016-05-11 (×3): qty 1

## 2016-05-11 MED ORDER — MIDAZOLAM HCL 2 MG/2ML IJ SOLN
INTRAMUSCULAR | Status: AC
  Filled 2016-05-11: qty 2

## 2016-05-11 MED ORDER — ACETAMINOPHEN 650 MG RE SUPP: 650.0000 mg | Freq: Four times a day (QID) | RECTAL | Status: DC | PRN

## 2016-05-11 MED ORDER — KETOROLAC TROMETHAMINE 30 MG/ML IJ SOLN
INTRAMUSCULAR | Status: AC
  Filled 2016-05-11: qty 1

## 2016-05-11 MED ORDER — BISACODYL 5 MG PO TBEC
5.0000 mg | DELAYED_RELEASE_TABLET | Freq: Every day | ORAL | Status: DC | PRN
  Administered 2016-05-11: 5 mg via ORAL
  Filled 2016-05-11: qty 1

## 2016-05-11 MED ORDER — BUPIVACAINE LIPOSOME 1.3 % IJ SUSP
20.0000 mL | INTRAMUSCULAR | Status: DC
  Filled 2016-05-11: qty 20

## 2016-05-11 MED ORDER — PROPOFOL 500 MG/50ML IV EMUL
INTRAVENOUS | Status: DC | PRN
  Administered 2016-05-11: 25 ug/kg/min via INTRAVENOUS

## 2016-05-11 MED ORDER — LEVOTHYROXINE SODIUM 25 MCG PO TABS
125.0000 ug | ORAL_TABLET | Freq: Every day | ORAL | Status: DC
  Administered 2016-05-12: 125 ug via ORAL
  Filled 2016-05-11: qty 1

## 2016-05-11 MED ORDER — SODIUM CHLORIDE 0.9 % IJ SOLN
INTRAMUSCULAR | Status: DC | PRN
  Administered 2016-05-11: 20 mL via INTRAVENOUS

## 2016-05-11 MED ORDER — BUPIVACAINE IN DEXTROSE 0.75-8.25 % IT SOLN
INTRATHECAL | Status: DC | PRN
  Administered 2016-05-11: 12 mg via INTRATHECAL

## 2016-05-11 MED ORDER — HYDROMORPHONE HCL 1 MG/ML IJ SOLN: 0.2500 mg | INTRAMUSCULAR | Status: DC | PRN

## 2016-05-11 MED ORDER — DIPHENHYDRAMINE HCL 12.5 MG/5ML PO ELIX
12.5000 mg | ORAL_SOLUTION | ORAL | Status: DC | PRN
  Administered 2016-05-12: 25 mg via ORAL
  Filled 2016-05-11: qty 10

## 2016-05-11 MED ORDER — HYDROCHLOROTHIAZIDE 25 MG PO TABS
25.0000 mg | ORAL_TABLET | Freq: Every day | ORAL | Status: DC
  Administered 2016-05-11 – 2016-05-12 (×2): 25 mg via ORAL
  Filled 2016-05-11 (×2): qty 1

## 2016-05-11 MED ORDER — BUPIVACAINE-EPINEPHRINE (PF) 0.25% -1:200000 IJ SOLN
INTRAMUSCULAR | Status: DC | PRN
  Administered 2016-05-11: 20 mL via PERINEURAL

## 2016-05-11 MED ORDER — ZOLPIDEM TARTRATE 5 MG PO TABS: 5.0000 mg | ORAL_TABLET | Freq: Every evening | ORAL | Status: DC | PRN

## 2016-05-11 SURGICAL SUPPLY — 63 items
BANDAGE ELASTIC 6 VELCRO ST LF (GAUZE/BANDAGES/DRESSINGS) ×2 IMPLANT
BANDAGE ESMARK 6X9 LF (GAUZE/BANDAGES/DRESSINGS) ×1 IMPLANT
BLADE SAGITTAL 13X1.27X60 (BLADE) ×2 IMPLANT
BLADE SAW SGTL 83.5X18.5 (BLADE) ×2 IMPLANT
BLADE SURG 10 STRL SS (BLADE) ×2 IMPLANT
BNDG ESMARK 6X9 LF (GAUZE/BANDAGES/DRESSINGS) ×2
BOWL SMART MIX CTS (DISPOSABLE) ×2 IMPLANT
CAPT KNEE TOTAL 3 ×2 IMPLANT
CEMENT BONE SIMPLEX SPEEDSET (Cement) ×4 IMPLANT
COVER SURGICAL LIGHT HANDLE (MISCELLANEOUS) ×2 IMPLANT
CUFF TOURNIQUET SINGLE 34IN LL (TOURNIQUET CUFF) ×2 IMPLANT
DRAPE EXTREMITY T 121X128X90 (DRAPE) ×2 IMPLANT
DRAPE INCISE IOBAN 66X45 STRL (DRAPES) ×4 IMPLANT
DRAPE PROXIMA HALF (DRAPES) IMPLANT
DRAPE U-SHAPE 47X51 STRL (DRAPES) ×2 IMPLANT
DRESSING AQUACEL AG SP 3.5X10 (GAUZE/BANDAGES/DRESSINGS) ×1 IMPLANT
DRSG ADAPTIC 3X8 NADH LF (GAUZE/BANDAGES/DRESSINGS) ×2 IMPLANT
DRSG AQUACEL AG SP 3.5X10 (GAUZE/BANDAGES/DRESSINGS) ×2
DRSG PAD ABDOMINAL 8X10 ST (GAUZE/BANDAGES/DRESSINGS) IMPLANT
DURAPREP 26ML APPLICATOR (WOUND CARE) ×4 IMPLANT
ELECT REM PT RETURN 9FT ADLT (ELECTROSURGICAL) ×2
ELECTRODE REM PT RTRN 9FT ADLT (ELECTROSURGICAL) ×1 IMPLANT
GAUZE SPONGE 4X4 12PLY STRL (GAUZE/BANDAGES/DRESSINGS) ×2 IMPLANT
GLOVE BIOGEL M 7.0 STRL (GLOVE) ×4 IMPLANT
GLOVE BIOGEL PI IND STRL 7.5 (GLOVE) ×1 IMPLANT
GLOVE BIOGEL PI IND STRL 8.5 (GLOVE) ×3 IMPLANT
GLOVE BIOGEL PI INDICATOR 7.5 (GLOVE) ×1
GLOVE BIOGEL PI INDICATOR 8.5 (GLOVE) ×3
GLOVE SURG ORTHO 8.0 STRL STRW (GLOVE) ×4 IMPLANT
GOWN STRL REUS W/ TWL LRG LVL3 (GOWN DISPOSABLE) ×1 IMPLANT
GOWN STRL REUS W/ TWL XL LVL3 (GOWN DISPOSABLE) ×2 IMPLANT
GOWN STRL REUS W/TWL 2XL LVL3 (GOWN DISPOSABLE) IMPLANT
GOWN STRL REUS W/TWL LRG LVL3 (GOWN DISPOSABLE) ×1
GOWN STRL REUS W/TWL XL LVL3 (GOWN DISPOSABLE) ×2
HANDPIECE INTERPULSE COAX TIP (DISPOSABLE) ×1
HOOD PEEL AWAY FACE SHEILD DIS (HOOD) ×6 IMPLANT
KIT BASIN OR (CUSTOM PROCEDURE TRAY) ×2 IMPLANT
KIT ROOM TURNOVER OR (KITS) ×2 IMPLANT
KNEE CAPITATED TOTAL 3 ×1 IMPLANT
MANIFOLD NEPTUNE II (INSTRUMENTS) ×2 IMPLANT
NEEDLE 22X1 1/2 (OR ONLY) (NEEDLE) ×4 IMPLANT
NS IRRIG 1000ML POUR BTL (IV SOLUTION) ×2 IMPLANT
PACK TOTAL JOINT (CUSTOM PROCEDURE TRAY) ×2 IMPLANT
PACK UNIVERSAL I (CUSTOM PROCEDURE TRAY) ×2 IMPLANT
PAD ARMBOARD 7.5X6 YLW CONV (MISCELLANEOUS) ×4 IMPLANT
PADDING CAST COTTON 6X4 STRL (CAST SUPPLIES) IMPLANT
SET HNDPC FAN SPRY TIP SCT (DISPOSABLE) ×1 IMPLANT
STAPLER VISISTAT 35W (STAPLE) IMPLANT
STRIP CLOSURE SKIN 1/2X4 (GAUZE/BANDAGES/DRESSINGS) ×4 IMPLANT
SUCTION FRAZIER HANDLE 10FR (MISCELLANEOUS) ×1
SUCTION TUBE FRAZIER 10FR DISP (MISCELLANEOUS) ×1 IMPLANT
SUT BONE WAX W31G (SUTURE) ×2 IMPLANT
SUT MNCRL 3 0 RB1 (SUTURE) ×1 IMPLANT
SUT MONOCRYL 3 0 RB1 (SUTURE) ×1
SUT VIC AB 0 CTB1 27 (SUTURE) ×4 IMPLANT
SUT VIC AB 1 CT1 27 (SUTURE) ×2
SUT VIC AB 1 CT1 27XBRD ANBCTR (SUTURE) ×2 IMPLANT
SUT VIC AB 2-0 CT1 27 (SUTURE) ×2
SUT VIC AB 2-0 CT1 TAPERPNT 27 (SUTURE) ×2 IMPLANT
SYR 20CC LL (SYRINGE) ×4 IMPLANT
TOWEL OR 17X24 6PK STRL BLUE (TOWEL DISPOSABLE) ×2 IMPLANT
TOWEL OR 17X26 10 PK STRL BLUE (TOWEL DISPOSABLE) ×2 IMPLANT
WATER STERILE IRR 1000ML POUR (IV SOLUTION) ×4 IMPLANT

## 2016-05-11 NOTE — Anesthesia Postprocedure Evaluation (Signed)
Anesthesia Post Note  Patient: Tiffany Navarro  Procedure(s) Performed: Procedure(s) (LRB): TOTAL KNEE ARTHROPLASTY (Right)  Patient location during evaluation: PACU Anesthesia Type: Spinal Level of consciousness: awake Pain management: pain level controlled Vital Signs Assessment: post-procedure vital signs reviewed and stable Respiratory status: spontaneous breathing Cardiovascular status: stable Postop Assessment: no headache, no backache, spinal receding, patient able to bend at knees and no signs of nausea or vomiting Anesthetic complications: no     Last Vitals:  Filed Vitals:   05/11/16 1050 05/11/16 1115  BP: 99/67 94/57  Pulse: 87 65  Temp: 36.4 C   Resp: 19 11    Last Pain: There were no vitals filed for this visit. Pain Goal:                 Pastor Sgro JR,JOHN Amore Ackman

## 2016-05-11 NOTE — H&P (Signed)
Tiffany Navarro MRN:  [id number] DOB/SEX:  [birth year]/female  CHIEF COMPLAINT:  Painful right Knee  HISTORY: Patient is a 60 y.o. female presented with a history of pain in the right knee. Onset of symptoms was gradual starting a few years ago with gradually worsening course since that time. Patient has been treated conservatively with over-the-counter NSAIDs and activity modification. Patient currently rates pain in the knee at 10 out of 10 with activity. There is pain at night.  PAST MEDICAL HISTORY: Patient Active Problem List   Diagnosis Date Noted  . S/P total knee replacement 02/17/2016  . Hypertriglyceridemia 05/30/2014  . Unspecified hypothyroidism 01/22/2014  . Essential hypertension, benign 01/22/2014  . Osteoarthritis of left knee 01/22/2014   Past Medical History  Diagnosis Date  . Thyroid disease   . Hypertension   . Osteoarthritis   . Hypothyroidism    Past Surgical History  Procedure Laterality Date  . Hip pinning Left 2012    x3   pin removed last surgery  . Total knee arthroplasty Left 02/17/2016    Procedure: TOTAL KNEE ARTHROPLASTY;  Surgeon: Dannielle Huh, MD;  Location: [facility] OR;  Service: Orthopedics;  Laterality: Left;  . Back surgery  82  . Joint replacement       MEDICATIONS:   Prescriptions prior to admission  Medication Sig Dispense Refill Last Dose  . Cholecalciferol (VITAMIN D PO) Take 5,000 Units by mouth daily.      . hydrochlorothiazide (HYDRODIURIL) 25 MG tablet Take 25 mg by mouth daily.   02/16/2016 at Unknown time  . levothyroxine (SYNTHROID, LEVOTHROID) 125 MCG tablet Take 125 mcg by mouth daily before breakfast.   07/26/2014 at 0400  . Magnesium 400 MG CAPS Take 400 mg by mouth daily.     . meloxicam (MOBIC) 15 MG tablet Take 15 mg by mouth daily.   Past Week at Unknown time    ALLERGIES:  No Known Allergies  REVIEW OF SYSTEMS:  A comprehensive review of systems was negative except for: Musculoskeletal: positive for arthralgias, bone pain  and stiff joints   FAMILY HISTORY:   Family History  Problem Relation Age of Onset  . Hypertension Mother   . Hypertension Father   . Stroke Father   . Hypertension Brother     SOCIAL HISTORY:   Social History  Substance Use Topics  . Smoking status: Current Some Day Smoker -- 0.25 packs/day for 40 years    Types: Cigarettes  . Smokeless tobacco: Never Used  . Alcohol Use: No     EXAMINATION:  Vital signs in last 24 hours:    There were no vitals taken for this visit.  General Appearance:    Alert, cooperative, no distress, appears stated age  Head:    Normocephalic, without obvious abnormality, atraumatic  Eyes:    PERRL, conjunctiva/corneas clear, EOM's intact, fundi    benign, both eyes  Ears:    Normal TM's and external ear canals, both ears  Nose:   Nares normal, septum midline, mucosa normal, no drainage    or sinus tenderness  Throat:   Lips, mucosa, and tongue normal; teeth and gums normal  Neck:   Supple, symmetrical, trachea midline, no adenopathy;    thyroid:  no enlargement/tenderness/nodules; no carotid   bruit or JVD  Back:     Symmetric, no curvature, ROM normal, no CVA tenderness  Lungs:     Clear to auscultation bilaterally, respirations unlabored  Chest Wall:    No tenderness or deformity  Heart:    Regular rate and rhythm, S1 and S2 normal, no murmur, rub   or gallop  Breast Exam:    No tenderness, masses, or nipple abnormality  Abdomen:     Soft, non-tender, bowel sounds active all four quadrants,    no masses, no organomegaly  Genitalia:    Normal female without lesion, discharge or tenderness  Rectal:    Normal tone, no masses or tenderness;   guaiac negative stool  Extremities:   Extremities normal, atraumatic, no cyanosis or edema  Pulses:   2+ and symmetric all extremities  Skin:   Skin color, texture, turgor normal, no rashes or lesions  Lymph nodes:   Cervical, supraclavicular, and axillary nodes normal  Neurologic:   CNII-XII intact,  normal strength, sensation and reflexes    throughout    Musculoskeletal:  ROM 0-120, Ligaments intact,  Imaging Review Plain radiographs demonstrate severe degenerative joint disease of the right knee. The overall alignment is neutral. The bone quality appears to be excellent for age and reported activity level.  Assessment/Plan: Primary osteoarthritis, right knee   The patient history, physical examination and imaging studies are consistent with advanced degenerative joint disease of the right knee. The patient has failed conservative treatment.  The clearance notes were reviewed.  After discussion with the patient it was felt that Total Knee Replacement was indicated. The procedure,  risks, and benefits of total knee arthroplasty were presented and reviewed. The risks including but not limited to aseptic loosening, infection, blood clots, vascular injury, stiffness, patella tracking problems complications among others were discussed. The patient acknowledged the explanation, agreed to proceed with the plan.  Guy SandiferColby Alan Kammie Scioli 05/11/2016, 6:16 AM

## 2016-05-11 NOTE — Evaluation (Signed)
Physical Therapy Evaluation Patient Details Name: Tiffany FleetingLaurie E Selph MRN: [id number] DOB: 05/02/1956 Today's Date: 05/11/2016   History of Present Illness  Patient is a 60 y/o female with hx of HTN, thyroid disease, recent L TKA and self reported sciatica s/p right TKA.   Clinical Impression  Patient presents with decreased independence with mobility due to deficits listed in PT problem list.  She will benefit from skilled PT in the acute setting to allow return home with HHPT.     Follow Up Recommendations Home health PT    Equipment Recommendations  None recommended by PT    Recommendations for Other Services       Precautions / Restrictions Precautions Precautions: Fall Restrictions Weight Bearing Restrictions: Yes RLE Weight Bearing: Weight bearing as tolerated      Mobility  Bed Mobility               General bed mobility comments: up on BSC with aide in room   Transfers Overall transfer level: Needs assistance Equipment used: Rolling walker (2 wheeled) Transfers: Sit to/from Stand Sit to Stand: Min assist         General transfer comment[facility]  Ambulation/Gait Ambulation/Gait assistance: Min guard;Supervision Ambulation Distance (Feet): 130 Feet Assistive device: Rolling walker (2 wheeled) Gait Pattern/deviations: Step-through pattern;Decreased stride length;Antalgic     General Gait Details: Assist for balance initially, able to progress to heel toe gait  Stairs            Wheelchair Mobility    Modified Rankin (Stroke Patients Only)       Balance Overall balance assessment: No apparent balance deficits (not formally assessed)                                           Pertinent Vitals/Pain Pain Assessment: 0-10 Pain Score: 4  Pain Location: R knee with movemetn Pain Descriptors / Indicators: Burning Pain Intervention(s): Monitored during session;Repositioned;Ice applied    Home Living  Family/patient expects to be discharged to:: Private residence Living Arrangements: Children;Non-relatives/Friends Available Help at Discharge: Family;Available 24 hours/day Type of Home: House Home Access: Ramped entrance     Home Layout: One level Home Equipment: Emergency planning/management officerhower seat;Walker - 2 wheels Additional Comments: daughter and her family going on vacation on Wednesday and neighbor to stay with pt    Prior Function Level of Independence: Independent with assistive device(s)               Hand Dominance   Dominant Hand: Right    Extremity/Trunk Assessment               Lower Extremity Assessment: RLE deficits/detail RLE Deficits / Details: AAROM knee extension about neutral with ace wraps, flexion AAORM about 90, ankle and hip WFL       Communication   Communication: No difficulties  Cognition Arousal/Alertness: Awake/alert Behavior During Therapy: WFL for tasks assessed/performed Overall Cognitive Status: Within Functional Limits for tasks assessed                      General Comments      Exercises Total Joint Exercises Ankle Circles/Pumps: AROM;10 reps;Seated Long Arc Quad: AROM;Right;5 reps;Seated Knee Flexion: AROM;AAROM;Right;5 reps;Seated      Assessment/Plan    PT Assessment Patient needs continued PT services  PT Diagnosis Acute pain;Difficulty walking   PT Problem  List Decreased strength;Decreased range of motion;Decreased mobility;Pain;Decreased knowledge of use of DME  PT Treatment Interventions DME instruction;Gait training;Functional mobility training;Patient/family education;Therapeutic activities;Therapeutic exercise   PT Goals (Current goals can be found in the Care Plan section) Acute Rehab PT Goals Patient Stated Goal: To go home tomorrow PT Goal Formulation: With patient Time For Goal Achievement: 05/14/16 Potential to Achieve Goals: Good    Frequency 7X/week   Barriers to discharge        Co-evaluation                End of Session Equipment Utilized During Treatment: Gait belt Activity Tolerance: Patient tolerated treatment well Patient left: in chair;with call bell/phone within reach      Functional Assessment Tool Used: Clinical Judgement Functional Limitation: Mobility: Walking and moving around Mobility: Walking and Moving Around Current Status (Z6109): At least 20 percent but less than 40 percent impaired, limited or restricted Mobility: Walking and Moving Around Goal Status [phone]): At least 1 percent but less than 20 percent impaired, limited or restricted    Time: 0981-1914 PT Time Calculation (min) (ACUTE ONLY): 25 min   Charges:   PT Evaluation $PT Eval Moderate Complexity: 1 Procedure PT Treatments $Gait Training: 8-22 mins   PT G Codes:   PT G-Codes **NOT FOR INPATIENT CLASS** Functional Assessment Tool Used: Clinical Judgement Functional Limitation: Mobility: Walking and moving around Mobility: Walking and Moving Around Current Status (N8295): At least 20 percent but less than 40 percent impaired, limited or restricted Mobility: Walking and Moving Around Goal Status [phone]): At least 1 percent but less than 20 percent impaired, limited or restricted    Elray Mcgregor 05/11/2016, 4:30 PM  Sheran Lawless, [place] [phone] 05/11/2016

## 2016-05-11 NOTE — Transfer of Care (Signed)
Immediate Anesthesia Transfer of Care Note  Patient: Tiffany Navarro  Procedure(s) Performed: Procedure(s): TOTAL KNEE ARTHROPLASTY (Right)  Patient Location: PACU  Anesthesia Type:Spinal  Level of Consciousness: awake and alert   Airway & Oxygen Therapy: Patient Spontanous Breathing and Patient connected to face mask oxygen  Post-op Assessment: Report given to RN and Post -op Vital signs reviewed and stable  Post vital signs: Reviewed and stable  Last Vitals:  Filed Vitals:   05/11/16 0643  BP: 130/71  Pulse: 76  Temp: 36.9 C  Resp: 20    Last Pain: There were no vitals filed for this visit.       Complications: No apparent anesthesia complications

## 2016-05-11 NOTE — Op Note (Signed)
TOTAL KNEE REPLACEMENT OPERATIVE NOTE:  05/11/2016  4:34 PM  PATIENT:  Tiffany FleetingLaurie E Carrara  60 y.o. female  PRE-OPERATIVE DIAGNOSIS:  primary osteoarthritis right knee  POST-OPERATIVE DIAGNOSIS:  primary osteoarthritis right knee  PROCEDURE:  Procedure(s): TOTAL KNEE ARTHROPLASTY  SURGEON:  Surgeon(s): Dannielle HuhSteve Noraa Pickeral, MD  PHYSICIAN ASSISTANT: Skip MayerBlair Roberts, PAC}  ANESTHESIA:   spinal  DRAINS: Hemovac  SPECIMEN: None  COUNTS:  Correct  TOURNIQUET:   Total Tourniquet Time Documented: Thigh (laterality) - 50 minutes Total: Thigh (laterality) - 50 minutes   DICTATION:  Indication for procedure:    The patient is a 60 y.o. female who has failed conservative treatment for primary osteoarthritis right knee.  Informed consent was obtained prior to anesthesia. The risks versus benefits of the operation were explain and in a way the patient can, and did, understand.   On the implant demand matching protocol, this patient scored 10.  Therefore, this patient did" "did not receive a polyethylene insert with vitamin E which is a high demand implant.  Description of procedure:     The patient was taken to the operating room and placed under anesthesia.  The patient was positioned in the usual fashion taking care that all body parts were adequately padded and/or protected.  I foley catheter was not placed.  A tourniquet was applied and the leg prepped and draped in the usual sterile fashion.  The extremity was exsanguinated with the esmarch and tourniquet inflated to 350 mmHg.  Pre-operative range of motion was normal.  The knee was in 5 degree of mild valgus.  A midline incision approximately 6-7 inches long was made with a #10 blade.  A new blade was used to make a parapatellar arthrotomy going 2-3 cm into the quadriceps tendon, over the patella, and alongside the medial aspect of the patellar tendon.  A synovectomy was then performed with the #10 blade and forceps. I then elevated the deep  MCL off the medial tibial metaphysis subperiosteally around to the semimembranosus attachment.    I everted the patella and used calipers to measure patellar thickness.  I used the reamer to ream down to appropriate thickness to recreate the native thickness.  I then removed excess bone with the rongeur and sagittal saw.  I used the appropriately sized template and drilled the three lug holes.  I then put the trial in place and measured the thickness with the calipers to ensure recreation of the native thickness.  The trial was then removed and the patella subluxed and the knee brought into flexion.  A homan retractor was place to retract and protect the patella and lateral structures.  A Z-retractor was place medially to protect the medial structures.  The extra-medullary alignment system was used to make cut the tibial articular surface perpendicular to the anamotic axis of the tibia and in 3 degrees of posterior slope.  The cut surface and alignment jig was removed.  I then used the intramedullary alignment guide to make a 4 valgus cut on the distal femur.  I then marked out the epicondylar axis on the distal femur.  The posterior condylar axis measured 5 degrees.  I then used the anterior referencing sizer and measured the femur to be a size 7.  The 4-In-1 cutting block was screwed into place in external rotation matching the posterior condylar angle, making our cuts perpendicular to the epicondylar axis.  Anterior, posterior and chamfer cuts were made with the sagittal saw.  The cutting block and cut pieces  were removed.  A lamina spreader was placed in 90 degrees of flexion.  The ACL, PCL, menisci, and posterior condylar osteophytes were removed.  A 14 mm spacer blocked was found to offer good flexion and extension gap balance after moderate in degree releasing.   The scoop retractor was then placed and the femoral finishing block was pinned in place.  The small sagittal saw was used as well as the lug  drill to finish the femur.  The block and cut surfaces were removed and the medullary canal hole filled with autograft bone from the cut pieces.  The tibia was delivered forward in deep flexion and external rotation.  A size D tray was selected and pinned into place centered on the medial 1/3 of the tibial tubercle.  The reamer and keel was used to prepare the tibia through the tray.    I then trialed with the size 7 femur, size D tibia, a 14 mm insert and the 32 patella.  I had excellent flexion/extension gap balance, excellent patella tracking.  Flexion was full and beyond 120 degrees; extension was zero.  These components were chosen and the staff opened them to me on the back table while the knee was lavaged copiously and the cement mixed.  The soft tissue was infiltrated with 60cc of exparel 1.3% through a 21 gauge needle.  I cemented in the components and removed all excess cement.  The polyethylene tibial component was snapped into place and the knee placed in extension while cement was hardening.  The capsule was infilltrated with 30cc of .25% Marcaine with epinephrine.  A hemovac was place in the joint exiting superolaterally.  A pain pump was place superomedially superficial to the arthrotomy.  Once the cement was hard, the tourniquet was let down.  Hemostasis was obtained.  The arthrotomy was closed with figure-8 #1 vicryl sutures.  The deep soft tissues were closed with #0 vicryls and the subcuticular layer closed with a running #2-0 vicryl.  The skin was reapproximated and closed with skin staples.  The wound was dressed with xeroform, 4 x4's, 2 ABD sponges, a single layer of webril and a TED stocking.   The patient was then awakened, extubated, and taken to the recovery room in stable condition.  BLOOD LOSS:  300cc DRAINS: 1 hemovac, 1 pain catheter COMPLICATIONS:  None.  PLAN OF CARE: Admit to inpatient   PATIENT DISPOSITION:  PACU - hemodynamically stable.   Delay start of  Pharmacological VTE agent (>24hrs) due to surgical blood loss or risk of bleeding:  not applicable  Please fax a copy of this op note to my office at [phone] (please only include page 1 and 2 of the Case Information op note)

## 2016-05-11 NOTE — Anesthesia Procedure Notes (Signed)
Spinal Patient location during procedure: OR Start time: 05/11/2016 8:47 AM End time: 05/11/2016 8:50 AM Staffing Anesthesiologist: Leilani AbleHATCHETT, Edward Guthmiller Performed by: anesthesiologist  Preanesthetic Checklist Completed: patient identified, surgical consent, pre-op evaluation, timeout performed, IV checked, risks and benefits discussed and monitors and equipment checked Spinal Block Patient position: sitting Prep: site prepped and draped and DuraPrep Patient monitoring: heart rate, cardiac monitor, continuous pulse ox and blood pressure Approach: midline Location: L3-4 Injection technique: single-shot Needle Needle type: Sprotte  Needle gauge: 24 G Needle length: 9 cm Needle insertion depth: 6 cm Assessment Sensory level: T8

## 2016-05-11 NOTE — Progress Notes (Signed)
Orthopedic Tech Progress Note Patient Details:  Tiffany FleetingLaurie E Navarro 05/01/1956 [id number]  CPM Right Knee CPM Right Knee: On Right Knee Flexion (Degrees): 90 Right Knee Extension (Degrees): 0 Additional Comments: Trapeze bar and foot roll   Saul FordyceJennifer C Dru Primeau 05/11/2016, 11:28 AM

## 2016-05-12 ENCOUNTER — Encounter (HOSPITAL_COMMUNITY): Payer: Self-pay | Admitting: Orthopedic Surgery

## 2016-05-12 LAB — BASIC METABOLIC PANEL
ANION GAP: 7 (ref 5–15)
BUN: 9 mg/dL (ref 6–20)
CHLORIDE: 104 mmol/L (ref 101–111)
CO2: 27 mmol/L (ref 22–32)
Calcium: 8.8 mg/dL — ABNORMAL LOW (ref 8.9–10.3)
Creatinine, Ser: 0.5 mg/dL (ref 0.44–1.00)
GFR calc Af Amer: >60 mL/min (ref >60)
GFR calc non Af Amer: >60 mL/min (ref >60)
GLUCOSE: 128 mg/dL — AB (ref 65–99)
POTASSIUM: 3.9 mmol/L (ref 3.5–5.1)
Sodium: 138 mmol/L (ref 135–145)

## 2016-05-12 LAB — CBC
HEMATOCRIT: 35.3 % — AB (ref 36.0–46.0)
Hemoglobin: 11.1 g/dL — ABNORMAL LOW (ref 12.0–15.0)
MCH: 28.5 pg (ref 26.0–34.0)
MCHC: 31.4 g/dL (ref 30.0–36.0)
MCV: 90.7 fL (ref 78.0–100.0)
Platelets: 307 10*3/uL (ref 150–400)
RBC: 3.89 MIL/uL (ref 3.87–5.11)
RDW: 13.4 % (ref 11.5–15.5)
WBC: 15.1 10*3/uL — AB (ref 4.0–10.5)

## 2016-05-12 MED ORDER — HYDROCODONE-ACETAMINOPHEN 10-325 MG PO TABS: 1.0000 | ORAL_TABLET | Freq: Four times a day (QID) | ORAL | Status: DC | PRN

## 2016-05-12 MED ORDER — METHOCARBAMOL 500 MG PO TABS: 500.0000 mg | ORAL_TABLET | Freq: Four times a day (QID) | ORAL | Status: DC | PRN

## 2016-05-12 MED ORDER — CELECOXIB 200 MG PO CAPS: 200.0000 mg | ORAL_CAPSULE | Freq: Two times a day (BID) | ORAL | Status: DC

## 2016-05-12 MED ORDER — ENOXAPARIN SODIUM 40 MG/0.4ML ~~LOC~~ SOLN: 40.0000 mg | SUBCUTANEOUS | Status: DC

## 2016-05-12 NOTE — Progress Notes (Signed)
SPORTS MEDICINE AND JOINT REPLACEMENT  Georgena SpurlingStephen Lucey, MD   [facility] CenterColby Robbins PA-C [address], [place], [place]  [id number]                             [phone]   PROGRESS NOTE  Subjective:  negative for Chest Pain  negative for Shortness of Breath  negative for Nausea/Vomiting   negative for Calf Pain  negative for Bowel Movement   Tolerating Diet: yes         Patient reports pain as 5 on 0-10 scale.    Objective: Vital signs in last 24 hours:   Patient Vitals for the past 24 hrs:  BP Temp Temp src Pulse Resp SpO2  05/12/16 0526 122/62 mmHg 98 F (36.7 C) Oral 74 16 95 %  05/12/16 0017 (!) 120/59 mmHg 98.2 F (36.8 C) Oral 75 16 95 %  05/11/16 2105 109/64 mmHg 97.8 F (36.6 C) Oral 71 16 99 %  05/11/16 1410 116/70 mmHg 98.4 F (36.9 C) Oral 66 16 99 %  05/11/16 1300 96/62 mmHg 98 F (36.7 C) - 64 13 93 %  05/11/16 1245 94/60 mmHg - - (!) 58 11 96 %  05/11/16 1230 101/62 mmHg - - (!) 57 12 97 %  05/11/16 1215 98/66 mmHg - - (!) 57 12 96 %  05/11/16 1200 99/62 mmHg - - 65 12 99 %  05/11/16 1145 (!) 106/56 mmHg - - 69 16 100 %  05/11/16 1130 100/84 mmHg - - 60 13 100 %  05/11/16 1115 (!) 94/57 mmHg - - 65 11 100 %  05/11/16 1050 99/67 mmHg 97.5 F (36.4 C) - 87 19 100 %    @flow {1959:LAST@   Intake/Output from previous day:   06/12 0701 - 06/13 0700 In: 4135 [I.V.:3925] Out: 450 [Urine:400]   Intake/Output this shift:       Intake/Output      06/12 0701 - 06/13 0700 06/13 0701 - 06/14 0700   I.V. (mL/kg) 3925 (51.8)    IV Piggyback 210    Total Intake(mL/kg) 4135 (54.6)    Urine (mL/kg/hr) 400 (0.2)    Blood 50 (0)    Total Output 450     Net +3685          Urine Occurrence 4 x       LABORATORY DATA:  Recent Labs  05/11/16 1451 05/12/16 0515  WBC 15.1* 15.1*  HGB 12.7 11.1*  HCT 39.9 35.3*  PLT 346 307    Recent Labs  05/11/16 1451 05/12/16 0515  NA  --  138  K  --  3.9  CL  --  104  CO2  --  27  BUN  --  9  CREATININE 0.62  0.50  GLUCOSE  --  128*  CALCIUM  --  8.8*   Lab Results  Component Value Date   INR 1.05 05/01/2016   INR 1.08 02/06/2016    Examination:  General appearance: alert, cooperative and no distress Extremities: extremities normal, atraumatic, no cyanosis or edema  Wound Exam: clean, dry, intact   Drainage:  None: wound tissue dry  Motor Exam: Quadriceps and Hamstrings Intact  Sensory Exam: Superficial Peroneal, Deep Peroneal and Tibial normal   Assessment:    1 Day Post-Op  Procedure(s) (LRB): TOTAL KNEE ARTHROPLASTY (Right)  ADDITIONAL DIAGNOSIS:  Active Problems:   S/P total knee replacement  Acute Blood Loss Anemia   Plan:  Physical Therapy as ordered Weight Bearing as Tolerated (WBAT)  DVT Prophylaxis:  Lovenox  DISCHARGE PLAN: Home  DISCHARGE NEEDS: HHPT   Patient looks great, will D/C today         Guy Sandifer 05/12/2016, 7:04 AM

## 2016-05-12 NOTE — Progress Notes (Signed)
Orthopedic Tech Progress Note Patient Details:  Tiffany FleetingLaurie E Govoni 03/24/1956 [id number]  CPM Right Knee CPM Right Knee: On Right Knee Flexion (Degrees): 60 Right Knee Extension (Degrees): 0 Additional Comments: Trapeze bar and foot roll   Saul FordyceJennifer C Mosiah Bastin 05/12/2016, 2:30 PM

## 2016-05-12 NOTE — Progress Notes (Signed)
Orthopedic Tech Progress Note Patient Details:  Tiffany Navarro Yebra 10/29/1956 [id number]  Patient ID: Tiffany Navarro Grauberger, female   DOB: 01/02/1956, 60 y.o.   MRN: [id number] Applied cpm 0-55  [name], Siena Poehler J 05/12/2016, 6:07 AM

## 2016-05-12 NOTE — Discharge Summary (Signed)
SPORTS MEDICINE & JOINT REPLACEMENT   Tiffany SpurlingStephen Lucey, MD    Tiffany Nancyolby Robbins, PA-C [address], [place], [place]  [id number]                             [phone]  PATIENT ID: Tiffany FleetingLaurie E Navarro        MRN:  [id number]          DOB/AGE: 60/06/1956 / 60 y.o.    DISCHARGE SUMMARY  ADMISSION DATE:    05/11/2016 DISCHARGE DATE:   05/12/2016   ADMISSION DIAGNOSIS: primary osteoarthritis right knee    DISCHARGE DIAGNOSIS:  primary osteoarthritis right knee    ADDITIONAL DIAGNOSIS: Active Problems:   S/P total knee replacement  Past Medical History  Diagnosis Date  . Thyroid disease   . Hypertension   . Osteoarthritis   . Hypothyroidism     PROCEDURE: Procedure(s): TOTAL KNEE ARTHROPLASTY on 05/11/2016  CONSULTS:     HISTORY:  See H&P in chart  HOSPITAL COURSE:  Tiffany Navarro is a 60 y.o. admitted on 05/11/2016 and found to have a diagnosis of primary osteoarthritis right knee.  After appropriate laboratory studies were obtained  they were taken to the operating room on 05/11/2016 and underwent Procedure(s): TOTAL KNEE ARTHROPLASTY.   They were given perioperative antibiotics:  Anti-infectives    Start     Dose/Rate Route Frequency Ordered Stop   05/11/16 1500  ceFAZolin (ANCEF) IVPB 2g/100 mL premix     2 g 200 mL/hr over 30 Minutes Intravenous Every 6 hours 05/11/16 1401 05/11/16 2108   05/11/16 0800  ceFAZolin (ANCEF) IVPB 2g/100 mL premix     2 g 200 mL/hr over 30 Minutes Intravenous To ShortStay Surgical 05/08/16 2233 05/11/16 0842    .  Patient given tranexamic acid IV or topical and exparel intra-operatively.  Tolerated the procedure well.    POD# 1: Vital signs were stable.  Patient denied Chest pain, shortness of breath, or calf pain.  Patient was started on Lovenox 30 mg subcutaneously twice daily at 8am.  Consults to PT, OT, and care management were made.  The patient was weight bearing as tolerated.  CPM was placed on the operative leg 0-90 degrees for  6-8 hours a day. When out of the CPM, patient was placed in the foam block to achieve full extension. Incentive spirometry was taught.  Dressing was changed.       POD #2, Continued  PT for ambulation and exercise program.  IV saline locked.  O2 discontinued.    The remainder of the hospital course was dedicated to ambulation and strengthening.   The patient was discharged on 1 Day Post-Op in  Good condition.  Blood products given:none  DIAGNOSTIC STUDIES: Recent vital signs: Patient Vitals for the past 24 hrs:  BP Temp Temp src Pulse Resp SpO2  05/12/16 1256 136/70 mmHg 98.1 F (36.7 C) Oral 70 16 100 %  05/12/16 0526 122/62 mmHg 98 F (36.7 C) Oral 74 16 95 %  05/12/16 0017 (!) 120/59 mmHg 98.2 F (36.8 C) Oral 75 16 95 %  05/11/16 2105 109/64 mmHg 97.8 F (36.6 C) Oral 71 16 99 %       Recent laboratory studies:  Recent Labs  05/11/16 1451 05/12/16 0515  WBC 15.1* 15.1*  HGB 12.7 11.1*  HCT 39.9 35.3*  PLT 346 307    Recent Labs  05/11/16 1451 05/12/16 0515  NA  --  138  K  --  3.9  CL  --  104  CO2  --  27  BUN  --  9  CREATININE 0.62 0.50  GLUCOSE  --  128*  CALCIUM  --  8.8*   Lab Results  Component Value Date   INR 1.05 05/01/2016   INR 1.08 02/06/2016     Recent Radiographic Studies :  No results found.  DISCHARGE INSTRUCTIONS: Discharge Instructions    CPM    Complete by:  As directed   Continuous passive motion machine (CPM):      Use the CPM from 0 to 90 for 4-6 hours per day.      You may increase by 10 per day.  You may break it up into 2 or 3 sessions per day.      Use CPM for 2 weeks or until you are told to stop.     Call MD / Call 911    Complete by:  As directed   If you experience chest pain or shortness of breath, CALL 911 and be transported to the hospital emergency room.  If you develope a fever above 101 F, pus (white drainage) or increased drainage or redness at the wound, or calf pain, call your surgeon's office.      Constipation Prevention    Complete by:  As directed   Drink plenty of fluids.  Prune juice may be helpful.  You may use a stool softener, such as Colace (over the counter) 100 mg twice a day.  Use MiraLax (over the counter) for constipation as needed.     Diet - low sodium heart healthy    Complete by:  As directed      Discharge instructions    Complete by:  As directed   INSTRUCTIONS AFTER JOINT REPLACEMENT   Remove items at home which could result in a fall. This includes throw rugs or furniture in walking pathways ICE to the affected joint every three hours while awake for 30 minutes at a time, for at least the first 3-5 days, and then as needed for pain and swelling.  Continue to use ice for pain and swelling. You may notice swelling that will progress down to the foot and ankle.  This is normal after surgery.  Elevate your leg when you are not up walking on it.   Continue to use the breathing machine you got in the hospital (incentive spirometer) which will help keep your temperature down.  It is common for your temperature to cycle up and down following surgery, especially at night when you are not up moving around and exerting yourself.  The breathing machine keeps your lungs expanded and your temperature down.   DIET:  As you were doing prior to hospitalization, we recommend a well-balanced diet.  DRESSING / WOUND CARE / SHOWERING  You may change your dressing 3-5 days after surgery.  Then change the dressing every day with sterile gauze.  Please use good hand washing techniques before changing the dressing.  Do not use any lotions or creams on the incision until instructed by your surgeon.  ACTIVITY  Increase activity slowly as tolerated, but follow the weight bearing instructions below.   No driving for 6 weeks or until further direction given by your physician.  You cannot drive while taking narcotics.  No lifting or carrying greater than 10 lbs. until further directed by your  surgeon. Avoid periods of inactivity such as sitting longer than an hour  when not asleep. This helps prevent blood clots.  You may return to work once you are authorized by your doctor.     WEIGHT BEARING   Weight bearing as tolerated with assist device (walker, cane, etc) as directed, use it as long as suggested by your surgeon or therapist, typically at least 4-6 weeks.   EXERCISES  Results after joint replacement surgery are often greatly improved when you follow the exercise, range of motion and muscle strengthening exercises prescribed by your doctor. Safety measures are also important to protect the joint from further injury. Any time any of these exercises cause you to have increased pain or swelling, decrease what you are doing until you are comfortable again and then slowly increase them. If you have problems or questions, call your caregiver or physical therapist for advice.   Rehabilitation is important following a joint replacement. After just a few days of immobilization, the muscles of the leg can become weakened and shrink (atrophy).  These exercises are designed to build up the tone and strength of the thigh and leg muscles and to improve motion. Often times heat used for twenty to thirty minutes before working out will loosen up your tissues and help with improving the range of motion but do not use heat for the first two weeks following surgery (sometimes heat can increase post-operative swelling).   These exercises can be done on a training (exercise) mat, on the floor, on a table or on a bed. Use whatever works the best and is most comfortable for you.    Use music or television while you are exercising so that the exercises are a pleasant break in your day. This will make your life better with the exercises acting as a break in your routine that you can look forward to.   Perform all exercises about fifteen times, three times per day or as directed.  You should exercise both the  operative leg and the other leg as well.   Exercises include:   Quad Sets - Tighten up the muscle on the front of the thigh (Quad) and hold for 5-10 seconds.   Straight Leg Raises - With your knee straight (if you were given a brace, keep it on), lift the leg to 60 degrees, hold for 3 seconds, and slowly lower the leg.  Perform this exercise against resistance later as your leg gets stronger.  Leg Slides: Lying on your back, slowly slide your foot toward your buttocks, bending your knee up off the floor (only go as far as is comfortable). Then slowly slide your foot back down until your leg is flat on the floor again.  Angel Wings: Lying on your back spread your legs to the side as far apart as you can without causing discomfort.  Hamstring Strength:  Lying on your back, push your heel against the floor with your leg straight by tightening up the muscles of your buttocks.  Repeat, but this time bend your knee to a comfortable angle, and push your heel against the floor.  You may put a pillow under the heel to make it more comfortable if necessary.   A rehabilitation program following joint replacement surgery can speed recovery and prevent re-injury in the future due to weakened muscles. Contact your doctor or a physical therapist for more information on knee rehabilitation.    CONSTIPATION  Constipation is defined medically as fewer than three stools per week and severe constipation as less than one stool per week.  Even if you have a regular bowel pattern at home, your normal regimen is likely to be disrupted due to multiple reasons following surgery.  Combination of anesthesia, postoperative narcotics, change in appetite and fluid intake all can affect your bowels.   YOU MUST use at least one of the following options; they are listed in order of increasing strength to get the job done.  They are all available over the counter, and you may need to use some, POSSIBLY even all of these options:     Drink plenty of fluids (prune juice may be helpful) and high fiber foods Colace 100 mg by mouth twice a day  Senokot for constipation as directed and as needed Dulcolax (bisacodyl), take with full glass of water  Miralax (polyethylene glycol) once or twice a day as needed.  If you have tried all these things and are unable to have a bowel movement in the first 3-4 days after surgery call either your surgeon or your primary doctor.    If you experience loose stools or diarrhea, hold the medications until you stool forms back up.  If your symptoms do not get better within 1 week or if they get worse, check with your doctor.  If you experience "the worst abdominal pain ever" or develop nausea or vomiting, please contact the office immediately for further recommendations for treatment.   ITCHING:  If you experience itching with your medications, try taking only a single pain pill, or even half a pain pill at a time.  You can also use Benadryl over the counter for itching or also to help with sleep.   TED HOSE STOCKINGS:  Use stockings on both legs until for at least 2 weeks or as directed by physician office. They may be removed at night for sleeping.  MEDICATIONS:  See your medication summary on the "After Visit Summary" that nursing will review with you.  You may have some home medications which will be placed on hold until you complete the course of blood thinner medication.  It is important for you to complete the blood thinner medication as prescribed.  PRECAUTIONS:  If you experience chest pain or shortness of breath - call 911 immediately for transfer to the hospital emergency department.   If you develop a fever greater that 101 F, purulent drainage from wound, increased redness or drainage from wound, foul odor from the wound/dressing, or calf pain - CONTACT YOUR SURGEON.                                                   FOLLOW-UP APPOINTMENTS:  If you do not already have a post-op  appointment, please call the office for an appointment to be seen by your surgeon.  Guidelines for how soon to be seen are listed in your "After Visit Summary", but are typically between 1-4 weeks after surgery.  OTHER INSTRUCTIONS:   Knee Replacement:  Do not place pillow under knee, focus on keeping the knee straight while resting. CPM instructions: 0-90 degrees, 2 hours in the morning, 2 hours in the afternoon, and 2 hours in the evening. Place foam block, curve side up under heel at all times except when in CPM or when walking.  DO NOT modify, tear, cut, or change the foam block in any way.  MAKE SURE YOU:  Understand these instructions.  Get help right away if you are not doing well or get worse.    Thank you for letting us be a part of your medical care team.  It is a privilege we respect greatly.  We hope these instructions will help you stay on track for a fast and full recovery!     Driving restrictions    Complete by:  As directed   No driving for 4-6 weeks     Increase activity slowly as tolerated    Complete by:  As directed            DISCHARGE MEDICATIONS:     Medication List    STOP taking these medications        meloxicam 15 MG tablet  Commonly known as:  MOBIC      TAKE these medications        celecoxib 200 MG capsule  Commonly known as:  CELEBREX  Take 1 capsule (200 mg total) by mouth 2 (two) times daily.     enoxaparin 40 MG/0.4ML injection  Commonly known as:  LOVENOX  Inject 0.4 mLs (40 mg total) into the skin daily.     hydrochlorothiazide 25 MG tablet  Commonly known as:  HYDRODIURIL  Take 25 mg by mouth daily.     HYDROcodone-acetaminophen 10-325 MG tablet  Commonly known as:  NORCO  Take 1 tablet by mouth every 6 (six) hours as needed.     levothyroxine 125 MCG tablet  Commonly known as:  SYNTHROID, LEVOTHROID  Take 125 mcg by mouth daily before breakfast.     Magnesium 400 MG Caps  Take 400 mg by mouth daily.     methocarbamol 500  MG tablet  Commonly known as:  ROBAXIN  Take 1-2 tablets (500-1,000 mg total) by mouth every 6 (six) hours as needed for muscle spasms.     VITAMIN D PO  Take 5,000 Units by mouth daily.        FOLLOW UP VISIT:       Follow-up Information    Follow up with [facility].   Why:  Someone from [facility] will contact you to arrange start date and time for therapy.   Contact information:   [address] [place] [address] [phone]       Follow up with Raymon Mutton, MD. Call on 05/26/2016.   Specialty:  Orthopedic Surgery   Contact information:   [address] [place] [address] [phone]       DISPOSITION: HOME VS. SNF  CONDITION:  Good   Guy Sandifer 05/12/2016, 5:19 PM

## 2016-05-12 NOTE — Evaluation (Signed)
Occupational Therapy Evaluation Patient Details Name: Tiffany FleetingLaurie E Navarro MRN: [id number] DOB: 05/24/1956 Today's Date: 05/12/2016    History of Present Illness Patient is a 60 y/o female with hx of HTN, thyroid disease, recent L TKA and self reported sciatica s/p right TKA.    Clinical Impression   Pt with decline in function and safety with ADLs and ADL mobility with decreased strength, balance and endurance. Pt limited by pain although she had been pre medicated PTA. Pt would benefit from acute OT services to address impairments to increase level of function and safety. Pt will have assst from neighbor and her daughter    Follow Up Recommendations  Home health OT    Equipment Recommendations  Other (comment) (reacher for LB dressing)    Recommendations for Other Services       Precautions / Restrictions Precautions Precautions: Fall Restrictions Weight Bearing Restrictions: Yes RLE Weight Bearing: Weight bearing as tolerated      Mobility Bed Mobility Overal bed mobility: Modified Independent Bed Mobility: Supine to Sit     Supine to sit: Modified independent (Device/Increase time) Sit to supine: Modified independent (Device/Increase time)   General bed mobility comments: increased time needed; HOB flat and no use of bed rail  Transfers Overall transfer level: Needs assistance Equipment used: Rolling walker (2 wheeled) Transfers: Sit to/from Stand Sit to Stand: Min guard         General transfer comment:  cues for hand placement and to use grab bars in bathroom    Balance Overall balance assessment: Needs assistance Sitting-balance support: No upper extremity supported;Feet supported Sitting balance-Leahy Scale: Good     Standing balance support: During functional activity;Bilateral upper extremity supported;Single extremity supported Standing balance-Leahy Scale: Fair                              ADL Overall ADL's : Needs  assistance/impaired     Grooming: Wash/dry hands;Wash/dry face;Standing;Min guard   Upper Body Bathing: Set up;Sitting   Lower Body Bathing: Moderate assistance   Upper Body Dressing : Set up;Sitting   Lower Body Dressing: Maximal assistance   Toilet Transfer: RW;Comfort height toilet;Grab bars;Min guard;Cueing for safety Toilet Transfer Details (indicate cue type and reason):  cues for hand placement and to use grab bars in bathroom. Pt insisted on not using 3 in 1 over toilet Toileting- Clothing Manipulation and Hygiene: Minimal assistance;Sit to/from stand   Tub/ Engineer, structuralhower Transfer: 3 in 1;Rolling walker;Grab bars;Cueing for safety;Min guard   Functional mobility during ADLs: Cueing for safety;Min guard General ADL Comments: pt educated on use of 3 in 1over toilet to provide safety and ease of the transition for sit - stnad/stand - sit     Vision  reading glasses, no change from baseline              Pertinent Vitals/Pain Pain Assessment: 0-10 Pain Score: 6  Pain Location: R knee Pain Descriptors / Indicators: Aching;Sore;Grimacing;Guarding;Discomfort Pain Intervention(s): Limited activity within patient's tolerance;Monitored during session;Premedicated before session;Repositioned     Hand Dominance Right   Extremity/Trunk Assessment Upper Extremity Assessment Upper Extremity Assessment: Overall WFL for tasks assessed   Lower Extremity Assessment Lower Extremity Assessment: Defer to PT evaluation   Cervical / Trunk Assessment Cervical / Trunk Assessment: Normal   Communication     Cognition Arousal/Alertness: Awake/alert Behavior During Therapy: WFL for tasks assessed/performed Overall Cognitive Status: Within Functional Limits for tasks assessed  General Comments   Pt required encouragement for maintaining safety tecniques during mobility                Home Living Family/patient expects to be discharged to:: Private  residence Living Arrangements: Children;Non-relatives/Friends Available Help at Discharge: Family;Available 24 hours/day Type of Home: House Home Access: Ramped entrance     Home Layout: One level     Bathroom Shower/Tub: Producer, television/film/video: Handicapped height     Home Equipment: Emergency planning/management officer - 2 wheels   Additional Comments: daughter and her family going on vacation on Wednesday and neighbor to stay with pt      Prior Functioning/Environment Level of Independence: Independent with assistive device(s)        Comments: Pt using RW PTA due to sciatica for the last 2-3 weeks and knee pain. 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    OT Diagnosis: Acute pain   OT Problem List: Pain;Decreased safety awareness;Impaired balance (sitting and/or standing);Decreased activity tolerance;Decreased knowledge of use of DME or AE   OT Treatment/Interventions: Self-care/ADL training;Patient/family education;Therapeutic activities;DME and/or AE instruction    OT Goals(Current goals can be found in the care plan section) Acute Rehab OT Goals Patient Stated Goal: To go home tomorrow OT Goal Formulation: With patient/family Time For Goal Achievement: 05/19/16 Potential to Achieve Goals: Good ADL Goals Pt Will Perform Grooming: with set-up;with supervision;standing Pt Will Perform Lower Body Bathing: with min assist;sit to/from stand;sitting/lateral leans Pt Will Perform Lower Body Dressing: with mod assist;with min assist;sitting/lateral leans;sit to/from stand;with adaptive equipment Pt Will Transfer to Toilet: with supervision;grab bars (3 in 1 over toilet) Pt Will Perform Toileting - Clothing Manipulation and hygiene: with min guard assist;sit to/from stand Pt Will Perform Tub/Shower Transfer: with supervision;shower seat  OT Frequency: Min 2X/week   Barriers to D/C:    none                      End of Session Equipment Utilized During Treatment: Gait belt;Rolling walker;Other (comment) (3 in 1 ) CPM Right Knee CPM Right Knee: Off  Activity Tolerance: Patient limited by pain Patient left: in chair;with call bell/phone within reach;with family/visitor present   Time: 1610-9604 OT Time Calculation (min): 28 min Charges:  OT General Charges $OT Visit: 1 Procedure OT Evaluation $OT Eval Moderate Complexity: 1 Procedure OT Treatments $Therapeutic Activity: 8-22 mins G-Codes:    Galen Manila 05/12/2016, 11:21 AM

## 2016-05-12 NOTE — Care Management Note (Signed)
Case Management Note  Patient Details  Name: Preston FleetingLaurie E Petros MRN: [id number] Date of Birth: 10/09/1956  Subjective/Objective:    60 yr old female s/p right total knee arthroplasty.                Action/Plan: Case manager spoke with patient concerning Home Health and DME needs at discharge. Choice was offered for home Health agencies. [facility], no changes. Patient states she has rolling walker, 3in1 from previous surgery. Will have family support at discharge.   Expected Discharge Date:   05/13/16               Expected Discharge Plan:   home with Home Health  In-House Referral:     Discharge planning Services  CM Consult  Post Acute Care Choice:    Choice offered to:  Patient  DME Arranged:  CPM DME Agency:  [employer]  HH Arranged:  PT HH [facility]  [facility]  Status of Service:  Completed, signed off  Medicare Important Message Given:    Date Medicare IM Given:    Medicare IM give by:    Date Additional Medicare IM Given:    Additional Medicare Important Message give by:     If discussed at Long Length of Stay Meetings, dates discussed:    Additional Comments:  Durenda GuthrieBrady, Devantae Babe Naomi, RN 05/12/2016, 3:08 PM

## 2016-05-12 NOTE — Progress Notes (Signed)
Physical Therapy Treatment Patient Details Name: Tiffany Navarro MRN: [id number] DOB: 02/13/1956 Today's Date: 05/12/2016    History of Present Illness Patient is a 60 y/o female with hx of HTN, thyroid disease, recent L TKA and self reported sciatica s/p right TKA.     PT Comments    Patient reported increased pain today. Pt is making gradual progress toward PT goals. Continue to progress as tolerated with anticipated d/c home with HHPT.   Follow Up Recommendations  Home health PT     Equipment Recommendations  None recommended by PT    Recommendations for Other Services       Precautions / Restrictions Precautions Precautions: Fall Restrictions Weight Bearing Restrictions: Yes RLE Weight Bearing: Weight bearing as tolerated    Mobility  Bed Mobility Overal bed mobility: Modified Independent Bed Mobility: Supine to Sit;Sit to Supine     Supine to sit: Modified independent (Device/Increase time) Sit to supine: Modified independent (Device/Increase time)   General bed mobility comments: increased time needed; HOB flat and no use of bed rail  Transfers Overall transfer level: Needs assistance Equipment used: Rolling walker (2 wheeled) Transfers: Sit to/from Stand Sit to Stand: Min guard         General transfer comment: from EOB and commode with use of grab bars; cues for hand placement  Ambulation/Gait Ambulation/Gait assistance: Supervision Ambulation Distance (Feet): 150 Feet Assistive device: Rolling walker (2 wheeled) Gait Pattern/deviations: Step-through pattern;Decreased stride length;Decreased stance time - right;Decreased weight shift to right;Antalgic;Trunk flexed Gait velocity: decreased   General Gait Details: cues for position of RW, engaging R quad, and encouraged to increase WB on R LE; pt maintained slightly flexed knee throughout session   Stairs            Wheelchair Mobility    Modified Rankin (Stroke Patients Only)        Balance Overall balance assessment: No apparent balance deficits (not formally assessed)                                  Cognition Arousal/Alertness: Awake/alert Behavior During Therapy: WFL for tasks assessed/performed Overall Cognitive Status: Within Functional Limits for tasks assessed                      Exercises Total Joint Exercises Quad Sets: AROM;Right;5 reps;Supine Heel Slides: AAROM;Right;5 reps;Supine    General Comments        Pertinent Vitals/Pain Pain Assessment: 0-10 Pain Score: 4  Pain Location: R knee Pain Descriptors / Indicators: Discomfort;Grimacing;Guarding;Sore Pain Intervention(s): Limited activity within patient's tolerance;Monitored during session;Repositioned;Ice applied    Home Living                      Prior Function            PT Goals (current goals can now be found in the care plan section) Acute Rehab PT Goals Patient Stated Goal: To go home tomorrow Progress towards PT goals: Progressing toward goals    Frequency  7X/week    PT Plan Current plan remains appropriate    Co-evaluation             End of Session Equipment Utilized During Treatment: Gait belt Activity Tolerance: Patient limited by pain Patient left: in bed;with call bell/phone within reach;Other (comment) (R LE in zero degree foam)     Time: [id number] PT Time Calculation (min) (ACUTE  ONLY): 33 min  Charges:  $Gait Training: 8-22 mins $Therapeutic Exercise: 8-22 mins                    G Codes:      Derek Mound, PTA Pager: [phone]   05/12/2016, 9:17 AM

## 2016-05-12 NOTE — Progress Notes (Signed)
Physical Therapy Treatment Patient Details Name: Tiffany Navarro MRN: [id number] DOB: July 23, 1956 Today's Date: 05/12/2016    History of Present Illness Patient is a 60 y/o female with hx of HTN, thyroid disease, recent L TKA and self reported sciatica s/p right TKA.     PT Comments    Patient demo'd gradual increase in ROM this session and ability to weight bear on R LE. Continues to be limited by pain. Reviewed HEP and precautions. Current plan remains appropriate.   Follow Up Recommendations  Home health PT     Equipment Recommendations  None recommended by PT    Recommendations for Other Services       Precautions / Restrictions Precautions Precautions: Fall Restrictions Weight Bearing Restrictions: Yes RLE Weight Bearing: Weight bearing as tolerated    Mobility  Bed Mobility Overal bed mobility: Modified Independent Bed Mobility: Supine to Sit     Supine to sit: Modified independent (Device/Increase time)     General bed mobility comments: OOB in chair upon arrival  Transfers Overall transfer level: Needs assistance Equipment used: Rolling walker (2 wheeled) Transfers: Sit to/from Stand Sit to Stand: Min guard         General transfer comment: from recliner and BSC; cues for hand placement and technique  Ambulation/Gait Ambulation/Gait assistance: Supervision Ambulation Distance (Feet): 100 Feet Assistive device: Rolling walker (2 wheeled) Gait Pattern/deviations: Step-through pattern;Decreased stride length;Decreased stance time - right;Decreased weight shift to right;Trunk flexed Gait velocity: decreased   General Gait Details: verbal and tactile cues for R quad activation during stance phase; vc for posutre and safe use fo AD; pt with little improvement in WBing on R LE this session    Stairs            Wheelchair Mobility    Modified Rankin (Stroke Patients Only)       Balance Overall balance assessment: Needs  assistance Sitting-balance support: No upper extremity supported;Feet supported Sitting balance-Leahy Scale: Good     Standing balance support: Bilateral upper extremity supported;During functional activity Standing balance-Leahy Scale: Fair                      Cognition Arousal/Alertness: Awake/alert Behavior During Therapy: WFL for tasks assessed/performed Overall Cognitive Status: Within Functional Limits for tasks assessed                      Exercises Total Joint Exercises Heel Slides: Right;AROM;5 reps;Seated Knee Flexion: AROM;Right;10 reps;Seated Goniometric ROM: 5-80    General Comments        Pertinent Vitals/Pain Pain Assessment: 0-10 Pain Score: 8  Pain Location: R knee and shin Pain Descriptors / Indicators: Aching;Grimacing;Guarding;Sore Pain Intervention(s): Limited activity within patient's tolerance;Monitored during session;Repositioned;Ice applied    Home Living Family/patient expects to be discharged to:: Private residence Living Arrangements: Children;Non-relatives/Friends Available Help at Discharge: Family;Available 24 hours/day Type of Home: House Home Access: Ramped entrance   Home Layout: One level Home Equipment: Emergency planning/management officer - 2 wheels Additional Comments: daughter and her family going on vacation on Wednesday and neighbor to stay with pt    Prior Function Level of Independence: Independent with assistive device(s)      Comments: Pt using RW PTA due to sciatica for the last 2-3 weeks and knee pain. 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   PT Goals (current goals can now be found  in the care plan section) Acute Rehab PT Goals Patient Stated Goal: go home at 6:15 Progress towards PT goals: Progressing toward goals    Frequency  7X/week    PT Plan Current plan remains appropriate    Co-evaluation             End of Session  Equipment Utilized During Treatment: Gait belt Activity Tolerance: Patient limited by pain Patient left: in bed;with call bell/phone within reach;Other (comment) (R LE in zero degree foam)     Time: [id number] PT Time Calculation (min) (ACUTE ONLY): 31 min  Charges:  $Gait Training: 8-22 mins $Therapeutic Exercise: 8-22 mins                    G Codes:      Derek MoundKellyn R Raynah Gomes Nikyla Navedo, PTA Pager: [phone]   05/12/2016, 1:48 PM

## 2016-06-08 ENCOUNTER — Emergency Department (HOSPITAL_COMMUNITY): Payer: BLUE CROSS/BLUE SHIELD

## 2016-06-08 ENCOUNTER — Encounter (HOSPITAL_COMMUNITY): Payer: Self-pay | Admitting: Emergency Medicine

## 2016-06-08 ENCOUNTER — Emergency Department (HOSPITAL_COMMUNITY)
Admission: EM | Admit: 2016-06-08 | Discharge: 2016-06-08 | Disposition: A | Discharge status: Home / Self Care | Payer: BLUE CROSS/BLUE SHIELD | Attending: Emergency Medicine | Admitting: Emergency Medicine

## 2016-06-08 DIAGNOSIS — F17210 Nicotine dependence, cigarettes, uncomplicated: Secondary | ICD-10-CM | POA: Insufficient documentation

## 2016-06-08 DIAGNOSIS — M5431 Sciatica, right side: Secondary | ICD-10-CM | POA: Diagnosis not present

## 2016-06-08 DIAGNOSIS — R0602 Shortness of breath: Secondary | ICD-10-CM

## 2016-06-08 DIAGNOSIS — Z79899 Other long term (current) drug therapy: Secondary | ICD-10-CM | POA: Diagnosis not present

## 2016-06-08 DIAGNOSIS — I10 Essential (primary) hypertension: Secondary | ICD-10-CM | POA: Insufficient documentation

## 2016-06-08 DIAGNOSIS — Z96653 Presence of artificial knee joint, bilateral: Secondary | ICD-10-CM | POA: Diagnosis not present

## 2016-06-08 DIAGNOSIS — R51 Headache: Secondary | ICD-10-CM | POA: Insufficient documentation

## 2016-06-08 DIAGNOSIS — R200 Anesthesia of skin: Secondary | ICD-10-CM | POA: Diagnosis not present

## 2016-06-08 DIAGNOSIS — R519 Headache, unspecified: Secondary | ICD-10-CM

## 2016-06-08 LAB — DIFFERENTIAL
Basophils Absolute: 0 10*3/uL (ref 0.0–0.1)
Basophils Relative: 0 %
EOS PCT: 5 %
Eosinophils Absolute: 0.6 10*3/uL (ref 0.0–0.7)
LYMPHS ABS: 3.5 10*3/uL (ref 0.7–4.0)
LYMPHS PCT: 27 %
MONOS PCT: 6 %
Monocytes Absolute: 0.8 10*3/uL (ref 0.1–1.0)
NEUTROS PCT: 62 %
Neutro Abs: 8 10*3/uL — ABNORMAL HIGH (ref 1.7–7.7)

## 2016-06-08 LAB — COMPREHENSIVE METABOLIC PANEL
ALBUMIN: 3.8 g/dL (ref 3.5–5.0)
ALK PHOS: 66 U/L (ref 38–126)
ALT: 17 U/L (ref 14–54)
ANION GAP: 10 (ref 5–15)
AST: 18 U/L (ref 15–41)
BILIRUBIN TOTAL: 0.7 mg/dL (ref 0.3–1.2)
BUN: 13 mg/dL (ref 6–20)
CO2: 26 mmol/L (ref 22–32)
Calcium: 9.6 mg/dL (ref 8.9–10.3)
Chloride: 101 mmol/L (ref 101–111)
Creatinine, Ser: 0.72 mg/dL (ref 0.44–1.00)
GFR calc Af Amer: >60 mL/min (ref >60)
GLUCOSE: 150 mg/dL — AB (ref 65–99)
Potassium: 3.6 mmol/L (ref 3.5–5.1)
Sodium: 137 mmol/L (ref 135–145)
Total Protein: 7.3 g/dL (ref 6.5–8.1)

## 2016-06-08 LAB — CBC
HEMATOCRIT: 44.2 % (ref 36.0–46.0)
HEMOGLOBIN: 14.6 g/dL (ref 12.0–15.0)
MCH: 30.3 pg (ref 26.0–34.0)
MCHC: 33 g/dL (ref 30.0–36.0)
MCV: 91.7 fL (ref 78.0–100.0)
Platelets: 359 10*3/uL (ref 150–400)
RBC: 4.82 MIL/uL (ref 3.87–5.11)
RDW: 13.8 % (ref 11.5–15.5)
WBC: 12.9 10*3/uL — ABNORMAL HIGH (ref 4.0–10.5)

## 2016-06-08 LAB — APTT: aPTT: 28 seconds (ref 24–37)

## 2016-06-08 LAB — D-DIMER, QUANTITATIVE: D-Dimer, Quant: 2.16 ug/mL-FEU — ABNORMAL HIGH (ref 0.00–0.50)

## 2016-06-08 LAB — I-STAT TROPONIN, ED: Troponin i, poc: 0 ng/mL (ref 0.00–0.08)

## 2016-06-08 LAB — PROTIME-INR
INR: 1.05 (ref 0.00–1.49)
Prothrombin Time: 13.9 seconds (ref 11.6–15.2)

## 2016-06-08 MED ORDER — IOPAMIDOL (ISOVUE-370) INJECTION 76%
INTRAVENOUS | Status: AC
  Administered 2016-06-08: 50 mL
  Filled 2016-06-08: qty 50

## 2016-06-08 MED ORDER — PREDNISONE 20 MG PO TABS: 40.0000 mg | ORAL_TABLET | Freq: Every day | ORAL | Status: DC

## 2016-06-08 MED ORDER — IOPAMIDOL (ISOVUE-370) INJECTION 76%
INTRAVENOUS | Status: AC
  Filled 2016-06-08: qty 100

## 2016-06-08 NOTE — Discharge Instructions (Signed)
Sciatica With Rehab The sciatic nerve runs from the back down the leg and is responsible for sensation and control of the muscles in the back (posterior) side of the thigh, lower leg, and foot. Sciatica is a condition that is characterized by inflammation of this nerve.  SYMPTOMS   Signs of nerve damage, including numbness and/or weakness along the posterior side of the lower extremity.  Pain in the back of the thigh that may also travel down the leg.  Pain that worsens when sitting for long periods of time.  Occasionally, pain in the back or buttock. CAUSES  Inflammation of the sciatic nerve is the cause of sciatica. The inflammation is due to something irritating the nerve. Common sources of irritation include:  Sitting for long periods of time.  Direct trauma to the nerve.  Arthritis of the spine.  Herniated or ruptured disk.  Slipping of the vertebrae (spondylolisthesis).  Pressure from soft tissues, such as muscles or ligament-like tissue (fascia). RISK INCREASES WITH:  Sports that place pressure or stress on the spine (football or weightlifting).  Poor strength and flexibility.  Failure to warm up properly before activity.  Family history of low back pain or disk disorders.  Previous back injury or surgery.  Poor body mechanics, especially when lifting, or poor posture. PREVENTION   Warm up and stretch properly before activity.  Maintain physical fitness:  Strength, flexibility, and endurance.  Cardiovascular fitness.  Learn and use proper technique, especially with posture and lifting. When possible, have coach correct improper technique.  Avoid activities that place stress on the spine. PROGNOSIS If treated properly, then sciatica usually resolves within 6 weeks. However, occasionally surgery is necessary.  RELATED COMPLICATIONS   Permanent nerve damage, including pain, numbness, tingle, or weakness.  Chronic back pain.  Risks of surgery: infection,  bleeding, nerve damage, or damage to surrounding tissues. TREATMENT Treatment initially involves resting from any activities that aggravate your symptoms. The use of ice and medication may help reduce pain and inflammation. The use of strengthening and stretching exercises may help reduce pain with activity. These exercises may be performed at home or with referral to a therapist. A therapist may recommend further treatments, such as transcutaneous electronic nerve stimulation (TENS) or ultrasound. Your caregiver may recommend corticosteroid injections to help reduce inflammation of the sciatic nerve. If symptoms persist despite non-surgical (conservative) treatment, then surgery may be recommended. MEDICATION  If pain medication is necessary, then nonsteroidal anti-inflammatory medications, such as aspirin and ibuprofen, or other minor pain relievers, such as acetaminophen, are often recommended.  Do not take pain medication for 7 days before surgery.  Prescription pain relievers may be given if deemed necessary by your caregiver. Use only as directed and only as much as you need.  Ointments applied to the skin may be helpful.  Corticosteroid injections may be given by your caregiver. These injections should be reserved for the most serious cases, because they may only be given a certain number of times. HEAT AND COLD  Cold treatment (icing) relieves pain and reduces inflammation. Cold treatment should be applied for 10 to 15 minutes every 2 to 3 hours for inflammation and pain and immediately after any activity that aggravates your symptoms. Use ice packs or massage the area with a piece of ice (ice massage).  Heat treatment may be used prior to performing the stretching and strengthening activities prescribed by your caregiver, physical therapist, or athletic trainer. Use a heat pack or soak the injury in warm water.   SEEK MEDICAL CARE IF:  Treatment seems to offer no benefit, or the condition  worsens.  Any medications produce adverse side effects. EXERCISES  RANGE OF MOTION (ROM) AND STRETCHING EXERCISES - Sciatica Most people with sciatic will find that their symptoms worsen with either excessive bending forward (flexion) or arching at the low back (extension). The exercises which will help resolve your symptoms will focus on the opposite motion. Your physician, physical therapist or athletic trainer will help you determine which exercises will be most helpful to resolve your low back pain. Do not complete any exercises without first consulting with your clinician. Discontinue any exercises which worsen your symptoms until you speak to your clinician. If you have pain, numbness or tingling which travels down into your buttocks, leg or foot, the goal of the therapy is for these symptoms to move closer to your back and eventually resolve. Occasionally, these leg symptoms will get better, but your low back pain may worsen; this is typically an indication of progress in your rehabilitation. Be certain to be very alert to any changes in your symptoms and the activities in which you participated in the 24 hours prior to the change. Sharing this information with your clinician will allow him/her to most efficiently treat your condition. These exercises may help you when beginning to rehabilitate your injury. Your symptoms may resolve with or without further involvement from your physician, physical therapist or athletic trainer. While completing these exercises, remember:   Restoring tissue flexibility helps normal motion to return to the joints. This allows healthier, less painful movement and activity.  An effective stretch should be held for at least 30 seconds.  A stretch should never be painful. You should only feel a gentle lengthening or release in the stretched tissue. FLEXION RANGE OF MOTION AND STRETCHING EXERCISES: STRETCH - Flexion, Single Knee to Chest   Lie on a firm bed or floor  with both legs extended in front of you.  Keeping one leg in contact with the floor, bring your opposite knee to your chest. Hold your leg in place by either grabbing behind your thigh or at your knee.  Pull until you feel a gentle stretch in your low back. Hold __________ seconds.  Slowly release your grasp and repeat the exercise with the opposite side. Repeat __________ times. Complete this exercise __________ times per day.  STRETCH - Flexion, Double Knee to Chest  Lie on a firm bed or floor with both legs extended in front of you.  Keeping one leg in contact with the floor, bring your opposite knee to your chest.  Tense your stomach muscles to support your back and then lift your other knee to your chest. Hold your legs in place by either grabbing behind your thighs or at your knees.  Pull both knees toward your chest until you feel a gentle stretch in your low back. Hold __________ seconds.  Tense your stomach muscles and slowly return one leg at a time to the floor. Repeat __________ times. Complete this exercise __________ times per day.  STRETCH - Low Trunk Rotation   Lie on a firm bed or floor. Keeping your legs in front of you, bend your knees so they are both pointed toward the ceiling and your feet are flat on the floor.  Extend your arms out to the side. This will stabilize your upper body by keeping your shoulders in contact with the floor.  Gently and slowly drop both knees together to one side until   you feel a gentle stretch in your low back. Hold for __________ seconds.  Tense your stomach muscles to support your low back as you bring your knees back to the starting position. Repeat the exercise to the other side. Repeat __________ times. Complete this exercise __________ times per day  EXTENSION RANGE OF MOTION AND FLEXIBILITY EXERCISES: STRETCH - Extension, Prone on Elbows  Lie on your stomach on the floor, a bed will be too soft. Place your palms about shoulder  width apart and at the height of your head.  Place your elbows under your shoulders. If this is too painful, stack pillows under your chest.  Allow your body to relax so that your hips drop lower and make contact more completely with the floor.  Hold this position for __________ seconds.  Slowly return to lying flat on the floor. Repeat __________ times. Complete this exercise __________ times per day.  RANGE OF MOTION - Extension, Prone Press Ups  Lie on your stomach on the floor, a bed will be too soft. Place your palms about shoulder width apart and at the height of your head.  Keeping your back as relaxed as possible, slowly straighten your elbows while keeping your hips on the floor. You may adjust the placement of your hands to maximize your comfort. As you gain motion, your hands will come more underneath your shoulders.  Hold this position __________ seconds.  Slowly return to lying flat on the floor. Repeat __________ times. Complete this exercise __________ times per day.  STRENGTHENING EXERCISES - Sciatica  These exercises may help you when beginning to rehabilitate your injury. These exercises should be done near your "sweet spot." This is the neutral, low-back arch, somewhere between fully rounded and fully arched, that is your least painful position. When performed in this safe range of motion, these exercises can be used for people who have either a flexion or extension based injury. These exercises may resolve your symptoms with or without further involvement from your physician, physical therapist or athletic trainer. While completing these exercises, remember:   Muscles can gain both the endurance and the strength needed for everyday activities through controlled exercises.  Complete these exercises as instructed by your physician, physical therapist or athletic trainer. Progress with the resistance and repetition exercises only as your caregiver advises.  You may  experience muscle soreness or fatigue, but the pain or discomfort you are trying to eliminate should never worsen during these exercises. If this pain does worsen, stop and make certain you are following the directions exactly. If the pain is still present after adjustments, discontinue the exercise until you can discuss the trouble with your clinician. STRENGTHENING - Deep Abdominals, Pelvic Tilt   Lie on a firm bed or floor. Keeping your legs in front of you, bend your knees so they are both pointed toward the ceiling and your feet are flat on the floor.  Tense your lower abdominal muscles to press your low back into the floor. This motion will rotate your pelvis so that your tail bone is scooping upwards rather than pointing at your feet or into the floor.  With a gentle tension and even breathing, hold this position for __________ seconds. Repeat __________ times. Complete this exercise __________ times per day.  STRENGTHENING - Abdominals, Crunches   Lie on a firm bed or floor. Keeping your legs in front of you, bend your knees so they are both pointed toward the ceiling and your feet are flat on the   floor. Cross your arms over your chest.  Slightly tip your chin down without bending your neck.  Tense your abdominals and slowly lift your trunk high enough to just clear your shoulder blades. Lifting higher can put excessive stress on the low back and does not further strengthen your abdominal muscles.  Control your return to the starting position. Repeat __________ times. Complete this exercise __________ times per day.  STRENGTHENING - Quadruped, Opposite UE/LE Lift  Assume a hands and knees position on a firm surface. Keep your hands under your shoulders and your knees under your hips. You may place padding under your knees for comfort.  Find your neutral spine and gently tense your abdominal muscles so that you can maintain this position. Your shoulders and hips should form a rectangle  that is parallel with the floor and is not twisted.  Keeping your trunk steady, lift your right hand no higher than your shoulder and then your left leg no higher than your hip. Make sure you are not holding your breath. Hold this position __________ seconds.  Continuing to keep your abdominal muscles tense and your back steady, slowly return to your starting position. Repeat with the opposite arm and leg. Repeat __________ times. Complete this exercise __________ times per day.  STRENGTHENING - Abdominals and Quadriceps, Straight Leg Raise   Lie on a firm bed or floor with both legs extended in front of you.  Keeping one leg in contact with the floor, bend the other knee so that your foot can rest flat on the floor.  Find your neutral spine, and tense your abdominal muscles to maintain your spinal position throughout the exercise.  Slowly lift your straight leg off the floor about 6 inches for a count of 15, making sure to not hold your breath.  Still keeping your neutral spine, slowly lower your leg all the way to the floor. Repeat this exercise with each leg __________ times. Complete this exercise __________ times per day. POSTURE AND BODY MECHANICS CONSIDERATIONS - Sciatica Keeping correct posture when sitting, standing or completing your activities will reduce the stress put on different body tissues, allowing injured tissues a chance to heal and limiting painful experiences. The following are general guidelines for improved posture. Your physician or physical therapist will provide you with any instructions specific to your needs. While reading these guidelines, remember:  The exercises prescribed by your provider will help you have the flexibility and strength to maintain correct postures.  The correct posture provides the optimal environment for your joints to work. All of your joints have less wear and tear when properly supported by a spine with good posture. This means you will  experience a healthier, less painful body.  Correct posture must be practiced with all of your activities, especially prolonged sitting and standing. Correct posture is as important when doing repetitive low-stress activities (typing) as it is when doing a single heavy-load activity (lifting). RESTING POSITIONS Consider which positions are most painful for you when choosing a resting position. If you have pain with flexion-based activities (sitting, bending, stooping, squatting), choose a position that allows you to rest in a less flexed posture. You would want to avoid curling into a fetal position on your side. If your pain worsens with extension-based activities (prolonged standing, working overhead), avoid resting in an extended position such as sleeping on your stomach. Most people will find more comfort when they rest with their spine in a more neutral position, neither too rounded nor too   arched. Lying on a non-sagging bed on your side with a pillow between your knees, or on your back with a pillow under your knees will often provide some relief. Keep in mind, being in any one position for a prolonged period of time, no matter how correct your posture, can still lead to stiffness. PROPER SITTING POSTURE In order to minimize stress and discomfort on your spine, you must sit with correct posture Sitting with good posture should be effortless for a healthy body. Returning to good posture is a gradual process. Many people can work toward this most comfortably by using various supports until they have the flexibility and strength to maintain this posture on their own. When sitting with proper posture, your ears will fall over your shoulders and your shoulders will fall over your hips. You should use the back of the chair to support your upper back. Your low back will be in a neutral position, just slightly arched. You may place a small pillow or folded towel at the base of your low back for support.  When  working at a desk, create an environment that supports good, upright posture. Without extra support, muscles fatigue and lead to excessive strain on joints and other tissues. Keep these recommendations in mind: CHAIR:   A chair should be able to slide under your desk when your back makes contact with the back of the chair. This allows you to work closely.  The chair's height should allow your eyes to be level with the upper part of your monitor and your hands to be slightly lower than your elbows. BODY POSITION  Your feet should make contact with the floor. If this is not possible, use a foot rest.  Keep your ears over your shoulders. This will reduce stress on your neck and low back. INCORRECT SITTING POSTURES   If you are feeling tired and unable to assume a healthy sitting posture, do not slouch or slump. This puts excessive strain on your back tissues, causing more damage and pain. Healthier options include:  Using more support, like a lumbar pillow.  Switching tasks to something that requires you to be upright or walking.  Talking a brief walk.  Lying down to rest in a neutral-spine position. PROLONGED STANDING WHILE SLIGHTLY LEANING FORWARD  When completing a task that requires you to lean forward while standing in one place for a long time, place either foot up on a stationary 2-4 inch high object to help maintain the best posture. When both feet are on the ground, the low back tends to lose its slight inward curve. If this curve flattens (or becomes too large), then the back and your other joints will experience too much stress, fatigue more quickly and can cause pain.  CORRECT STANDING POSTURES Proper standing posture should be assumed with all daily activities, even if they only take a few moments, like when brushing your teeth. As in sitting, your ears should fall over your shoulders and your shoulders should fall over your hips. You should keep a slight tension in your abdominal  muscles to brace your spine. Your tailbone should point down to the ground, not behind your body, resulting in an over-extended swayback posture.  INCORRECT STANDING POSTURES  Common incorrect standing postures include a forward head, locked knees and/or an excessive swayback. WALKING Walk with an upright posture. Your ears, shoulders and hips should all line-up. PROLONGED ACTIVITY IN A FLEXED POSITION When completing a task that requires you to bend forward   at your waist or lean over a low surface, try to find a way to stabilize 3 of 4 of your limbs. You can place a hand or elbow on your thigh or rest a knee on the surface you are reaching across. This will provide you more stability so that your muscles do not fatigue as quickly. By keeping your knees relaxed, or slightly bent, you will also reduce stress across your low back. CORRECT LIFTING TECHNIQUES DO :   Assume a wide stance. This will provide you more stability and the opportunity to get as close as possible to the object which you are lifting.  Tense your abdominals to brace your spine; then bend at the knees and hips. Keeping your back locked in a neutral-spine position, lift using your leg muscles. Lift with your legs, keeping your back straight.  Test the weight of unknown objects before attempting to lift them.  Try to keep your elbows locked down at your sides in order get the best strength from your shoulders when carrying an object.  Always ask for help when lifting heavy or awkward objects. INCORRECT LIFTING TECHNIQUES DO NOT:   Lock your knees when lifting, even if it is a small object.  Bend and twist. Pivot at your feet or move your feet when needing to change directions.  Assume that you cannot safely pick up a paperclip without proper posture.   This information is not intended to replace advice given to you by your health care provider. Make sure you discuss any questions you have with your health care provider.     Document Released: 11/16/2005 Document Revised: 04/02/2015 Document Reviewed: 02/28/2009 [employer] Interactive Patient Education 2016 [employer]  

## 2016-06-08 NOTE — ED Notes (Signed)
RN Sharion SettlerGabbie informed pt was in room and pt has not been triaged but EKG was given to Dr Elesa MassedWard and vitals has been done and entered

## 2016-06-08 NOTE — ED Notes (Signed)
Pt arrives by POV with multiple complaints including SOB, headache, sciatic pain, leg numbness.

## 2016-06-08 NOTE — ED Provider Notes (Signed)
CSN: [id number]     Arrival date & time 06/08/16  4098 History   First MD Initiated Contact with Patient 06/08/16 [phone]     Chief Complaint  Patient presents with  . Shortness of Breath  . Numbness     (Consider location/radiation/quality/duration/timing/severity/associated sxs/prior Treatment) HPI Comments: Patient presents to the ED with a chief complaint of right lower extremity numbness and pain.  She states that she has a long history of sciatica, but states that this is worse.  She also reports having a new sharp right sided headache and SOB that started yesterday.  She denies any other numbness, weakness, or tingling.  Denies any speech deficits.  She reports having had recent TKA on 05/11/16.  States that she has been very immobile since then because of pain in her knee and the sciatica.  In relation to the SOB, she denies any fever or cough.    The history is provided by the patient. No language interpreter was used.    Past Medical History  Diagnosis Date  . Thyroid disease   . Hypertension   . Osteoarthritis   . Hypothyroidism    Past Surgical History  Procedure Laterality Date  . Hip pinning Left 2012    x3   pin removed last surgery  . Total knee arthroplasty Left 02/17/2016    Procedure: TOTAL KNEE ARTHROPLASTY;  Surgeon: Dannielle Huh, MD;  Location: [facility] OR;  Service: Orthopedics;  Laterality: Left;  . Back surgery  82  . Joint replacement    . Total knee arthroplasty Right 05/11/2016    Procedure: TOTAL KNEE ARTHROPLASTY;  Surgeon: Dannielle Huh, MD;  Location: [facility] OR;  Service: Orthopedics;  Laterality: Right;   Family History  Problem Relation Age of Onset  . Hypertension Mother   . Hypertension Father   . Stroke Father   . Hypertension Brother    Social History  Substance Use Topics  . Smoking status: Current Some Day Smoker -- 0.25 packs/day for 40 years    Types: Cigarettes  . Smokeless tobacco: Never Used  . Alcohol Use: No   OB History    No data  available     Review of Systems  Constitutional: Negative for fever and chills.  Respiratory: Negative for shortness of breath.   Cardiovascular: Negative for chest pain.  Gastrointestinal: Negative for nausea, vomiting, diarrhea and constipation.  Genitourinary: Negative for dysuria.  Musculoskeletal: Positive for back pain.  Neurological: Positive for numbness and headaches.  All other systems reviewed and are negative.     Allergies  Review of patient's allergies indicates no known allergies.  Home Medications   Prior to Admission medications   Medication Sig Start Date End Date Taking? Authorizing Provider  celecoxib (CELEBREX) 200 MG capsule Take 1 capsule (200 mg total) by mouth 2 (two) times daily. 05/12/16   Guy Sandifer, PA  Cholecalciferol (VITAMIN D PO) Take 5,000 Units by mouth daily.     Historical Provider, MD  enoxaparin (LOVENOX) 40 MG/0.4ML injection Inject 0.4 mLs (40 mg total) into the skin daily. 05/12/16   Guy Sandifer, PA  hydrochlorothiazide (HYDRODIURIL) 25 MG tablet Take 25 mg by mouth daily.    Historical Provider, MD  HYDROcodone-acetaminophen (NORCO) 10-325 MG tablet Take 1 tablet by mouth every 6 (six) hours as needed. 05/12/16   Guy Sandifer, PA  levothyroxine (SYNTHROID, LEVOTHROID) 125 MCG tablet Take 125 mcg by mouth daily before breakfast.    Historical Provider, MD  Magnesium 400 MG  CAPS Take 400 mg by mouth daily.    Historical Provider, MD  methocarbamol (ROBAXIN) 500 MG tablet Take 1-2 tablets (500-1,000 mg total) by mouth every 6 (six) hours as needed for muscle spasms. 05/12/16   Guy Sandiferolby Alan Robbins, PA   BP 158/91 mmHg  Pulse 88  Temp(Src) 97.7 F (36.5 C) (Oral)  Resp 18  SpO2 96% Physical Exam  Constitutional: She is oriented to person, place, and time. She appears well-developed and well-nourished.  HENT:  Head: Normocephalic and atraumatic.  Eyes: Conjunctivae and EOM are normal. Pupils are equal, round, and reactive  to light.  Neck: Normal range of motion. Neck supple.  Cardiovascular: Normal rate and regular rhythm.  Exam reveals no gallop and no friction rub.   No murmur heard. Pulmonary/Chest: Effort normal and breath sounds normal. No respiratory distress. She has no wheezes. She has no rales. She exhibits no tenderness.  CTAB  Abdominal: Soft. Bowel sounds are normal. She exhibits no distension and no mass. There is no tenderness. There is no rebound and no guarding.  Musculoskeletal: Normal range of motion. She exhibits no edema or tenderness.  Recent right TKA ROM and strength limited by pain  Neurological: She is alert and oriented to person, place, and time.  Sensation intact throughout CN 3-12 intact Speech is clear Movements goal oriented  Skin: Skin is warm and dry.  Psychiatric: She has a normal mood and affect. Her behavior is normal. Judgment and thought content normal.  Nursing note and vitals reviewed.   ED Course  Procedures (including critical care time) Results for orders placed or performed during the hospital encounter of 06/08/16  D-dimer, quantitative (not at [facility])  Result Value Ref Range   D-Dimer, Quant 2.16 (H) 0.00 - 0.50 ug/mL-FEU  Protime-INR  Result Value Ref Range   Prothrombin Time 13.9 11.6 - 15.2 seconds   INR 1.05 0.00 - 1.49  APTT  Result Value Ref Range   aPTT 28 24 - 37 seconds  CBC  Result Value Ref Range   WBC 12.9 (H) 4.0 - 10.5 K/uL   RBC 4.82 3.87 - 5.11 MIL/uL   Hemoglobin 14.6 12.0 - 15.0 g/dL   HCT 02.744.2 25.336.0 - 66.446.0 %   MCV 91.7 78.0 - 100.0 fL   MCH 30.3 26.0 - 34.0 pg   MCHC 33.0 30.0 - 36.0 g/dL   RDW 40.313.8 47.411.5 - 25.915.5 %   Platelets 359 150 - 400 K/uL  Differential  Result Value Ref Range   Neutrophils Relative % 62 %   Neutro Abs 8.0 (H) 1.7 - 7.7 K/uL   Lymphocytes Relative 27 %   Lymphs Abs 3.5 0.7 - 4.0 K/uL   Monocytes Relative 6 %   Monocytes Absolute 0.8 0.1 - 1.0 K/uL   Eosinophils Relative 5 %   Eosinophils Absolute 0.6  0.0 - 0.7 K/uL   Basophils Relative 0 %   Basophils Absolute 0.0 0.0 - 0.1 K/uL  Comprehensive metabolic panel  Result Value Ref Range   Sodium 137 135 - 145 mmol/L   Potassium 3.6 3.5 - 5.1 mmol/L   Chloride 101 101 - 111 mmol/L   CO2 26 22 - 32 mmol/L   Glucose, Bld 150 (H) 65 - 99 mg/dL   BUN 13 6 - 20 mg/dL   Creatinine, Ser 5.630.72 0.44 - 1.00 mg/dL   Calcium 9.6 8.9 - 87.510.3 mg/dL   Total Protein 7.3 6.5 - 8.1 g/dL   Albumin 3.8 3.5 - 5.0 g/dL  AST 18 15 - 41 U/L   ALT 17 14 - 54 U/L   Alkaline Phosphatase 66 38 - 126 U/L   Total Bilirubin 0.7 0.3 - 1.2 mg/dL   GFR calc non Af Amer >60 >60 mL/min   GFR calc Af Amer >60 >60 mL/min   Anion gap 10 5 - 15  I-stat troponin, ED (not at [facility])  Result Value Ref Range   Troponin i, poc 0.00 0.00 - 0.08 ng/mL   Comment 3           Dg Chest 2 View  06/08/2016  CLINICAL DATA:  Shortness of breath for 2 days EXAM: CHEST  2 VIEW COMPARISON:  February 06, 2016 FINDINGS: Lungs are clear. Heart size and pulmonary vascularity are normal. No adenopathy. There is thoracolumbar dextroscoliosis. IMPRESSION: No edema or consolidation. Electronically Signed   By: Bretta Bang III M.D.   On: 06/08/2016 07:24   Ct Head Wo Contrast  06/08/2016  CLINICAL DATA:  Right-sided headache beginning yesterday. EXAM: CT HEAD WITHOUT CONTRAST TECHNIQUE: Contiguous axial images were obtained from the base of the skull through the vertex without intravenous contrast. COMPARISON:  None. FINDINGS: No sign of acute infarction, mass lesion, hemorrhage, hydrocephalus or extra-axial collection. Slight outpouching of the lateral margin of the mid body of the right lateral ventricle could relate to an old deep white matter insult) prenatal or perinatal). This is probably incidental at this time. The calvarium is unremarkable. Sinuses, middle ears and mastoids are clear. IMPRESSION: No acute or significant finding. Lateral out pouching from the body of the right lateral  ventricle could relate to a prenatal or perinatal deep white matter insult. The exam does not show a pattern of acquired small-vessel disease of the white matter. Electronically Signed   By: Paulina Fusi M.D.   On: 06/08/2016 07:03   Ct Angio Chest Pe W Or Wo Contrast  06/08/2016  CLINICAL DATA:  Elevated D-dimer, shortness of breath. EXAM: CT ANGIOGRAPHY CHEST WITH CONTRAST TECHNIQUE: Multidetector CT imaging of the chest was performed using the standard protocol during bolus administration of intravenous contrast. Multiplanar CT image reconstructions and MIPs were obtained to evaluate the vascular anatomy. CONTRAST:  50 cc Isovue 370 IV COMPARISON:  06/08/2016 FINDINGS: Cardiovascular: No filling defects in the pulmonary arteries to suggest pulmonary emboli. Heart is normal size. Aorta is normal caliber. Mediastinum/Nodes: No mediastinal, hilar, or axillary adenopathy. Lungs/Pleura: Minimal dependent atelectasis. Lungs otherwise clear. No pleural effusions. Upper Abdomen: Imaging into the upper abdomen shows no acute findings. Musculoskeletal: No acute bony abnormality or focal bone lesion. Mild degenerative spurring in the thoracic spine. Review of the MIP images confirms the above findings. IMPRESSION: No evidence of pulmonary embolus. No acute findings in the chest. Electronically Signed   By: Charlett Nose M.D.   On: 06/08/2016 09:10    I have personally reviewed and evaluated these images and lab results as part of my medical decision-making.   EKG Interpretation   Date/Time:  Monday June 08 2016 05:59:20 EDT Ventricular Rate:  96 PR Interval:  136 QRS Duration: 88 QT Interval:  330 QTC Calculation: 416 R Axis:   -71 Text Interpretation:  Normal sinus rhythm Pulmonary disease pattern Left  anterior fascicular block Abnormal ECG No significant change since last  tracing Confirmed by WARD,  DO, KRISTEN ([id number]) on 06/08/2016 6:10:27 AM      MDM   Final diagnoses:  Sciatica of right side   SOB (shortness of breath)  Nonintractable  headache, unspecified chronicity pattern, unspecified headache type    Patient with recent TKA, complains of right LE pain/numbness.  Hx of sciatica, but states this is worse numbness.  Also complains of headache and SOB.  Given recent surgery and immobilization, will check CT head and d-dimer.  CT head is negative for acute findings, there is a right lateral outpouching from the body of the right lateral ventricle, which could have been from prenatal or perinatal deep white matter insult. Discusses with Dr. Jeraldine Loots, who agrees the findings likely incidental, no further workup needed.  D-dimer is elevated at greater than 2. Will check CT angiogram of chest to rule out PE given shortness of breath.  CT chest is negative for PE or subclinical pneumonia.  Patient with back pain.  No neurological deficits and normal neuro exam.  Patient is ambulatory.  No loss of bowel or bladder control.  Doubt cauda equina.  Denies fever,  doubt epidural abscess or other lesion. Recommend back exercises, stretching, RICE, and will treat with a short course of prednisone.  Encouraged the patient that there could be a need for additional workup and/or imaging such as MRI, if the symptoms do not resolve. Patient advised that if the back pain does not resolve, or radiates, this could progress to more serious conditions and is encouraged to follow-up with PCP or orthopedics within 2 weeks.      Roxy Horseman, PA-C 06/08/16 0933  Roxy Horseman, PA-C 06/09/16 1610  Gerhard Munch, MD 06/10/16 1620

## 2016-06-08 NOTE — ED Notes (Signed)
Patient transported to CT 

## 2016-11-07 IMAGING — CT CT HEAD W/O CM
4 series · 16 of 47 positions shown, 18 images · non-contrast
Comparison: None.

CLINICAL DATA: Right-sided headache beginning yesterday.

EXAM:
CT HEAD WITHOUT CONTRAST
TECHNIQUE: Contiguous axial images were obtained from the base of the skull
through the vertex without intravenous contrast.

[Series 2: head without · axial · non-contrast · 0.39mm/px · z∈[-129,-19]mm · 7 of 30 slices shown, 9 images]
[im 4/30  brain]
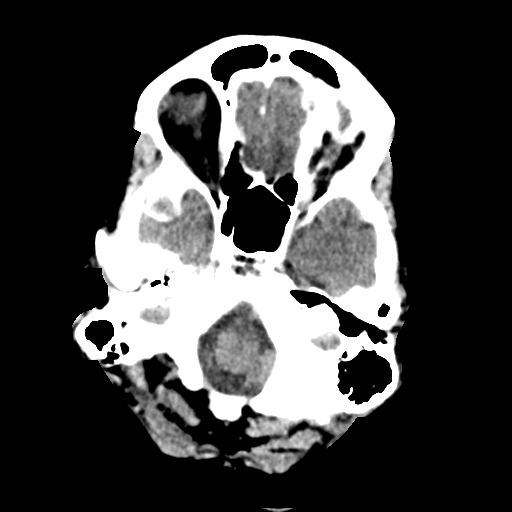
[im 4/30  bone]
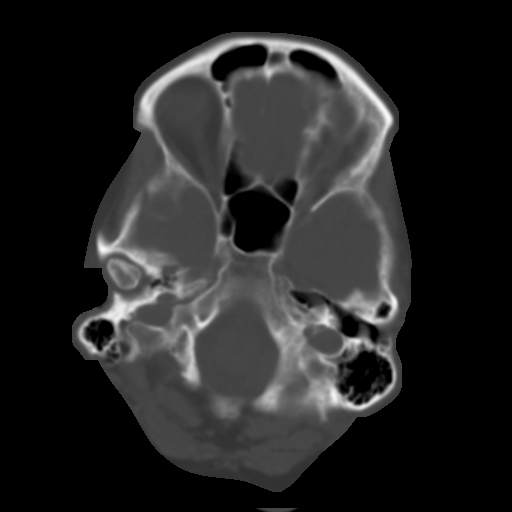
[im 8/30  brain]
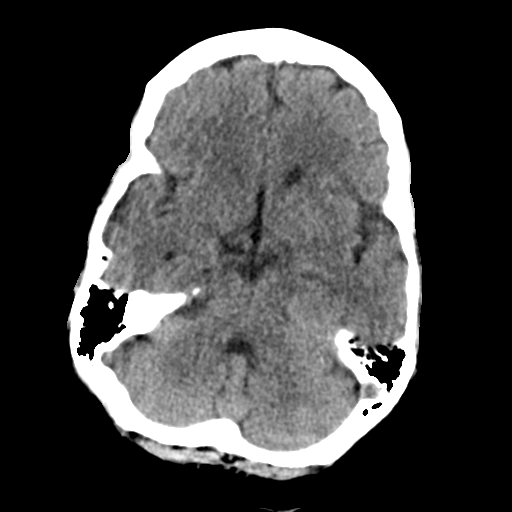
[im 11/30  brain]
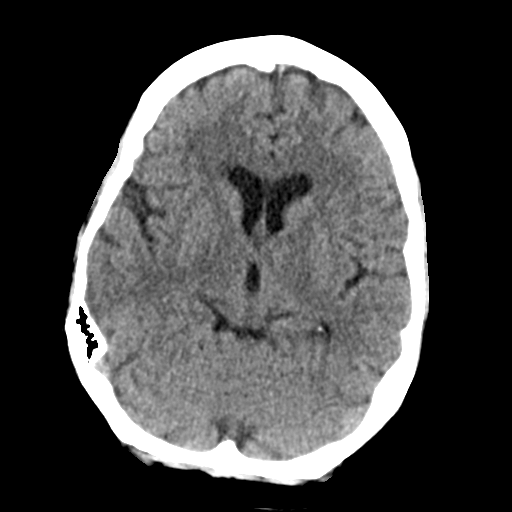
[im 15/30  brain]
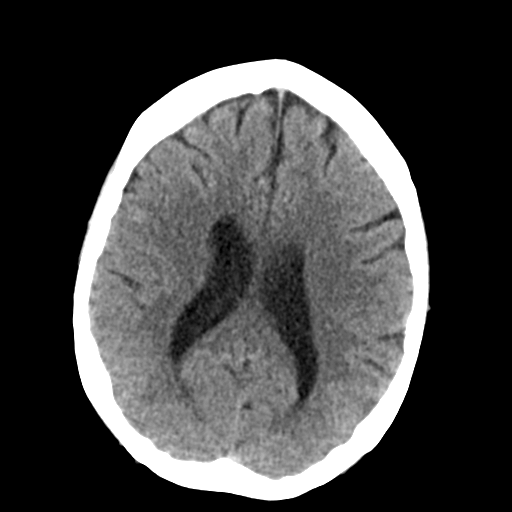
[im 19/30  brain]
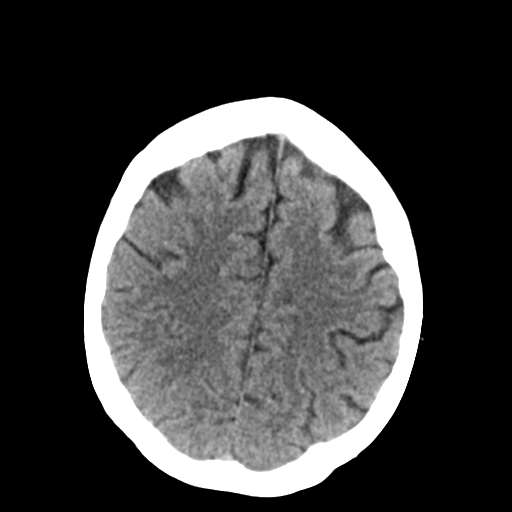
[im 19/30  bone]
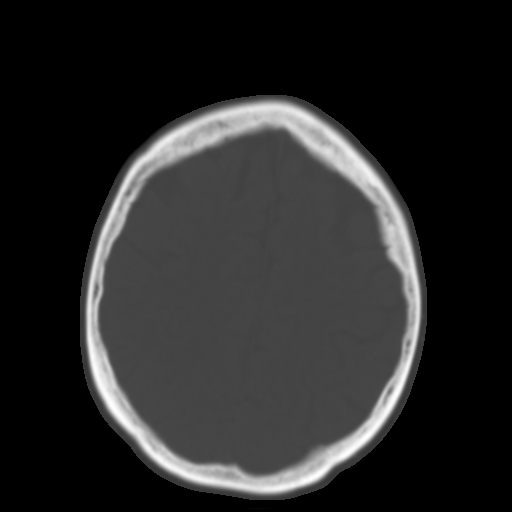
[im 22/30  brain]
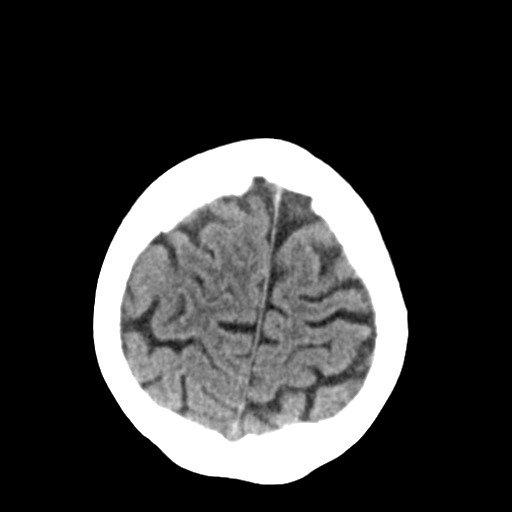
[im 26/30  brain]
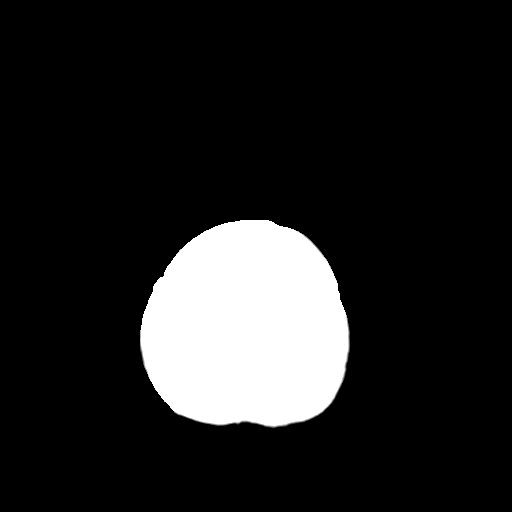

[Series 3: head bone · axial · 0.39mm/px · z∈[-130,-100]mm · 3 of 75 slices shown]
[im 8/75  bone]
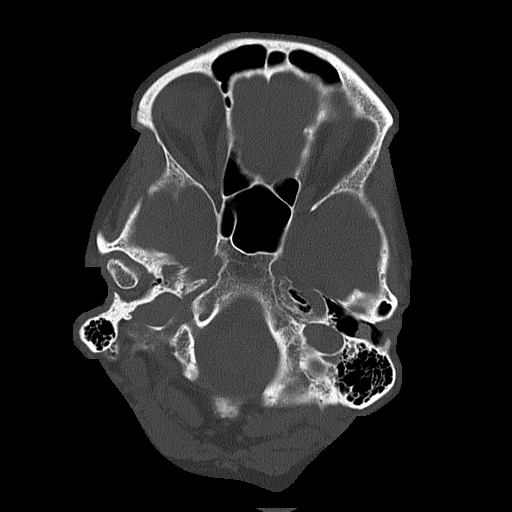
[im 15/75  bone]
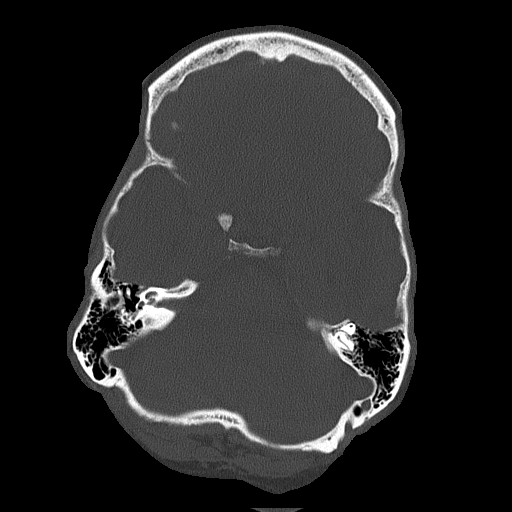
[im 23/75  bone]
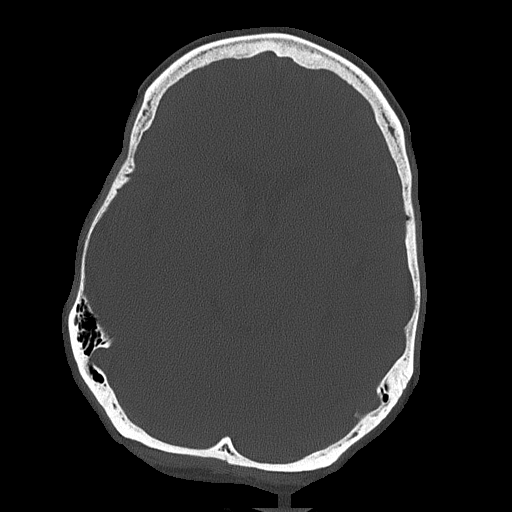

[Series 4: head without cor · coronal · non-contrast · 0.29mm/px · 3 of 63 slices shown]
[im 21/63  brain]
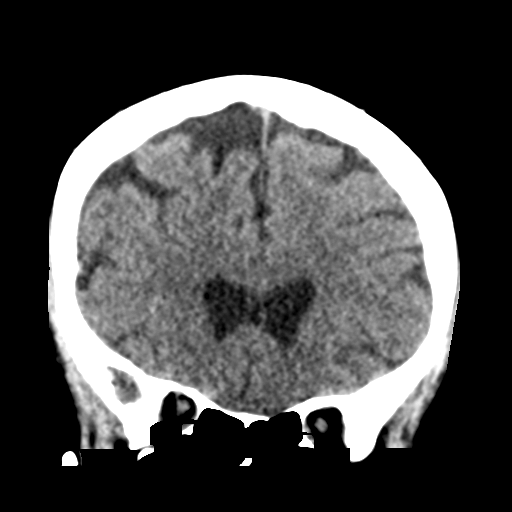
[im 28/63  brain]
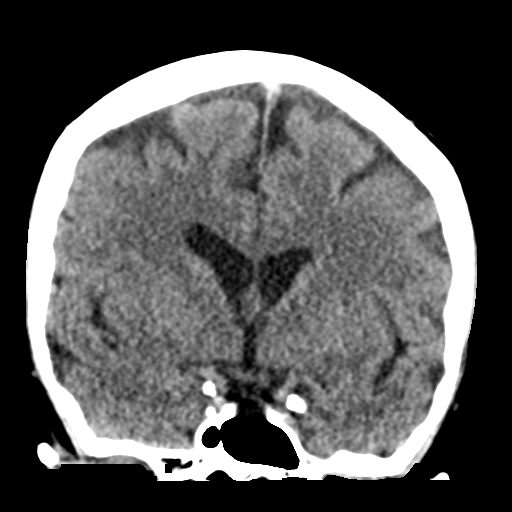
[im 35/63  brain]
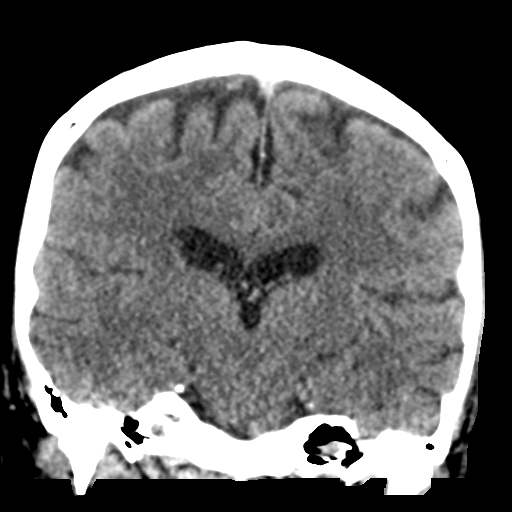

[Series 5: head without sag · sagittal · non-contrast · 0.29mm/px · 3 of 51 slices shown]
[im 17/51  brain]
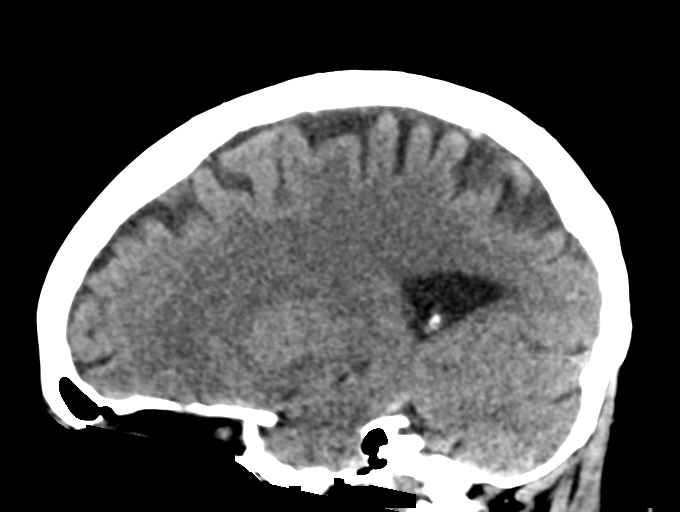
[im 26/51  brain]
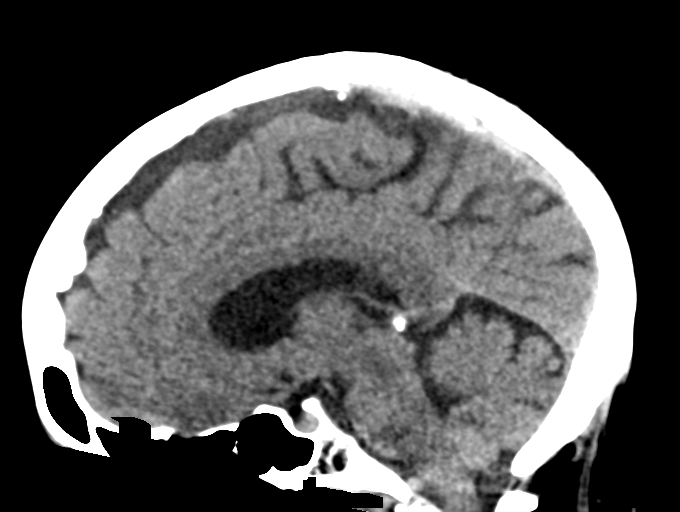
[im 34/51  brain]
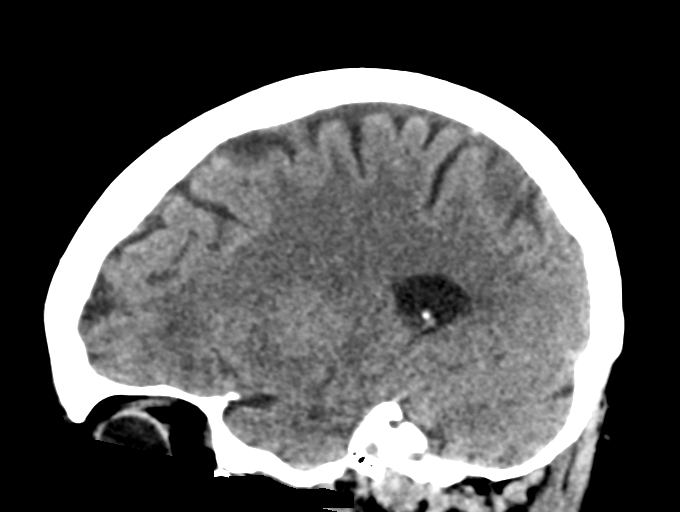

[16 of 47 positions shown; findings below may reference images not displayed]

FINDINGS: No sign of acute infarction, mass lesion, hemorrhage, hydrocephalus
or extra-axial collection. Slight outpouching of the lateral margin
of the mid body of the right lateral ventricle could relate to an
old deep white matter insult) prenatal or perinatal). This is
probably incidental at this time. The calvarium is unremarkable.
Sinuses, middle ears and mastoids are clear.
IMPRESSION: No acute or significant finding. Lateral out pouching from the body
of the right lateral ventricle could relate to a prenatal or
perinatal deep white matter insult. The exam does not show a pattern
of acquired small-vessel disease of the white matter.

## 2016-12-20 NOTE — Progress Notes (Signed)
Subjective:    Patient ID: Tiffany Navarro, female    DOB: 09-22-1956, 61 y.o.   MRN: [id number]  HPI Pt is referred by Tomi Bamberger, NP, for hypothyroidism.  Pt reports hypothyroidism was dx'ed in 1997.  she has been on prescribed thyroid hormone therapy since dx.  she has never taken kelp or any other type of non-prescribed thyroid product.  she had [place] many years ago, in [place], but that result is not available.  she has never had thyroid surgery, or XRT to the neck.  she has never been on amiodarone or lithium.  She says she was told she had a problem with the glands near the thyroid.  she has never had osteoporosis, urolithiasis, thyroid probs, parathyroid probs, sarcoidosis, cancer, PUD, pancreatitis, or depression.  He does not take vitamin-A supplements.  Pt denies taking antacids or Li++.  She had fx left hip in 2003.  No other fractures.  She takes vit-D, 500 units qd.  She has slight cramps in the legs, and assoc dry skin.   Past Medical History:  Diagnosis Date  . Hypertension   . Hypothyroidism   . Osteoarthritis   . Thyroid disease     Past Surgical History:  Procedure Laterality Date  . BACK SURGERY  82  . HIP PINNING Left 2012   x3   pin removed last surgery  . JOINT REPLACEMENT    . TOTAL KNEE ARTHROPLASTY Left 02/17/2016   Procedure: TOTAL KNEE ARTHROPLASTY;  Surgeon: Dannielle Huh, MD;  Location: [facility] OR;  Service: Orthopedics;  Laterality: Left;  . TOTAL KNEE ARTHROPLASTY Right 05/11/2016   Procedure: TOTAL KNEE ARTHROPLASTY;  Surgeon: Dannielle Huh, MD;  Location: [facility] OR;  Service: Orthopedics;  Laterality: Right;    Social History   Social History  . Marital status: Widowed    Spouse name: N/A  . Number of children: N/A  . Years of education: N/A   Occupational History  . Not on file.   Social History Main Topics  . Smoking status: Current Some Day Smoker    Packs/day: 0.25    Years: 40.00    Types: Cigarettes  . Smokeless tobacco: Never Used  . Alcohol  use No  . Drug use: No     Comment: 2 cig /day qod  . Sexual activity: Not on file   Other Topics Concern  . Not on file   Social History Narrative  . No narrative on file    Current Outpatient Prescriptions on File Prior to Visit  Medication Sig Dispense Refill  . hydrochlorothiazide (HYDRODIURIL) 25 MG tablet Take 25 mg by mouth daily.    Marland Kitchen levothyroxine (SYNTHROID, LEVOTHROID) 125 MCG tablet Take 125 mcg by mouth daily before breakfast.     No current facility-administered medications on file prior to visit.     No Known Allergies  Family History  Problem Relation Age of Onset  . Hypertension Mother   . Hypertension Father   . Stroke Father   . Hypertension Brother   . Thyroid disease Neg Hx    BP 134/74   Pulse 78   Ht 5\' 5"  (1.651 m)   Wt 169 lb (76.7 kg)   SpO2 95%   BMI 28.12 kg/m   Review of Systems denies depression, hair loss, sob, weight gain, constipation, numbness, blurry vision, rash rhinorrhea, easy bruising, and syncope.  She has cold intolerance and myalgias.    Objective:   Physical Exam VS: see vs page GEN: no distress  HEAD: head: no deformity eyes: no periorbital swelling, no proptosis external nose and ears are normal mouth: no lesion seen NECK: supple, thyroid is not enlarged CHEST WALL: no deformity.  No kyphosis LUNGS: clear to auscultation CV: reg rate and rhythm, no murmur ABD: abdomen is soft, nontender.  no hepatosplenomegaly.  not distended.  no hernia MUSCULOSKELETAL: muscle bulk and strength are grossly normal.  no obvious joint swelling.  gait is normal and steady EXTEMITIES: no edema PULSES: no carotid bruit NEURO:  cn 2-12 grossly intact.   readily moves all 4's.  sensation is intact to touch on all 4's.  No tremor. SKIN:  Normal texture and temperature.  No rash or suspicious lesion is visible.   NODES:  None palpable at the neck PSYCH: alert, well-oriented.  Does not appear anxious nor depressed.   I personally  reviewed electrocardiogram tracing (06/08/16): Indication: back pain Impression: NSR.  No MI.  LAE Compared to earlier same day: no change.    outside test results are reviewed: TSH=4.1  Chest CT: no medtion is made of the thyroid  Lab Results  Component Value Date   PTH 19 12/23/2016   CALCIUM 9.5 12/23/2016   Lab Results  Component Value Date   TSH 3.00 12/23/2016   25-OH vit-D=17    Assessment & Plan:  Chronic primary hypothyroidism: well-replaced Vit-D deficiency, new.  I am unaware of thy parathyroid problem.  I have sent a prescription to your pharmacy, for vit-D.  recheck labs in 1 month  Patient is advised the following: Patient Instructions  blood tests are requested for you today.  We'll let you know about the results.

## 2016-12-23 ENCOUNTER — Ambulatory Visit (INDEPENDENT_AMBULATORY_CARE_PROVIDER_SITE_OTHER): Payer: BLUE CROSS/BLUE SHIELD | Admitting: Endocrinology

## 2016-12-23 ENCOUNTER — Encounter: Payer: Self-pay | Admitting: Endocrinology

## 2016-12-23 VITALS — BP 134/74 | HR 78 | Ht 65.0 in | Wt 169.0 lb

## 2016-12-23 DIAGNOSIS — Z23 Encounter for immunization: Secondary | ICD-10-CM

## 2016-12-23 DIAGNOSIS — E039 Hypothyroidism, unspecified: Secondary | ICD-10-CM | POA: Diagnosis not present

## 2016-12-23 DIAGNOSIS — E215 Disorder of parathyroid gland, unspecified: Secondary | ICD-10-CM | POA: Insufficient documentation

## 2016-12-23 LAB — VITAMIN D 25 HYDROXY (VIT D DEFICIENCY, FRACTURES): VITD: 16.78 ng/mL — AB (ref 30.00–100.00)

## 2016-12-23 LAB — TSH: TSH: 3 u[IU]/mL (ref 0.35–4.50)

## 2016-12-23 NOTE — Patient Instructions (Signed)
blood tests are requested for you today.  We'll let you know about the results.  

## 2016-12-24 LAB — PTH, INTACT AND CALCIUM
Calcium: 9.5 mg/dL (ref 8.6–10.4)
PTH: 19 pg/mL (ref 14–64)

## 2016-12-24 MED ORDER — VITAMIN D (ERGOCALCIFEROL) 1.25 MG (50000 UNIT) PO CAPS: 50000.0000 [IU] | ORAL_CAPSULE | ORAL | 0 refills | Status: AC

## 2017-01-18 ENCOUNTER — Telehealth: Payer: Self-pay | Admitting: Endocrinology

## 2017-01-18 NOTE — Telephone Encounter (Signed)
I contacted the patient and advised Dr. Everardo AllEllison does want her to come back in for blood tests once she has completed the vitamin D supplement via voicemail. Requested a call back from the patient to schedule her lab appointment.

## 2017-01-18 NOTE — Telephone Encounter (Signed)
when she finish taking her last Vitamin D pill, when do she come back in for labs.  Please advise

## 2017-01-21 ENCOUNTER — Other Ambulatory Visit: Payer: BLUE CROSS/BLUE SHIELD

## 2017-06-20 ENCOUNTER — Emergency Department (HOSPITAL_COMMUNITY)
Admission: EM | Admit: 2017-06-20 | Discharge: 2017-06-20 | Disposition: A | Discharge status: Home / Self Care | Payer: BLUE CROSS/BLUE SHIELD | Attending: Emergency Medicine | Admitting: Emergency Medicine

## 2017-06-20 ENCOUNTER — Emergency Department (HOSPITAL_COMMUNITY): Payer: BLUE CROSS/BLUE SHIELD

## 2017-06-20 ENCOUNTER — Encounter (HOSPITAL_COMMUNITY): Payer: Self-pay | Admitting: Emergency Medicine

## 2017-06-20 DIAGNOSIS — Y69 Unspecified misadventure during surgical and medical care: Secondary | ICD-10-CM | POA: Diagnosis not present

## 2017-06-20 DIAGNOSIS — T887XXA Unspecified adverse effect of drug or medicament, initial encounter: Secondary | ICD-10-CM | POA: Diagnosis not present

## 2017-06-20 DIAGNOSIS — R1012 Left upper quadrant pain: Secondary | ICD-10-CM | POA: Insufficient documentation

## 2017-06-20 DIAGNOSIS — Z96653 Presence of artificial knee joint, bilateral: Secondary | ICD-10-CM | POA: Diagnosis not present

## 2017-06-20 DIAGNOSIS — E039 Hypothyroidism, unspecified: Secondary | ICD-10-CM | POA: Diagnosis not present

## 2017-06-20 DIAGNOSIS — R52 Pain, unspecified: Secondary | ICD-10-CM

## 2017-06-20 DIAGNOSIS — Z7984 Long term (current) use of oral hypoglycemic drugs: Secondary | ICD-10-CM | POA: Diagnosis not present

## 2017-06-20 DIAGNOSIS — Z79899 Other long term (current) drug therapy: Secondary | ICD-10-CM | POA: Insufficient documentation

## 2017-06-20 DIAGNOSIS — F17210 Nicotine dependence, cigarettes, uncomplicated: Secondary | ICD-10-CM | POA: Diagnosis not present

## 2017-06-20 DIAGNOSIS — T383X5A Adverse effect of insulin and oral hypoglycemic [antidiabetic] drugs, initial encounter: Secondary | ICD-10-CM | POA: Diagnosis present

## 2017-06-20 DIAGNOSIS — I10 Essential (primary) hypertension: Secondary | ICD-10-CM | POA: Insufficient documentation

## 2017-06-20 LAB — I-STAT CG4 LACTIC ACID, ED: Lactic Acid, Venous: 1.54 mmol/L (ref 0.5–1.9)

## 2017-06-20 LAB — URINALYSIS, ROUTINE W REFLEX MICROSCOPIC
Bilirubin Urine: NEGATIVE
GLUCOSE, UA: NEGATIVE mg/dL
Hgb urine dipstick: NEGATIVE
KETONES UR: NEGATIVE mg/dL
Leukocytes, UA: NEGATIVE
Nitrite: NEGATIVE
PH: 7 (ref 5.0–8.0)
Protein, ur: NEGATIVE mg/dL
Specific Gravity, Urine: 1.004 — ABNORMAL LOW (ref 1.005–1.030)

## 2017-06-20 LAB — CBC WITH DIFFERENTIAL/PLATELET
Basophils Absolute: 0.1 10*3/uL (ref 0.0–0.1)
Basophils Relative: 0 %
Eosinophils Absolute: 0.4 10*3/uL (ref 0.0–0.7)
Eosinophils Relative: 3 %
HCT: 43 % (ref 36.0–46.0)
Hemoglobin: 14.9 g/dL (ref 12.0–15.0)
Lymphocytes Relative: 25 %
Lymphs Abs: 3.6 10*3/uL (ref 0.7–4.0)
MCH: 31.2 pg (ref 26.0–34.0)
MCHC: 34.7 g/dL (ref 30.0–36.0)
MCV: 90 fL (ref 78.0–100.0)
Monocytes Absolute: 0.9 10*3/uL (ref 0.1–1.0)
Monocytes Relative: 6 %
Neutro Abs: 9.2 10*3/uL — ABNORMAL HIGH (ref 1.7–7.7)
Neutrophils Relative %: 66 %
Platelets: 301 10*3/uL (ref 150–400)
RBC: 4.78 MIL/uL (ref 3.87–5.11)
RDW: 13.3 % (ref 11.5–15.5)
WBC: 14.1 10*3/uL — ABNORMAL HIGH (ref 4.0–10.5)

## 2017-06-20 LAB — COMPREHENSIVE METABOLIC PANEL
ALT: 31 U/L (ref 14–54)
AST: 24 U/L (ref 15–41)
Albumin: 4.3 g/dL (ref 3.5–5.0)
Alkaline Phosphatase: 69 U/L (ref 38–126)
Anion gap: 12 (ref 5–15)
BUN: 12 mg/dL (ref 6–20)
CO2: 27 mmol/L (ref 22–32)
Calcium: 9.4 mg/dL (ref 8.9–10.3)
Chloride: 101 mmol/L (ref 101–111)
Creatinine, Ser: 0.7 mg/dL (ref 0.44–1.00)
GFR calc Af Amer: >60 mL/min (ref >60)
GFR calc non Af Amer: >60 mL/min (ref >60)
Glucose, Bld: 125 mg/dL — ABNORMAL HIGH (ref 65–99)
Potassium: 3.8 mmol/L (ref 3.5–5.1)
Sodium: 140 mmol/L (ref 135–145)
Total Bilirubin: 0.3 mg/dL (ref 0.3–1.2)
Total Protein: 7.6 g/dL (ref 6.5–8.1)

## 2017-06-20 LAB — CK: Total CK: 81 U/L (ref 38–234)

## 2017-06-20 LAB — LIPASE, BLOOD: Lipase: 17 U/L (ref 11–51)

## 2017-06-20 MED ORDER — IOPAMIDOL (ISOVUE-300) INJECTION 61%
INTRAVENOUS | Status: AC
Start: 2017-06-20 — End: 2017-06-20
  Administered 2017-06-20: 100 mL
  Filled 2017-06-20: qty 100

## 2017-06-20 NOTE — ED Triage Notes (Signed)
Pt reports that since she was placed on 1000 mg Metformin x2 daily, she has had worsening body aches, kidney pain and weakness. Pt sts that this has increased over past 2 days. Pt denies urinary s/sx, CP or dizziness. Pt sts that she has had some SOB x2 weeks. Pt is A&O and ambulatory.

## 2017-06-20 NOTE — Discharge Instructions (Addendum)
Please read attached information. If you experience any new or worsening signs or symptoms please return to the emergency room for evaluation. Please follow-up with your primary care provider or specialist as discussed.  °

## 2017-06-20 NOTE — ED Provider Notes (Signed)
[facility]-EMERGENCY DEPT Provider Note   CSN: [id number] Arrival date & time: 06/20/17  1300     History   Chief Complaint Chief Complaint  Patient presents with  . Generalized Body Aches  . Back Pain  . Weakness    HPI Tiffany Navarro is a 61 y.o. female.  HPI   61 year old female presents today with complaints of generalized weakness.  Patient notes that approximately 3 months ago she was started on the generic form of Lipitor.  She notes that shortly after starting a medication she started to have pain in her left upper quadrant and was told that this was likely her pancreas, she notes no labs or imaging at that time.  She notes she was feeling sick like at that time but improved after stopping the medication.  Patient prior to this had no elevation in her blood sugar with a reassuring A1c.  She notes shortly after stopping the Lipitor she started having elevated blood sugar readings as high as 318.  On July 6 she was started on metformin for hyperglycemia.  She notes that during the first week she was feeling okay, but then slowly developed generalized body aches, heaviness, cold sweats, trouble sleeping and restlessness.  She notes still vague discomfort in her left upper quadrant, and also notes generalized pain in her right lower pelvic and inguinal region.  Patient notes she stopped taking the metformin yesterday, was taking 500 mg in the morning and 500 in the evening.  Patient denies any fevers, jaundice, changes in urine or bowel movements.  She denies any history of the same.    Past Medical History:  Diagnosis Date  . Hypertension   . Hypothyroidism   . Osteoarthritis   . Thyroid disease     Patient Active Problem List   Diagnosis Date Noted  . Parathyroid disease (HCC) 12/23/2016  . S/P total knee replacement 02/17/2016  . Hypertriglyceridemia 05/30/2014  . Hypothyroidism 01/22/2014  . Essential hypertension, benign 01/22/2014  . Osteoarthritis of left knee  01/22/2014    Past Surgical History:  Procedure Laterality Date  . BACK SURGERY  82  . HIP PINNING Left 2012   x3   pin removed last surgery  . JOINT REPLACEMENT    . TOTAL KNEE ARTHROPLASTY Left 02/17/2016   Procedure: TOTAL KNEE ARTHROPLASTY;  Surgeon: Dannielle HuhSteve Lucey, MD;  Location: [facility] OR;  Service: Orthopedics;  Laterality: Left;  . TOTAL KNEE ARTHROPLASTY Right 05/11/2016   Procedure: TOTAL KNEE ARTHROPLASTY;  Surgeon: Dannielle HuhSteve Lucey, MD;  Location: [facility] OR;  Service: Orthopedics;  Laterality: Right;    OB History    No data available       Home Medications    Prior to Admission medications   Medication Sig Start Date End Date Taking? Authorizing Provider  gabapentin (NEURONTIN) 300 MG capsule Take 300 mg by mouth 2 (two) times daily.    Yes [provider]  hydrochlorothiazide (HYDRODIURIL) 25 MG tablet Take 25 mg by mouth daily.   Yes [provider]  levothyroxine (SYNTHROID, LEVOTHROID) 125 MCG tablet Take 125 mcg by mouth daily before breakfast.   Yes [provider]  meloxicam (MOBIC) 15 MG tablet Take 15 mg by mouth daily.   Yes [provider]  metFORMIN (GLUCOPHAGE) 1000 MG tablet Take 1,000 mg by mouth 2 (two) times daily. 06/04/17  Yes [provider]  Omeprazole Magnesium 20.6 (20 Base) MG CPDR Take 20 mg by mouth daily.   Yes [provider]  traMADol (  ULTRAM) 50 MG tablet Take 50 mg by mouth 2 (two) times daily.    Yes [provider]    Family History Family History  Problem Relation Age of Onset  . Hypertension Mother   . Hypertension Father   . Stroke Father   . Hypertension Brother   . Thyroid disease Neg Hx     Social History Social History  Substance Use Topics  . Smoking status: Current Some Day Smoker    Packs/day: 0.25    Years: 40.00    Types: Cigarettes  . Smokeless tobacco: Never Used  . Alcohol use No     Allergies   Patient has no known allergies.   Review of Systems Review  of Systems  All other systems reviewed and are negative.    Physical Exam Updated Vital Signs BP (!) 144/96 (BP Location: Left Arm)   Pulse 63   Temp 98.3 F (36.8 C) (Oral)   Resp 18   Ht 5\' 5"  (1.651 m)   Wt 77.1 kg (170 lb)   SpO2 96%   BMI 28.29 kg/m   Physical Exam  Constitutional: She is oriented to person, place, and time. She appears well-developed and well-nourished.  HENT:  Head: Normocephalic and atraumatic.  Eyes: Pupils are equal, round, and reactive to light. Conjunctivae are normal. Right eye exhibits no discharge. Left eye exhibits no discharge. No scleral icterus.  Neck: Normal range of motion. No JVD present. No tracheal deviation present.  Pulmonary/Chest: Effort normal. No stridor.  Abdominal: She exhibits no distension and no mass. There is no tenderness. There is no rebound. No hernia.  Minor TTP of right lower pelvic region/ groin-no masses or signs of infection  Neurological: She is alert and oriented to person, place, and time. Coordination normal.  Psychiatric: She has a normal mood and affect. Her behavior is normal. Judgment and thought content normal.  Nursing note and vitals reviewed.   ED Treatments / Results  Labs (all labs ordered are listed, but only abnormal results are displayed) Labs Reviewed  CBC WITH DIFFERENTIAL/PLATELET - Abnormal; Notable for the following:       Result Value   WBC 14.1 (*)    Neutro Abs 9.2 (*)    All other components within normal limits  COMPREHENSIVE METABOLIC PANEL - Abnormal; Notable for the following:    Glucose, Bld 125 (*)    All other components within normal limits  URINALYSIS, ROUTINE W REFLEX MICROSCOPIC - Abnormal; Notable for the following:    Specific Gravity, Urine 1.004 (*)    All other components within normal limits  LIPASE, BLOOD  CK  I-STAT CG4 LACTIC ACID, ED  I-STAT CG4 LACTIC ACID, ED    EKG  EKG Interpretation None       Radiology Ct Abdomen Pelvis W Contrast  Result  Date: 06/20/2017 CLINICAL DATA:  Abdominal pain EXAM: CT ABDOMEN AND PELVIS WITH CONTRAST TECHNIQUE: Multidetector CT imaging of the abdomen and pelvis was performed using the standard protocol following bolus administration of intravenous contrast. CONTRAST:  ISOVUE-300 IOPAMIDOL (ISOVUE-300) INJECTION 61% COMPARISON:  None. FINDINGS: Lower chest: Mild dependent atelectasis the lung bases. Hepatobiliary: Liver is notable for hepatic steatosis focal fatty sparing along the gallbladder fossa. Gallbladder is unremarkable. No intrahepatic or extrahepatic ductal dilatation. Pancreas: Within normal limits. Spleen: Within normal limits. Adrenals/Urinary Tract: Adrenal glands are within normal limits. 9 mm cyst along the lateral left lower kidney (series 3/image 15). Right kidney is within normal limits. No hydronephrosis. Bladder  is within normal limits. Stomach/Bowel: Stomach is within normal limits. No evidence of bowel obstruction. Normal appendix (series 2/ image 64). Sigmoid diverticulosis, without evidence of diverticulitis. Vascular/Lymphatic: No evidence of abdominal aortic aneurysm. Mild atherosclerotic calcifications the abdominal aorta. No suspicious abdominopelvic lymphadenopathy. Reproductive: Uterus is within normal limits. Bilateral ovaries are within normal limits. Other: No abdominopelvic ascites. Musculoskeletal: Degenerative changes of the visualized thoracolumbar spine. IMPRESSION: No evidence of bowel obstruction.  Normal appendix. Sigmoid diverticulosis, without evidence of diverticulitis. Hepatic steatosis with focal fatty sparing. No CT findings to account for the patient's abdominal pain. Electronically Signed   By: Charline Bills M.D.   On: 06/20/2017 16:15    Procedures Procedures (including critical care time)  Medications Ordered in ED Medications  iopamidol (ISOVUE-300) 61 % injection (100 mLs  Contrast Given 06/20/17 1550)     Initial Impression / Assessment and Plan / ED  Course  I have reviewed the triage vital signs and the nursing notes.  Pertinent labs & imaging results that were available during my care of the patient were reviewed by me and considered in my medical decision making (see chart for details).      Final Clinical Impressions(s) / ED Diagnoses   Final diagnoses:  Body aches  Adverse effect of metformin, initial encounter    Labs: I-STAT lactic acid, CBC, CMP, lipase, CK,, urinalysis  Imaging: CT abdomen pelvis with contrast  Consults:  Therapeutics:  Discharge Meds:   Assessment/Plan: 61 year old female presents today with generalized body aches.  Patient recently started statin which caused fatigue.  She stopped that and was placed on metformin.  Her symptoms are likely related to recent medication, she has very reassuring workup here with no significant abnormalities on her laboratory analysis, reassuring CT.  Patient does have very minor tenderness to the right lower groin, likely muscular in nature, no signs of infectious etiology.  Patient is instructed to discontinue using metformin, follow up with her primary care she will return to emergency room if she develops any new or worsening signs or symptoms.  She verbalized understanding and agreement to today's plan had no further questions or concerns.    New Prescriptions New Prescriptions   No medications on file     Rosalio Loud 06/20/17 1653    Alvira Monday, MD 06/21/17 [phone]

## 2017-09-22 ENCOUNTER — Encounter (HOSPITAL_COMMUNITY): Payer: Self-pay | Admitting: Emergency Medicine

## 2017-09-22 ENCOUNTER — Emergency Department (HOSPITAL_COMMUNITY)
Admission: EM | Admit: 2017-09-22 | Discharge: 2017-09-22 | Disposition: A | Discharge status: Home / Self Care | Payer: BLUE CROSS/BLUE SHIELD | Attending: Emergency Medicine | Admitting: Emergency Medicine

## 2017-09-22 ENCOUNTER — Ambulatory Visit (INDEPENDENT_AMBULATORY_CARE_PROVIDER_SITE_OTHER): Payer: BLUE CROSS/BLUE SHIELD | Admitting: Endocrinology

## 2017-09-22 ENCOUNTER — Encounter: Payer: Self-pay | Admitting: Endocrinology

## 2017-09-22 ENCOUNTER — Other Ambulatory Visit: Payer: Self-pay

## 2017-09-22 ENCOUNTER — Telehealth: Payer: Self-pay | Admitting: Endocrinology

## 2017-09-22 DIAGNOSIS — E119 Type 2 diabetes mellitus without complications: Secondary | ICD-10-CM | POA: Diagnosis not present

## 2017-09-22 DIAGNOSIS — E039 Hypothyroidism, unspecified: Secondary | ICD-10-CM

## 2017-09-22 DIAGNOSIS — R631 Polydipsia: Secondary | ICD-10-CM | POA: Diagnosis present

## 2017-09-22 DIAGNOSIS — Z794 Long term (current) use of insulin: Secondary | ICD-10-CM

## 2017-09-22 DIAGNOSIS — R739 Hyperglycemia, unspecified: Secondary | ICD-10-CM

## 2017-09-22 DIAGNOSIS — R350 Frequency of micturition: Secondary | ICD-10-CM | POA: Insufficient documentation

## 2017-09-22 LAB — CBC
HEMATOCRIT: 42.9 % (ref 36.0–46.0)
Hemoglobin: 14.6 g/dL (ref 12.0–15.0)
MCH: 30.2 pg (ref 26.0–34.0)
MCHC: 34 g/dL (ref 30.0–36.0)
MCV: 88.6 fL (ref 78.0–100.0)
Platelets: 265 10*3/uL (ref 150–400)
RBC: 4.84 MIL/uL (ref 3.87–5.11)
RDW: 12.9 % (ref 11.5–15.5)
WBC: 10.1 10*3/uL (ref 4.0–10.5)

## 2017-09-22 LAB — BLOOD GAS, VENOUS
ACID-BASE EXCESS: 2 mmol/L (ref 0.0–2.0)
Bicarbonate: 26.1 mmol/L (ref 20.0–28.0)
O2 Saturation: 65.7 %
PCO2 VEN: 40.6 mmHg — AB (ref 44.0–60.0)
PH VEN: 7.424 (ref 7.250–7.430)
Patient temperature: 98.6
pO2, Ven: 34.1 mmHg (ref 32.0–45.0)

## 2017-09-22 LAB — BASIC METABOLIC PANEL
Anion gap: 15 (ref 5–15)
BUN: 15 mg/dL (ref 6–20)
CHLORIDE: 95 mmol/L — AB (ref 101–111)
CO2: 23 mmol/L (ref 22–32)
Calcium: 9.3 mg/dL (ref 8.9–10.3)
Creatinine, Ser: 0.72 mg/dL (ref 0.44–1.00)
GFR calc Af Amer: >60 mL/min (ref >60)
GLUCOSE: 404 mg/dL — AB (ref 65–99)
POTASSIUM: 3.9 mmol/L (ref 3.5–5.1)
Sodium: 133 mmol/L — ABNORMAL LOW (ref 135–145)

## 2017-09-22 LAB — HEMOGLOBIN A1C
HEMOGLOBIN A1C: 10.1 % — AB (ref 4.8–5.6)
MEAN PLASMA GLUCOSE: 243.17 mg/dL

## 2017-09-22 LAB — URINALYSIS, ROUTINE W REFLEX MICROSCOPIC
Bilirubin Urine: NEGATIVE
HGB URINE DIPSTICK: NEGATIVE
Ketones, ur: 20 mg/dL — AB
LEUKOCYTES UA: NEGATIVE
Nitrite: NEGATIVE
Protein, ur: NEGATIVE mg/dL
Specific Gravity, Urine: 1.033 — ABNORMAL HIGH (ref 1.005–1.030)
pH: 6 (ref 5.0–8.0)

## 2017-09-22 LAB — CBG MONITORING, ED
GLUCOSE-CAPILLARY: 285 mg/dL — AB (ref 65–99)
Glucose-Capillary: 394 mg/dL — ABNORMAL HIGH (ref 65–99)
Glucose-Capillary: 354 mg/dL — ABNORMAL HIGH (ref 65–99)

## 2017-09-22 LAB — TSH: TSH: 10.696 u[IU]/mL — AB (ref 0.350–4.500)

## 2017-09-22 MED ORDER — SODIUM CHLORIDE 0.9 % IV BOLUS (SEPSIS)
1000.0000 mL | Freq: Once | INTRAVENOUS | Status: AC
  Administered 2017-09-22: 1000 mL via INTRAVENOUS

## 2017-09-22 MED ORDER — BASAGLAR KWIKPEN 100 UNIT/ML ~~LOC~~ SOPN: 20.0000 [IU] | PEN_INJECTOR | SUBCUTANEOUS | 11 refills | Status: DC

## 2017-09-22 MED ORDER — GLUCOSE BLOOD VI STRP: 1.0000 | ORAL_STRIP | Freq: Two times a day (BID) | 12 refills | Status: DC

## 2017-09-22 MED ORDER — LEVOTHYROXINE SODIUM 150 MCG PO TABS: 150.0000 ug | ORAL_TABLET | Freq: Every day | ORAL | 3 refills | Status: DC

## 2017-09-22 MED ORDER — INSULIN ASPART 100 UNIT/ML ~~LOC~~ SOLN
6.0000 [IU] | Freq: Once | SUBCUTANEOUS | Status: AC
  Administered 2017-09-22: 6 [IU] via INTRAVENOUS
  Filled 2017-09-22: qty 1

## 2017-09-22 NOTE — ED Provider Notes (Signed)
[facility]-EMERGENCY DEPT Provider Note   CSN: [id number] Arrival date & time: 09/22/17  7829     History   Chief Complaint Chief Complaint  Patient presents with  . Polydipsia  . Polyuria    HPI Tiffany Navarro is a 61 y.o. female.  HPI Patient presents with feeling bad.  States she has been very thirsty and has been urinating frequently.  States she has been checking her sugars at home and they have been 504 100.  States that she had previous been told she was diabetic.  States her primary care doctor started her on metformin 1000 mg 3 times a day.  States that she then had abdominal pain and muscle cramps.  States that the medicine was stopped and a hemoglobin A1c was done and she was told she did not need it anymore.  Since then sugars have been going up at times.  States he checks it at home.  States she has been feeling bad all over.  States she has urinary frequency.  Initially when I asked patient she been checking her sugars she said no but then later told me that she has been checking it frequently.  States she just wants to know that she is not crazy.  She sees Dr. Everardo All for her thyroid. Past Medical History:  Diagnosis Date  . Hypertension   . Hypothyroidism   . Osteoarthritis   . Thyroid disease     Patient Active Problem List   Diagnosis Date Noted  . Parathyroid disease (HCC) 12/23/2016  . S/P total knee replacement 02/17/2016  . Hypertriglyceridemia 05/30/2014  . Hypothyroidism 01/22/2014  . Essential hypertension, benign 01/22/2014  . Osteoarthritis of left knee 01/22/2014    Past Surgical History:  Procedure Laterality Date  . BACK SURGERY  82  . HIP PINNING Left 2012   x3   pin removed last surgery  . JOINT REPLACEMENT    . TOTAL KNEE ARTHROPLASTY Left 02/17/2016   Procedure: TOTAL KNEE ARTHROPLASTY;  Surgeon: Dannielle Huh, MD;  Location: [facility] OR;  Service: Orthopedics;  Laterality: Left;  . TOTAL KNEE ARTHROPLASTY Right  05/11/2016   Procedure: TOTAL KNEE ARTHROPLASTY;  Surgeon: Dannielle Huh, MD;  Location: [facility] OR;  Service: Orthopedics;  Laterality: Right;    OB History    No data available       Home Medications    Prior to Admission medications   Medication Sig Start Date End Date Taking? Authorizing Provider  gabapentin (NEURONTIN) 300 MG capsule Take 300 mg by mouth 2 (two) times daily.    Yes [provider]  GARLIC PO Take 5,621 mg by mouth daily.   Yes [provider]  hydrochlorothiazide (HYDRODIURIL) 25 MG tablet Take 25 mg by mouth daily.   Yes [provider]  levothyroxine (SYNTHROID, LEVOTHROID) 125 MCG tablet Take 125 mcg by mouth daily before breakfast.   Yes [provider]  Melatonin 5 MG TABS Take 5 mg by mouth at bedtime.   Yes [provider]  meloxicam (MOBIC) 15 MG tablet Take 15 mg by mouth daily as needed for pain.    Yes [provider]  pantoprazole (PROTONIX) 40 MG tablet Take 40 mg by mouth daily.   Yes [provider]  sucralfate (CARAFATE) 1 g tablet Take 1 g by mouth 2 (two) times daily.   Yes [provider]  traMADol (ULTRAM) 50 MG tablet Take 50 mg by mouth 2 (two) times daily.    Yes  [provider]    Family History Family History  Problem Relation Age of Onset  . Hypertension Mother   . Hypertension Father   . Stroke Father   . Hypertension Brother   . Thyroid disease Neg Hx     Social History Social History  Substance Use Topics  . Smoking status: Current Some Day Smoker    Packs/day: 0.25    Years: 40.00    Types: Cigarettes  . Smokeless tobacco: Never Used  . Alcohol use No     Allergies   Patient has no known allergies.   Review of Systems Review of Systems  Constitutional: Positive for appetite change and fatigue.  HENT: Negative for congestion.   Respiratory: Negative for shortness of breath.   Cardiovascular: Negative for chest pain.  Gastrointestinal:  Positive for abdominal pain.  Endocrine: Positive for polydipsia, polyphagia and polyuria.  Genitourinary: Positive for flank pain.  Musculoskeletal: Negative for back pain.  Neurological: Negative for tremors and numbness.  Psychiatric/Behavioral: Negative for confusion.     Physical Exam Updated Vital Signs BP (!) 132/94   Pulse 64   Temp 97.9 F (36.6 C) (Oral)   Resp 16   SpO2 98%   Physical Exam  Constitutional: She appears well-developed.  HENT:  Head: Atraumatic.  Eyes: Pupils are equal, round, and reactive to light.  Neck: Neck supple.  Cardiovascular: Normal rate.   Pulmonary/Chest: Effort normal.  Abdominal: Soft. There is no tenderness.  Musculoskeletal: She exhibits no edema.  Neurological: She is alert.  Skin: Skin is warm. Capillary refill takes less than 2 seconds.  Psychiatric: She has a normal mood and affect.     ED Treatments / Results  Labs (all labs ordered are listed, but only abnormal results are displayed) Labs Reviewed  BASIC METABOLIC PANEL - Abnormal; Notable for the following:       Result Value   Sodium 133 (*)    Chloride 95 (*)    Glucose, Bld 404 (*)    All other components within normal limits  URINALYSIS, ROUTINE W REFLEX MICROSCOPIC - Abnormal; Notable for the following:    Specific Gravity, Urine 1.033 (*)    Glucose, UA >=500 (*)    Ketones, ur 20 (*)    Bacteria, UA RARE (*)    Squamous Epithelial / LPF 0-5 (*)    All other components within normal limits  TSH - Abnormal; Notable for the following:    TSH 10.696 (*)    All other components within normal limits  HEMOGLOBIN A1C - Abnormal; Notable for the following:    Hgb A1c MFr Bld 10.1 (*)    All other components within normal limits  BLOOD GAS, VENOUS - Abnormal; Notable for the following:    pCO2, Ven 40.6 (*)    All other components within normal limits  CBG MONITORING, ED - Abnormal; Notable for the following:    Glucose-Capillary 394 (*)    All other components  within normal limits  CBG MONITORING, ED - Abnormal; Notable for the following:    Glucose-Capillary 354 (*)    All other components within normal limits  CBG MONITORING, ED - Abnormal; Notable for the following:    Glucose-Capillary 285 (*)    All other components within normal limits  CBC  CBG MONITORING, ED    EKG  EKG Interpretation None       Radiology No results found.  Procedures Procedures (including critical care time)  Medications Ordered in ED Medications  sodium chloride 0.9 % bolus 1,000 mL (0 mLs Intravenous Stopped 09/22/17 1145)  insulin aspart (novoLOG) injection 6 Units (6 Units Intravenous Given 09/22/17 1155)  sodium chloride 0.9 % bolus 1,000 mL (1,000 mLs Intravenous New Bag/Given 09/22/17 1155)     Initial Impression / Assessment and Plan / ED Course  I have reviewed the triage vital signs and the nursing notes.  Pertinent labs & imaging results that were available during my care of the patient were reviewed by me and considered in my medical decision making (see chart for details).     Patient with hyperglycemia.  Has been told she is diabetic in the past but not on medicines.  Has hyperglycemia without DKA.  Also hypothyroidism.  Discussed with Dr. Everardo All, who is considered a patient at 2:00.  Will discharge. Sugars improved  Final Clinical Impressions(s) / ED Diagnoses   Final diagnoses:  Hyperglycemia  Hypothyroidism, unspecified type    New Prescriptions New Prescriptions   No medications on file     Benjiman Core, MD 09/22/17 1317

## 2017-09-22 NOTE — Discharge Instructions (Signed)
Go to Dr. George HughEllison's office for the 2:00 appointment.

## 2017-09-22 NOTE — Patient Instructions (Addendum)
good diet and exercise significantly improve the control of your diabetes.  please let me know if you wish to be referred to a dietician.  high blood sugar is very risky to your health.  you should see an eye doctor and dentist every year.  It is very important to get all recommended vaccinations.  Controlling your blood pressure and cholesterol drastically reduces the damage diabetes does to your body.  Those who smoke should quit.  Please discuss these with your doctor.  check your blood sugar twice a day.  vary the time of day when you check, between before the 3 meals, and at bedtime.  also check if you have symptoms of your blood sugar being too high or too low.  please keep a record of the readings and bring it to your next appointment here (or you can bring the meter itself).  You can write it on any piece of paper.  please call us sooner if your blood sugar goes below 70, or if you have a lot of readings over 200. Here is a new meter.  I have sent prescriptions to your pharmacy, for strips, insulin, and to increase the thyroid pill. Please call or message us next week, to tell us how the blood sugar is doing.   Please come back for a follow-up appointment in 1 month.

## 2017-09-22 NOTE — Telephone Encounter (Signed)
Patient stated that pharmacy haven't received prescriptions levothyroxine, and diabetes medication. Please advise

## 2017-09-22 NOTE — Progress Notes (Signed)
Subjective:    Patient ID: Tiffany FleetingLaurie E Gad, female    DOB: 04/28/1956, 61 y.o.   MRN: [id number]  HPI pt is referred by Dr Rubin PayorPickering, for diabetes.  Pt states DM was dx'ed in mid-2018; she has mild if any neuropathy of the lower extremities; she is unaware of any associated chronic complications; she has never been on insulin; pt says her diet and exercise are good; she has never had GDM, pancreatitis, pancreatic surgery, severe hypoglycemia or DKA.  She was rx'ed metformin, but she did not tolerate (GI sxs).  She takes synthroid as rx'ed.  Past Medical History:  Diagnosis Date  . Hypertension   . Hypothyroidism   . Osteoarthritis   . Thyroid disease     Past Surgical History:  Procedure Laterality Date  . BACK SURGERY  82  . HIP PINNING Left 2012   x3   pin removed last surgery  . JOINT REPLACEMENT    . TOTAL KNEE ARTHROPLASTY Left 02/17/2016   Procedure: TOTAL KNEE ARTHROPLASTY;  Surgeon: Dannielle HuhSteve Lucey, MD;  Location: [facility] OR;  Service: Orthopedics;  Laterality: Left;  . TOTAL KNEE ARTHROPLASTY Right 05/11/2016   Procedure: TOTAL KNEE ARTHROPLASTY;  Surgeon: Dannielle HuhSteve Lucey, MD;  Location: [facility] OR;  Service: Orthopedics;  Laterality: Right;    Social History   Social History  . Marital status: Widowed    Spouse name: N/A  . Number of children: N/A  . Years of education: N/A   Occupational History  . Not on file.   Social History Main Topics  . Smoking status: Current Some Day Smoker    Packs/day: 0.25    Years: 40.00    Types: Cigarettes  . Smokeless tobacco: Never Used  . Alcohol use No  . Drug use: No     Comment: 2 cig /day qod  . Sexual activity: Not on file   Other Topics Concern  . Not on file   Social History Narrative  . No narrative on file    Current Outpatient Prescriptions on File Prior to Visit  Medication Sig Dispense Refill  . gabapentin (NEURONTIN) 300 MG capsule Take 300 mg by mouth 2 (two) times daily.     Marland Kitchen. GARLIC PO Take 7,2531,000 mg by mouth daily.     . hydrochlorothiazide (HYDRODIURIL) 25 MG tablet Take 25 mg by mouth daily.    . Melatonin 5 MG TABS Take 5 mg by mouth at bedtime.    . meloxicam (MOBIC) 15 MG tablet Take 15 mg by mouth daily as needed for pain.     . pantoprazole (PROTONIX) 40 MG tablet Take 40 mg by mouth daily.    . sucralfate (CARAFATE) 1 g tablet Take 1 g by mouth 2 (two) times daily.    . traMADol (ULTRAM) 50 MG tablet Take 50 mg by mouth 2 (two) times daily.      No current facility-administered medications on file prior to visit.     No Known Allergies  Family History  Problem Relation Age of Onset  . Hypertension Mother   . Diabetes Mother   . Hypertension Father   . Stroke Father   . Diabetes Father   . Hypertension Brother   . Diabetes Brother   . Thyroid disease Neg Hx     BP (!) 144/92   Pulse 62   Wt 180 lb 6.4 oz (81.8 kg)   SpO2 97%   BMI 30.02 kg/m   Review of Systems She has weight gain and fatigue.  Objective:   Physical Exam VITAL SIGNS:  See vs page GENERAL: no distress Pulses: foot pulses are intact bilaterally.   MSK: no deformity of the feet or ankles.  CV: no edema of the legs or ankles.  Skin:  no ulcer on the feet or ankles.  normal color and temp on the feet and ankles.   Neuro: sensation is intact to touch on the feet and ankles.    UA pos for ketones.    I have reviewed outside records, and summarized: Pt was noted to have severely elevated a1c, and referred here.  She was seen in ER, with severe hyperglycemia. Also, hypothyroidism was not well-controlled.   Lab Results  Component Value Date   HGBA1C 10.1 (H) 09/22/2017   I personally reviewed electrocardiogram tracing (06/08/16): Indication: back pain.   Impression: NSR.  No MI.  RAE.  Compared to 02/17/16: RAE is new.     Assessment & Plan:  Insulin-requiring type 2 DM: severe exacerbation.   Ketonuria, new to me: she needs to start insulin.    Patient Instructions  good diet and exercise  significantly improve the control of your diabetes.  please let me know if you wish to be referred to a dietician.  high blood sugar is very risky to your health.  you should see an eye doctor and dentist every year.  It is very important to get all recommended vaccinations.  Controlling your blood pressure and cholesterol drastically reduces the damage diabetes does to your body.  Those who smoke should quit.  Please discuss these with your doctor.  check your blood sugar twice a day.  vary the time of day when you check, between before the 3 meals, and at bedtime.  also check if you have symptoms of your blood sugar being too high or too low.  please keep a record of the readings and bring it to your next appointment here (or you can bring the meter itself).  You can write it on any piece of paper.  please call us sooner if your blood sugar goes below 70, or if you have a lot of readings over 200. Here is a new meter.  I have sent prescriptions to your pharmacy, for strips, insulin, and to increase the thyroid pill. Please call or message [place] next week, to tell us how the blood sugar is doing.   Please come back for a follow-up appointment in 1 month.

## 2017-09-22 NOTE — Telephone Encounter (Signed)
Called and prescriptions had went to wrong pharmacy so I sent them in to correct one.

## 2017-09-22 NOTE — ED Triage Notes (Addendum)
Pt verbalizing excessive thirst and urinary frequency for past few days; hx of treatment for hyperglycemia; does not take medication for same.  EDIT: Post triage pt verbalizes taking blood sugar at home reading HIGH.

## 2017-09-23 ENCOUNTER — Ambulatory Visit: Payer: BLUE CROSS/BLUE SHIELD | Admitting: Endocrinology

## 2017-09-30 NOTE — Telephone Encounter (Signed)
Patient stated her b/ 477 last with eating a good dinner, doing what she is suposed to do.Mariella Saa. Basaglar is not working please advise

## 2017-09-30 NOTE — Telephone Encounter (Signed)
Patient notified of message and voiced understanding. She will call back on 10/04/2017 to report blood sugar readings.

## 2017-09-30 NOTE — Telephone Encounter (Signed)
Please increase the insulin to 30 units qam Please call or message us next week, to tell us how the blood sugar is doing 

## 2017-10-07 NOTE — Telephone Encounter (Signed)
Called & left patient VM stating to increase insulin to 40 units qam. If she had any further questions I stated she could give us a call back.

## 2017-10-07 NOTE — Telephone Encounter (Signed)
Patient called back & she is going to try the increase of the 40 units in the morning to see if it brings blood sugars down to better than in 200's..Marland Kitchen

## 2017-10-07 NOTE — Telephone Encounter (Signed)
Patient stated that her b/s are in the 200dreds and is doing better

## 2017-10-07 NOTE — Telephone Encounter (Signed)
Please increase the insulin to 40 units qam. I'll see you next time.

## 2017-10-15 ENCOUNTER — Telehealth: Payer: Self-pay | Admitting: Endocrinology

## 2017-10-15 ENCOUNTER — Other Ambulatory Visit: Payer: Self-pay

## 2017-10-15 ENCOUNTER — Telehealth: Payer: Self-pay

## 2017-10-15 NOTE — Telephone Encounter (Signed)
Ok, please increase to 50 units qam.   I'll see you next time.

## 2017-10-15 NOTE — Telephone Encounter (Signed)
Patient reporting her levels are still running a little high. Last night it was 377. This morning it was

## 2017-10-15 NOTE — Telephone Encounter (Signed)
Blood sugar is still running high - last night it was 377 and this morning it was 162 before breakfast- patient wants to know if there is anything she should be doing to help this please advise

## 2017-10-15 NOTE — Telephone Encounter (Signed)
Called and informed patient of insulin dosage change. She stated that she would call to let us know if her blood sugars returned to normal.

## 2017-10-18 ENCOUNTER — Other Ambulatory Visit: Payer: Self-pay

## 2017-10-18 MED ORDER — GLUCOSE BLOOD VI STRP: 1.0000 | ORAL_STRIP | Freq: Four times a day (QID) | 12 refills | Status: DC

## 2017-10-18 MED ORDER — BASAGLAR KWIKPEN 100 UNIT/ML ~~LOC~~ SOPN: 50.0000 [IU] | PEN_INJECTOR | SUBCUTANEOUS | 11 refills | Status: DC

## 2017-10-20 ENCOUNTER — Other Ambulatory Visit: Payer: Self-pay | Admitting: Adult Health

## 2017-10-20 DIAGNOSIS — Z139 Encounter for screening, unspecified: Secondary | ICD-10-CM

## 2017-11-01 ENCOUNTER — Ambulatory Visit (INDEPENDENT_AMBULATORY_CARE_PROVIDER_SITE_OTHER): Payer: BLUE CROSS/BLUE SHIELD | Admitting: Endocrinology

## 2017-11-01 ENCOUNTER — Encounter: Payer: Self-pay | Admitting: Endocrinology

## 2017-11-01 VITALS — BP 138/74 | HR 120 | Wt 179.8 lb

## 2017-11-01 DIAGNOSIS — E215 Disorder of parathyroid gland, unspecified: Secondary | ICD-10-CM | POA: Diagnosis not present

## 2017-11-01 DIAGNOSIS — E039 Hypothyroidism, unspecified: Secondary | ICD-10-CM | POA: Diagnosis not present

## 2017-11-01 DIAGNOSIS — E119 Type 2 diabetes mellitus without complications: Secondary | ICD-10-CM | POA: Diagnosis not present

## 2017-11-01 DIAGNOSIS — Z794 Long term (current) use of insulin: Secondary | ICD-10-CM | POA: Diagnosis not present

## 2017-11-01 LAB — TSH: TSH: 0.48 u[IU]/mL (ref 0.35–4.50)

## 2017-11-01 LAB — VITAMIN D 25 HYDROXY (VIT D DEFICIENCY, FRACTURES): VITD: 15.21 ng/mL — ABNORMAL LOW (ref 30.00–100.00)

## 2017-11-01 NOTE — Patient Instructions (Addendum)
check your blood sugar twice a day.  vary the time of day when you check, between before the 3 meals, and at bedtime.  also check if you have symptoms of your blood sugar being too high or too low.  please keep a record of the readings and bring it to your next appointment here (or you can bring the meter itself).  You can write it on any piece of paper.  please call us sooner if your blood sugar goes below 70, or if you have a lot of readings over 200. blood tests are requested for you today.  We'll let you know about the results. Please come back for a follow-up appointment in 3 months.    

## 2017-11-01 NOTE — Progress Notes (Signed)
Subjective:    Patient ID: Tiffany Navarro, female    DOB: 06/18/1956, 61 y.o.   MRN: [id number]  HPI Pt returns for f/u of diabetes mellitus: DM type: 1 Dx'ed: 2018 Complications: none Therapy: insulin since soon after dx GDM: never DKA: never Severe hypoglycemia: never Pancreatitis: never Pancreatic imaging:  Other: type 1 DM is dx'ed when pt presented with severe hyperglycemia and ketonuria; she declines multiple daily injections; she did not tolerate metformin (GI sxs) Interval history: no cbg record, but states cbg's vary from 122-187.  It is in general higher as the day goes on. She takes synthroid as rx'ed.  Pt also has vit-D deficiency   Past Medical History:  Diagnosis Date  . Hypertension   . Hypothyroidism   . Osteoarthritis   . Thyroid disease     Past Surgical History:  Procedure Laterality Date  . BACK SURGERY  82  . HIP PINNING Left 2012   x3   pin removed last surgery  . JOINT REPLACEMENT    . TOTAL KNEE ARTHROPLASTY Left 02/17/2016   Procedure: TOTAL KNEE ARTHROPLASTY;  Surgeon: Dannielle HuhSteve Lucey, MD;  Location: [facility] OR;  Service: Orthopedics;  Laterality: Left;  . TOTAL KNEE ARTHROPLASTY Right 05/11/2016   Procedure: TOTAL KNEE ARTHROPLASTY;  Surgeon: Dannielle HuhSteve Lucey, MD;  Location: [facility] OR;  Service: Orthopedics;  Laterality: Right;    Social History   Socioeconomic History  . Marital status: Widowed    Spouse name: Not on file  . Number of children: Not on file  . Years of education: Not on file  . Highest education level: Not on file  Social Needs  . Financial resource strain: Not on file  . Food insecurity - worry: Not on file  . Food insecurity - inability: Not on file  . Transportation needs - medical: Not on file  . Transportation needs - non-medical: Not on file  Occupational History  . Not on file  Tobacco Use  . Smoking status: Current Some Day Smoker    Packs/day: 0.25    Years: 40.00    Pack years: 10.00    Types: Cigarettes  . Smokeless  tobacco: Never Used  Substance and Sexual Activity  . Alcohol use: No  . Drug use: No    Comment: 2 cig /day qod  . Sexual activity: Not on file  Other Topics Concern  . Not on file  Social History Narrative  . Not on file    Current Outpatient Medications on File Prior to Visit  Medication Sig Dispense Refill  . gabapentin (NEURONTIN) 300 MG capsule Take 300 mg by mouth 2 (two) times daily.     Marland Kitchen. GARLIC PO Take 4,0981,000 mg by mouth daily.    Marland Kitchen. glucose blood (CONTOUR NEXT TEST) test strip 1 each 4 (four) times daily by Other route. And lancets 2/day 100 each 12  . hydrochlorothiazide (HYDRODIURIL) 25 MG tablet Take 25 mg by mouth daily.    . Insulin Glargine (BASAGLAR KWIKPEN) 100 UNIT/ML SOPN Inject 0.5 mLs (50 Units total) every morning into the skin. And pen needles 1/day 5 pen 11  . levothyroxine (SYNTHROID, LEVOTHROID) 150 MCG tablet Take 1 tablet (150 mcg total) by mouth daily. 90 tablet 3  . Melatonin 5 MG TABS Take 5 mg by mouth at bedtime.    . meloxicam (MOBIC) 15 MG tablet Take 15 mg by mouth daily as needed for pain.     . pantoprazole (PROTONIX) 40 MG tablet Take 40 mg by  mouth daily.    . sucralfate (CARAFATE) 1 g tablet Take 1 g by mouth 2 (two) times daily.    . traMADol (ULTRAM) 50 MG tablet Take 50 mg by mouth 2 (two) times daily.      No current facility-administered medications on file prior to visit.     No Known Allergies  Family History  Problem Relation Age of Onset  . Hypertension Mother   . Diabetes Mother   . Hypertension Father   . Stroke Father   . Diabetes Father   . Hypertension Brother   . Diabetes Brother   . Thyroid disease Neg Hx     BP 138/74 (BP Location: Left Arm, Patient Position: Sitting, Cuff Size: Normal)   Pulse (!) 120   Wt 179 lb 12.8 oz (81.6 kg)   SpO2 95%   BMI 29.92 kg/m   Review of Systems She denies hypoglycemia.      Objective:   Physical Exam VITAL SIGNS:  See vs page GENERAL: no distress Pulses: foot pulses  are intact bilaterally.   MSK: no deformity of the feet or ankles.  CV: no edema of the legs or ankles Skin:  no ulcer on the feet or ankles.  normal color and temp on the feet and ankles.   Neuro: sensation is intact to touch on the feet and ankles.        Assessment & Plan:  Type 1 DM: uncertain control.   Hypothyroidism: due for recheck.  Vit-D deficiency: due for recheck.    Patient Instructions  check your blood sugar twice a day.  vary the time of day when you check, between before the 3 meals, and at bedtime.  also check if you have symptoms of your blood sugar being too high or too low.  please keep a record of the readings and bring it to your next appointment here (or you can bring the meter itself).  You can write it on any piece of paper.  please call us sooner if your blood sugar goes below 70, or if you have a lot of readings over 200. blood tests are requested for you today.  We'll let you know about the results. Please come back for a follow-up appointment in 3 months.

## 2017-11-02 ENCOUNTER — Other Ambulatory Visit: Payer: Self-pay | Admitting: Endocrinology

## 2017-11-02 MED ORDER — ERGOCALCIFEROL 1.25 MG (50000 UT) PO CAPS: 50000.0000 [IU] | ORAL_CAPSULE | ORAL | 0 refills | Status: DC

## 2017-11-03 LAB — PTH, INTACT AND CALCIUM
Calcium: 9.5 mg/dL (ref 8.6–10.4)
PTH: 33 pg/mL (ref 14–64)

## 2017-11-03 LAB — FRUCTOSAMINE: FRUCTOSAMINE: 266 umol/L (ref 190–270)

## 2017-11-24 ENCOUNTER — Other Ambulatory Visit: Payer: Self-pay

## 2017-11-24 MED ORDER — ERGOCALCIFEROL 1.25 MG (50000 UT) PO CAPS: 50000.0000 [IU] | ORAL_CAPSULE | ORAL | 0 refills | Status: DC

## 2017-12-21 ENCOUNTER — Encounter: Payer: Self-pay | Admitting: Adult Health

## 2018-01-31 ENCOUNTER — Ambulatory Visit: Payer: BLUE CROSS/BLUE SHIELD | Admitting: Endocrinology

## 2018-02-04 ENCOUNTER — Ambulatory Visit: Payer: BLUE CROSS/BLUE SHIELD | Admitting: Endocrinology

## 2018-02-13 ENCOUNTER — Other Ambulatory Visit: Payer: Self-pay | Admitting: Endocrinology

## 2018-02-16 ENCOUNTER — Ambulatory Visit (INDEPENDENT_AMBULATORY_CARE_PROVIDER_SITE_OTHER): Payer: BLUE CROSS/BLUE SHIELD | Admitting: Endocrinology

## 2018-02-16 ENCOUNTER — Encounter: Payer: Self-pay | Admitting: Endocrinology

## 2018-02-16 VITALS — BP 122/84 | HR 84 | Ht 65.0 in | Wt 187.0 lb

## 2018-02-16 DIAGNOSIS — Z794 Long term (current) use of insulin: Secondary | ICD-10-CM | POA: Diagnosis not present

## 2018-02-16 DIAGNOSIS — E215 Disorder of parathyroid gland, unspecified: Secondary | ICD-10-CM

## 2018-02-16 DIAGNOSIS — R200 Anesthesia of skin: Secondary | ICD-10-CM | POA: Diagnosis not present

## 2018-02-16 DIAGNOSIS — E119 Type 2 diabetes mellitus without complications: Secondary | ICD-10-CM | POA: Diagnosis not present

## 2018-02-16 LAB — CBC WITH DIFFERENTIAL/PLATELET
Basophils Absolute: 0.1 10*3/uL (ref 0.0–0.1)
Basophils Relative: 0.7 % (ref 0.0–3.0)
EOS PCT: 2.7 % (ref 0.0–5.0)
Eosinophils Absolute: 0.3 10*3/uL (ref 0.0–0.7)
HCT: 42.4 % (ref 36.0–46.0)
HEMOGLOBIN: 14.4 g/dL (ref 12.0–15.0)
Lymphocytes Relative: 24.7 % (ref 12.0–46.0)
Lymphs Abs: 3.2 10*3/uL (ref 0.7–4.0)
MCHC: 33.9 g/dL (ref 30.0–36.0)
MCV: 89.3 fl (ref 78.0–100.0)
MONO ABS: 0.8 10*3/uL (ref 0.1–1.0)
Monocytes Relative: 6.2 % (ref 3.0–12.0)
Neutro Abs: 8.5 10*3/uL — ABNORMAL HIGH (ref 1.4–7.7)
Neutrophils Relative %: 65.7 % (ref 43.0–77.0)
Platelets: 326 10*3/uL (ref 150.0–400.0)
RBC: 4.75 Mil/uL (ref 3.87–5.11)
RDW: 13.5 % (ref 11.5–15.5)
WBC: 13 10*3/uL — AB (ref 4.0–10.5)

## 2018-02-16 LAB — HEMOGLOBIN A1C: Hgb A1c MFr Bld: 7.5 % — ABNORMAL HIGH (ref 4.6–6.5)

## 2018-02-16 LAB — VITAMIN D 25 HYDROXY (VIT D DEFICIENCY, FRACTURES): VITD: 27.51 ng/mL — AB (ref 30.00–100.00)

## 2018-02-16 LAB — VITAMIN B12: Vitamin B-12: 455 pg/mL (ref 211–911)

## 2018-02-16 NOTE — Progress Notes (Signed)
Subjective:    Patient ID: Tiffany FleetingLaurie E Benedict, female    DOB: 11/03/1956, 62 y.o.   MRN: [id number]  HPI Pt returns for f/u of diabetes mellitus: DM type: 1 Dx'ed: 2018 Complications: none Therapy: insulin since soon after dx GDM: never DKA: never Severe hypoglycemia: never.  Pancreatitis: never Pancreatic imaging:  Other: type 1 DM was dx'ed when pt presented with severe hyperglycemia and ketonuria; she declines multiple daily injections; she did not tolerate metformin (GI sxs) Interval history: no cbg record, but states cbg's vary from 108-149.  It is in general higher as the day goes on.  She took steroids x 2 weeks, 1 month ago, for back pain.   She takes synthroid as rx'ed.   Pt also has vit-D deficiency.  She does not take any supplement now.   Past Medical History:  Diagnosis Date  . Hypertension   . Hypothyroidism   . Osteoarthritis   . Thyroid disease     Past Surgical History:  Procedure Laterality Date  . BACK SURGERY  82  . HIP PINNING Left 2012   x3   pin removed last surgery  . JOINT REPLACEMENT    . TOTAL KNEE ARTHROPLASTY Left 02/17/2016   Procedure: TOTAL KNEE ARTHROPLASTY;  Surgeon: Dannielle HuhSteve Lucey, MD;  Location: [facility];  Service: Orthopedics;  Laterality: Left;  . TOTAL KNEE ARTHROPLASTY Right 05/11/2016   Procedure: TOTAL KNEE ARTHROPLASTY;  Surgeon: Dannielle HuhSteve Lucey, MD;  Location: [facility] OR;  Service: Orthopedics;  Laterality: Right;    Social History   Socioeconomic History  . Marital status: Widowed    Spouse name: Not on file  . Number of children: Not on file  . Years of education: Not on file  . Highest education level: Not on file  Occupational History  . Not on file  Social Needs  . Financial resource strain: Not on file  . Food insecurity:    Worry: Not on file    Inability: Not on file  . Transportation needs:    Medical: Not on file    Non-medical: Not on file  Tobacco Use  . Smoking status: Current Some Day Smoker    Packs/day: 0.25   Years: 40.00    Pack years: 10.00    Types: Cigarettes  . Smokeless tobacco: Never Used  Substance and Sexual Activity  . Alcohol use: No  . Drug use: No    Comment: 2 cig /day qod  . Sexual activity: Not on file  Lifestyle  . Physical activity:    Days per week: Not on file    Minutes per session: Not on file  . Stress: Not on file  Relationships  . Social connections:    Talks on phone: Not on file    Gets together: Not on file    Attends religious service: Not on file    Active member of club or organization: Not on file    Attends meetings of clubs or organizations: Not on file    Relationship status: Not on file  . Intimate partner violence:    Fear of current or ex partner: Not on file    Emotionally abused: Not on file    Physically abused: Not on file    Forced sexual activity: Not on file  Other Topics Concern  . Not on file  Social History Narrative  . Not on file    Current Outpatient Medications on File Prior to Visit  Medication Sig Dispense Refill  . gabapentin (NEURONTIN)  300 MG capsule Take 300 mg by mouth 2 (two) times daily.     Marland Kitchen GARLIC PO Take 1,610 mg by mouth daily.    Marland Kitchen glucose blood (CONTOUR NEXT TEST) test strip 1 each 4 (four) times daily by Other route. And lancets 2/day 100 each 12  . hydrochlorothiazide (HYDRODIURIL) 25 MG tablet Take 25 mg by mouth daily.    . Insulin Glargine (BASAGLAR KWIKPEN) 100 UNIT/ML SOPN Inject 0.5 mLs (50 Units total) every morning into the skin. And pen needles 1/day 5 pen 11  . levothyroxine (SYNTHROID, LEVOTHROID) 150 MCG tablet Take 1 tablet (150 mcg total) by mouth daily. 90 tablet 3  . Melatonin 5 MG TABS Take 5 mg by mouth at bedtime.    . meloxicam (MOBIC) 15 MG tablet Take 15 mg by mouth daily as needed for pain.     . pantoprazole (PROTONIX) 40 MG tablet Take 40 mg by mouth daily.    . sucralfate (CARAFATE) 1 g tablet Take 1 g by mouth 2 (two) times daily.    . traMADol (ULTRAM) 50 MG tablet Take 50 mg by  mouth 2 (two) times daily.     . Vitamin D, Ergocalciferol, (DRISDOL) 50000 units CAPS capsule TAKE 1 CAPSULE (50,000 UNITS TOTAL) BY MOUTH 3 (THREE) TIMES A WEEK. 36 capsule 0   No current facility-administered medications on file prior to visit.     No Known Allergies  Family History  Problem Relation Age of Onset  . Hypertension Mother   . Diabetes Mother   . Hypertension Father   . Stroke Father   . Diabetes Father   . Hypertension Brother   . Diabetes Brother   . Thyroid disease Neg Hx     BP 122/84   Pulse 84   Ht 5\' 5"  (1.651 m)   Wt 187 lb (84.8 kg)   SpO2 97%   BMI 31.12 kg/m    Review of Systems She denies hypoglycemia.      Objective:   Physical Exam VITAL SIGNS:  See vs page GENERAL: no distress Pulses: dorsalis pedis intact bilat.   MSK: no deformity of the feet CV: no leg edema Skin:  no ulcer on the feet.  normal color and temp on the feet. Neuro: sensation is intact to touch on the feet     Lab Results  Component Value Date   HGBA1C 7.5 (H) 02/16/2018   Lab Results  Component Value Date   CREATININE 0.70 02/16/2018   BUN 15 02/16/2018   NA 138 02/16/2018   K 4.2 02/16/2018   CL 102 02/16/2018   CO2 25 02/16/2018      Assessment & Plan:  Type 1 DM: this is the best control this pt should aim for, given this regimen, which does match insulin to her changing needs throughout the day Vit-D deficiency: due for recheck.  Patient Instructions  check your blood sugar twice a day.  vary the time of day when you check, between before the 3 meals, and at bedtime.  also check if you have symptoms of your blood sugar being too high or too low.  please keep a record of the readings and bring it to your next appointment here (or you can bring the meter itself).  You can write it on any piece of paper.  please call us sooner if your blood sugar goes below 70, or if you have a lot of readings over 200. blood tests are requested for you today.  We'll let  you know about the results. Please come back for a follow-up appointment in 4 months.

## 2018-02-16 NOTE — Patient Instructions (Addendum)
check your blood sugar twice a day.  vary the time of day when you check, between before the 3 meals, and at bedtime.  also check if you have symptoms of your blood sugar being too high or too low.  please keep a record of the readings and bring it to your next appointment here (or you can bring the meter itself).  You can write it on any piece of paper.  please call us sooner if your blood sugar goes below 70, or if you have a lot of readings over 200. blood tests are requested for you today.  We'll let you know about the results.  Please come back for a follow-up appointment in 4 months.   

## 2018-02-17 LAB — BASIC METABOLIC PANEL
BUN: 15 mg/dL (ref 6–23)
CO2: 25 mEq/L (ref 19–32)
Calcium: 9.8 mg/dL (ref 8.4–10.5)
Chloride: 102 mEq/L (ref 96–112)
Creatinine, Ser: 0.7 mg/dL (ref 0.40–1.20)
GFR: 90.21 mL/min (ref >60.00)
GLUCOSE: 164 mg/dL — AB (ref 70–99)
POTASSIUM: 4.2 meq/L (ref 3.5–5.1)
SODIUM: 138 meq/L (ref 135–145)

## 2018-02-17 LAB — HEPATIC FUNCTION PANEL
ALBUMIN: 4.4 g/dL (ref 3.5–5.2)
ALT: 27 U/L (ref 0–35)
AST: 19 U/L (ref 0–37)
Alkaline Phosphatase: 66 U/L (ref 39–117)
Bilirubin, Direct: 0 mg/dL (ref 0.0–0.3)
TOTAL PROTEIN: 7.1 g/dL (ref 6.0–8.3)

## 2018-02-21 ENCOUNTER — Other Ambulatory Visit: Payer: Self-pay | Admitting: Adult Health

## 2018-02-21 DIAGNOSIS — R200 Anesthesia of skin: Secondary | ICD-10-CM

## 2018-02-21 DIAGNOSIS — R202 Paresthesia of skin: Principal | ICD-10-CM

## 2018-02-21 DIAGNOSIS — M545 Low back pain, unspecified: Secondary | ICD-10-CM

## 2018-03-01 ENCOUNTER — Ambulatory Visit
Admission: RE | Admit: 2018-03-01 | Discharge: 2018-03-01 | Disposition: A | Discharge status: Home / Self Care | Payer: BLUE CROSS/BLUE SHIELD | Source: Ambulatory Visit | Attending: Adult Health | Admitting: Adult Health

## 2018-03-01 DIAGNOSIS — R202 Paresthesia of skin: Principal | ICD-10-CM

## 2018-03-01 DIAGNOSIS — R200 Anesthesia of skin: Secondary | ICD-10-CM

## 2018-03-01 DIAGNOSIS — M545 Low back pain, unspecified: Secondary | ICD-10-CM

## 2018-05-12 ENCOUNTER — Other Ambulatory Visit: Payer: Self-pay | Admitting: Endocrinology

## 2018-05-24 ENCOUNTER — Ambulatory Visit: Payer: BLUE CROSS/BLUE SHIELD | Admitting: Endocrinology

## 2018-06-17 ENCOUNTER — Ambulatory Visit: Payer: BLUE CROSS/BLUE SHIELD | Admitting: Endocrinology

## 2018-06-17 ENCOUNTER — Encounter: Payer: Self-pay | Admitting: Endocrinology

## 2018-06-17 VITALS — BP 124/70 | HR 89 | Ht 65.0 in | Wt 186.8 lb

## 2018-06-17 DIAGNOSIS — E119 Type 2 diabetes mellitus without complications: Secondary | ICD-10-CM | POA: Diagnosis not present

## 2018-06-17 DIAGNOSIS — Z794 Long term (current) use of insulin: Secondary | ICD-10-CM | POA: Diagnosis not present

## 2018-06-17 LAB — POCT GLYCOSYLATED HEMOGLOBIN (HGB A1C): Hemoglobin A1C: 7.1 % — AB (ref 4.0–5.6)

## 2018-06-17 NOTE — Patient Instructions (Addendum)
check your blood sugar twice a day.  vary the time of day when you check, between before the 3 meals, and at bedtime.  also check if you have symptoms of your blood sugar being too high or too low.  please keep a record of the readings and bring it to your next appointment here (or you can bring the meter itself).  You can write it on any piece of paper.  please call us sooner if your blood sugar goes below 70, or if you have a lot of readings over 200.   Please continue the same insulin.   Please come back for a follow-up appointment in 4-6 months.

## 2018-06-17 NOTE — Progress Notes (Signed)
Subjective:    Patient ID: Tiffany Navarro, female    DOB: [birth year], 62 y.o.   MRN: [id number]  HPI Pt returns for f/u of diabetes mellitus: DM type: 1 Dx'ed: 2018 Complications: none Therapy: insulin since soon after dx GDM: never DKA: never Severe hypoglycemia: never.  Pancreatitis: never Pancreatic imaging:  Other: type 1 DM was dx'ed when pt presented with severe hyperglycemia and ketonuria; she declines multiple daily injections; she did not tolerate metformin (GI sxs).   Interval history: no cbg record, but states cbg's are well-controlled.  No recent steroids.  pt states she feels well in general, except for arthralgias.  She denies hypoglycemia  She takes synthroid as rx'ed.   Past Medical History:  Diagnosis Date  . Hypertension   . Hypothyroidism   . Osteoarthritis   . Thyroid disease     Past Surgical History:  Procedure Laterality Date  . BACK SURGERY  82  . HIP PINNING Left 2012   x3   pin removed last surgery  . JOINT REPLACEMENT    . TOTAL KNEE ARTHROPLASTY Left 02/17/2016   Procedure: TOTAL KNEE ARTHROPLASTY;  Surgeon: Dannielle Huh, MD;  Location: [facility] OR;  Service: Orthopedics;  Laterality: Left;  . TOTAL KNEE ARTHROPLASTY Right 05/11/2016   Procedure: TOTAL KNEE ARTHROPLASTY;  Surgeon: Dannielle Huh, MD;  Location: [facility];  Service: Orthopedics;  Laterality: Right;    Social History   Socioeconomic History  . Marital status: Widowed    Spouse name: Not on file  . Number of children: Not on file  . Years of education: Not on file  . Highest education level: Not on file  Occupational History  . Not on file  Social Needs  . Financial resource strain: Not on file  . Food insecurity:    Worry: Not on file    Inability: Not on file  . Transportation needs:    Medical: Not on file    Non-medical: Not on file  Tobacco Use  . Smoking status: Current Some Day Smoker    Packs/day: 0.25    Years: 40.00    Pack years: 10.00    Types: Cigarettes  .  Smokeless tobacco: Never Used  Substance and Sexual Activity  . Alcohol use: No  . Drug use: No    Comment: 2 cig /day qod  . Sexual activity: Not on file  Lifestyle  . Physical activity:    Days per week: Not on file    Minutes per session: Not on file  . Stress: Not on file  Relationships  . Social connections:    Talks on phone: Not on file    Gets together: Not on file    Attends religious service: Not on file    Active member of club or organization: Not on file    Attends meetings of clubs or organizations: Not on file    Relationship status: Not on file  . Intimate partner violence:    Fear of current or ex partner: Not on file    Emotionally abused: Not on file    Physically abused: Not on file    Forced sexual activity: Not on file  Other Topics Concern  . Not on file  Social History Narrative  . Not on file    Current Outpatient Medications on File Prior to Visit  Medication Sig Dispense Refill  . gabapentin (NEURONTIN) 300 MG capsule Take 300 mg by mouth 2 (two) times daily.     Marland Kitchen GARLIC  PO Take 1,000 mg by mouth daily.    Marland Kitchen. glucose blood (CONTOUR NEXT TEST) test strip 1 each 4 (four) times daily by Other route. And lancets 2/day 100 each 12  . hydrochlorothiazide (HYDRODIURIL) 25 MG tablet Take 25 mg by mouth daily.    . Insulin Glargine (BASAGLAR KWIKPEN) 100 UNIT/ML SOPN Inject 0.5 mLs (50 Units total) every morning into the skin. And pen needles 1/day 5 pen 11  . levothyroxine (SYNTHROID, LEVOTHROID) 150 MCG tablet Take 1 tablet (150 mcg total) by mouth daily. 90 tablet 3  . Melatonin 5 MG TABS Take 5 mg by mouth at bedtime.    . meloxicam (MOBIC) 15 MG tablet Take 15 mg by mouth daily as needed for pain.     . pantoprazole (PROTONIX) 40 MG tablet Take 40 mg by mouth daily.    . sucralfate (CARAFATE) 1 g tablet Take 1 g by mouth 2 (two) times daily.    . traMADol (ULTRAM) 50 MG tablet Take 50 mg by mouth 2 (two) times daily.     . Vitamin D, Ergocalciferol,  (DRISDOL) 50000 units CAPS capsule TAKE 1 CAPSULE (50,000 UNITS TOTAL) BY MOUTH 3 (THREE) TIMES A WEEK. 36 capsule 0   No current facility-administered medications on file prior to visit.     No Known Allergies  Family History  Problem Relation Age of Onset  . Hypertension Mother   . Diabetes Mother   . Hypertension Father   . Stroke Father   . Diabetes Father   . Hypertension Brother   . Diabetes Brother   . Thyroid disease Neg Hx     BP 124/70 (BP Location: Right Arm, Patient Position: Sitting, Cuff Size: Normal)   Pulse 89   Ht 5\' 5"  (1.651 m)   Wt 186 lb 12.8 oz (84.7 kg)   SpO2 95%   BMI 31.09 kg/m    Review of Systems She has lost 1 lb since last ov    Objective:   Physical Exam VITAL SIGNS:  See vs page GENERAL: no distress Pulses: dorsalis pedis intact bilat.   MSK: no deformity of the feet CV: no leg edema Skin:  no ulcer on the feet.  normal color and temp on the feet. Neuro: sensation is intact to touch on the feet    Lab Results  Component Value Date   HGBA1C 7.1 (A) 06/17/2018      Assessment & Plan:  Type 1 DM: well-controlled.  Obesity: only slightly better.  Redouble efforts.   Patient Instructions  check your blood sugar twice a day.  vary the time of day when you check, between before the 3 meals, and at bedtime.  also check if you have symptoms of your blood sugar being too high or too low.  please keep a record of the readings and bring it to your next appointment here (or you can bring the meter itself).  You can write it on any piece of paper.  please call us sooner if your blood sugar goes below 70, or if you have a lot of readings over 200.   Please continue the same insulin.   Please come back for a follow-up appointment in 4-6 months.

## 2018-10-04 ENCOUNTER — Other Ambulatory Visit: Payer: Self-pay | Admitting: Endocrinology

## 2018-11-02 ENCOUNTER — Other Ambulatory Visit: Payer: Self-pay | Admitting: Endocrinology

## 2018-11-17 ENCOUNTER — Ambulatory Visit (INDEPENDENT_AMBULATORY_CARE_PROVIDER_SITE_OTHER): Payer: BLUE CROSS/BLUE SHIELD | Admitting: Endocrinology

## 2018-11-17 ENCOUNTER — Encounter: Payer: Self-pay | Admitting: Endocrinology

## 2018-11-17 VITALS — BP 110/62 | HR 78 | Temp 98.4°F | Wt 181.8 lb

## 2018-11-17 DIAGNOSIS — E119 Type 2 diabetes mellitus without complications: Secondary | ICD-10-CM

## 2018-11-17 DIAGNOSIS — Z794 Long term (current) use of insulin: Secondary | ICD-10-CM

## 2018-11-17 LAB — POCT GLYCOSYLATED HEMOGLOBIN (HGB A1C): HEMOGLOBIN A1C: 10.5 % — AB (ref 4.0–5.6)

## 2018-11-17 NOTE — Progress Notes (Signed)
Subjective:    Patient ID: Tiffany FleetingLaurie E Navarro, female    DOB: 10/11/1956, 62 y.o.   MRN: [id number]  HPI Pt returns for f/u of diabetes mellitus: DM type: 1 Dx'ed: 2018 Complications: none Therapy: insulin since soon after dx GDM: never DKA: never Severe hypoglycemia: never.  Pancreatitis: never Pancreatic imaging: normal on 2018 CT Other: type 1 DM was dx'ed when pt presented with severe hyperglycemia and ketonuria; she declines multiple daily injections; she did not tolerate metformin (GI sxs).   Interval history: Pt says she feels much better, since recent UTI.  No recent steroids.  Pt says when she had UTI (x 2 weeks), cbg's were in the 300's.  Since UTI is resolved, cbg's have improved to the 100's.  She takes synthroid as rx'ed.   Past Medical History:  Diagnosis Date  . Hypertension   . Hypothyroidism   . Osteoarthritis   . Thyroid disease     Past Surgical History:  Procedure Laterality Date  . BACK SURGERY  82  . HIP PINNING Left 2012   x3   pin removed last surgery  . JOINT REPLACEMENT    . TOTAL KNEE ARTHROPLASTY Left 02/17/2016   Procedure: TOTAL KNEE ARTHROPLASTY;  Surgeon: Dannielle HuhSteve Lucey, MD;  Location: [facility];  Service: Orthopedics;  Laterality: Left;  . TOTAL KNEE ARTHROPLASTY Right 05/11/2016   Procedure: TOTAL KNEE ARTHROPLASTY;  Surgeon: Dannielle HuhSteve Lucey, MD;  Location: [facility];  Service: Orthopedics;  Laterality: Right;    Social History   Socioeconomic History  . Marital status: Widowed    Spouse name: Not on file  . Number of children: Not on file  . Years of education: Not on file  . Highest education level: Not on file  Occupational History  . Not on file  Social Needs  . Financial resource strain: Not on file  . Food insecurity:    Worry: Not on file    Inability: Not on file  . Transportation needs:    Medical: Not on file    Non-medical: Not on file  Tobacco Use  . Smoking status: Current Some Day Smoker    Packs/day: 0.25    Years: 40.00   Pack years: 10.00    Types: Cigarettes  . Smokeless tobacco: Never Used  Substance and Sexual Activity  . Alcohol use: No  . Drug use: No    Comment: 2 cig /day qod  . Sexual activity: Not on file  Lifestyle  . Physical activity:    Days per week: Not on file    Minutes per session: Not on file  . Stress: Not on file  Relationships  . Social connections:    Talks on phone: Not on file    Gets together: Not on file    Attends religious service: Not on file    Active member of club or organization: Not on file    Attends meetings of clubs or organizations: Not on file    Relationship status: Not on file  . Intimate partner violence:    Fear of current or ex partner: Not on file    Emotionally abused: Not on file    Physically abused: Not on file    Forced sexual activity: Not on file  Other Topics Concern  . Not on file  Social History Narrative  . Not on file    Current Outpatient Medications on File Prior to Visit  Medication Sig Dispense Refill  . glucose blood (CONTOUR NEXT TEST) test strip  1 each 4 (four) times daily by Other route. And lancets 2/day 100 each 12  . hydrochlorothiazide (HYDRODIURIL) 25 MG tablet Take 25 mg by mouth daily.    . Insulin Glargine (BASAGLAR KWIKPEN) 100 UNIT/ML SOPN INJECT 50 UNITS UNDER THE SKIN EVERY MORNING 45 pen 3  . levothyroxine (SYNTHROID, LEVOTHROID) 150 MCG tablet TAKE 1 TABLET BY MOUTH EVERY DAY 90 tablet 3  . Melatonin 5 MG TABS Take 5 mg by mouth at bedtime.    . meloxicam (MOBIC) 15 MG tablet Take 15 mg by mouth daily as needed for pain.     . pantoprazole (PROTONIX) 40 MG tablet Take 40 mg by mouth daily.    . pregabalin (LYRICA) 50 MG capsule Take 50 mg by mouth 2 (two) times daily.    . sucralfate (CARAFATE) 1 g tablet Take 1 g by mouth 2 (two) times daily.    . traMADol (ULTRAM) 50 MG tablet Take 50 mg by mouth 2 (two) times daily.     . Vitamin D, Ergocalciferol, (DRISDOL) 50000 units CAPS capsule TAKE 1 CAPSULE (50,000  UNITS TOTAL) BY MOUTH 3 (THREE) TIMES A WEEK. 36 capsule 0   No current facility-administered medications on file prior to visit.     No Known Allergies  Family History  Problem Relation Age of Onset  . Hypertension Mother   . Diabetes Mother   . Hypertension Father   . Stroke Father   . Diabetes Father   . Hypertension Brother   . Diabetes Brother   . Thyroid disease Neg Hx     BP 110/62 (BP Location: Right Arm, Patient Position: Sitting, Cuff Size: Normal)   Pulse 78   Temp 98.4 F (36.9 C) (Oral)   Wt 181 lb 12.8 oz (82.5 kg)   SpO2 98%   BMI 30.25 kg/m   Review of Systems She denies hypoglycemia    Objective:   Physical Exam VITAL SIGNS:  See vs page GENERAL: no distress Pulses: dorsalis pedis intact bilat.   MSK: no deformity of the feet, except for several overlapping toes on the left foot.   CV: no leg edema Skin:  no ulcer on the feet.  normal color and temp on the feet. Neuro: sensation is intact to touch on the feet.      Lab Results  Component Value Date   HGBA1C 10.5 (A) 11/17/2018       Assessment & Plan:  Type 1 DM: much worse: she declines 2 month f/u.  UTI: pt says this is worsening a1c, so we won't increase insulin today.   Patient Instructions  check your blood sugar twice a day.  vary the time of day when you check, between before the 3 meals, and at bedtime.  also check if you have symptoms of your blood sugar being too high or too low.  please keep a record of the readings and bring it to your next appointment here (or you can bring the meter itself).  You can write it on any piece of paper.  please call us sooner if your blood sugar goes below 70, or if you have a lot of readings over 200.   Please continue the same insulin.   Please come back for a follow-up appointment in 4 months.

## 2018-11-17 NOTE — Patient Instructions (Addendum)
check your blood sugar twice a day.  vary the time of day when you check, between before the 3 meals, and at bedtime.  also check if you have symptoms of your blood sugar being too high or too low.  please keep a record of the readings and bring it to your next appointment here (or you can bring the meter itself).  You can write it on any piece of paper.  please call us sooner if your blood sugar goes below 70, or if you have a lot of readings over 200. Please continue the same insulin.   Please come back for a follow-up appointment in 4 months.    

## 2018-11-29 ENCOUNTER — Other Ambulatory Visit: Payer: Self-pay

## 2018-11-29 ENCOUNTER — Telehealth: Payer: Self-pay | Admitting: Endocrinology

## 2018-11-29 NOTE — Telephone Encounter (Signed)
Patient stated that she is needing her pen needles sent into the pharmacy. She said they have sent over two refill request to our office for these     [id number] [place] [place] [address]

## 2018-11-29 NOTE — Telephone Encounter (Signed)
Did verbal order with pharmacy and added the pen needles to her med list

## 2019-02-06 ENCOUNTER — Other Ambulatory Visit: Payer: Self-pay

## 2019-02-06 ENCOUNTER — Telehealth: Payer: Self-pay | Admitting: Endocrinology

## 2019-02-06 MED ORDER — BASAGLAR KWIKPEN 100 UNIT/ML ~~LOC~~ SOPN: PEN_INJECTOR | SUBCUTANEOUS | 3 refills | Status: DC

## 2019-02-06 NOTE — Telephone Encounter (Signed)
Insulin Glargine (BASAGLAR KWIKPEN) 100 UNIT/ML SOPN 45 pen 3 02/06/2019    Sig: INJECT 50 UNITS UNDER THE SKIN EVERY MORNING   Sent to pharmacy as: Insulin Glargine (BASAGLAR KWIKPEN) 100 UNIT/ML Solution Pen-injector   E-Prescribing Status: Receipt confirmed by pharmacy (02/06/2019 10:33 AM EDT)

## 2019-02-06 NOTE — Telephone Encounter (Signed)
MEDICATION: Insulin Glargine (BASAGLAR KWIKPEN) 100 UNIT/ML SOPN  PHARMACY:  [id number] - [place], [place] - [address]  IS THIS A 90 DAY SUPPLY : 30 day supply  IS PATIENT OUT OF MEDICATION:  yes  IF NOT; HOW MUCH IS LEFT:   LAST APPOINTMENT DATE: @12 /31/2019  NEXT APPOINTMENT DATE:@4 /21/2020  DO WE HAVE YOUR PERMISSION TO LEAVE A DETAILED MESSAGE:  OTHER COMMENTS:    **Let patient know to contact pharmacy at the end of the day to make sure medication is ready. **  ** Please notify patient to allow 48-72 hours to process**  **Encourage patient to contact the pharmacy for refills or they can request refills through [facility]**

## 2019-02-08 ENCOUNTER — Telehealth: Payer: Self-pay | Admitting: Endocrinology

## 2019-02-08 ENCOUNTER — Other Ambulatory Visit: Payer: Self-pay

## 2019-02-08 DIAGNOSIS — E119 Type 2 diabetes mellitus without complications: Secondary | ICD-10-CM

## 2019-02-08 DIAGNOSIS — Z794 Long term (current) use of insulin: Principal | ICD-10-CM

## 2019-02-08 MED ORDER — INSULIN GLARGINE 100 UNIT/ML SOLOSTAR PEN: 50.0000 [IU] | PEN_INJECTOR | SUBCUTANEOUS | 11 refills | Status: DC

## 2019-02-08 NOTE — Telephone Encounter (Signed)
[facility] for clarification of pt message. Per [facility], PA is required. Complete PA or change to an alternative? Per [facility], they could not provide what alternative is covered by pt plan.

## 2019-02-08 NOTE — Telephone Encounter (Signed)
Please advise how many samples you would like to provide to pt (if we have them)

## 2019-02-08 NOTE — Telephone Encounter (Signed)
Insulin Glargine (LANTUS SOLOSTAR) 100 UNIT/ML Solostar Pen 5 pen 11 02/08/2019    Sig - Route: Inject 50 Units into the skin every morning. - Subcutaneous   Sent to pharmacy as: Insulin Glargine (LANTUS SOLOSTAR) 100 UNIT/ML Solostar Pen   Notes to Pharmacy: Discontinue Basaglar Rx.   E-Prescribing Status: Receipt confirmed by pharmacy (02/08/2019 11:46 AM EDT)    Tiffany Navarro pt and informed of new orders. Verbalized acceptance and understanding.

## 2019-02-08 NOTE — Telephone Encounter (Signed)
Please change to lantus.  Same dosage

## 2019-02-08 NOTE — Telephone Encounter (Signed)
[facility] [address] [facility] forInsulin Glargine (BASAGLAR KWIKPEN) 100 UNIT/ML SOPN to be sent from our office to insurance company.

## 2019-02-08 NOTE — Telephone Encounter (Signed)
Patient called [employer]) who told her that it would take 72 hours for patient to get Basaglar. Patient is almost out of Hospital doctor. Patient is requesting enough samples to get her through until Monday March 16th.

## 2019-03-20 ENCOUNTER — Encounter: Payer: Self-pay | Admitting: Endocrinology

## 2019-03-21 ENCOUNTER — Ambulatory Visit (INDEPENDENT_AMBULATORY_CARE_PROVIDER_SITE_OTHER): Payer: BLUE CROSS/BLUE SHIELD | Admitting: Endocrinology

## 2019-03-21 ENCOUNTER — Other Ambulatory Visit: Payer: Self-pay

## 2019-03-21 DIAGNOSIS — E119 Type 2 diabetes mellitus without complications: Secondary | ICD-10-CM

## 2019-03-21 DIAGNOSIS — E109 Type 1 diabetes mellitus without complications: Secondary | ICD-10-CM | POA: Diagnosis not present

## 2019-03-21 DIAGNOSIS — Z794 Long term (current) use of insulin: Secondary | ICD-10-CM

## 2019-03-21 NOTE — Progress Notes (Signed)
Subjective:    Patient ID: Tiffany Navarro, female    DOB: 09-08-1956, 63 y.o.   MRN: [id number]  HPI DM type: 1 Dx'ed: 2018 Complications: none Therapy: insulin since soon after dx GDM: never DKA: never Severe hypoglycemia: never.  Pancreatitis: never Pancreatic imaging: normal on 2018 CT Other: type 1 DM was dx'ed when pt presented with severe hyperglycemia and ketonuria; she declines multiple daily injections; she did not tolerate metformin (GI sxs).   Interval history: pt says cbg varies from 118-198.  pt states she feels well in general.  She takes insulin as rx'ed.   Past Medical History:  Diagnosis Date  . Hypertension   . Hypothyroidism   . Osteoarthritis   . Thyroid disease     Past Surgical History:  Procedure Laterality Date  . BACK SURGERY  82  . HIP PINNING Left 2012   x3   pin removed last surgery  . JOINT REPLACEMENT    . TOTAL KNEE ARTHROPLASTY Left 02/17/2016   Procedure: TOTAL KNEE ARTHROPLASTY;  Surgeon: Dannielle Huh, MD;  Location: [facility];  Service: Orthopedics;  Laterality: Left;  . TOTAL KNEE ARTHROPLASTY Right 05/11/2016   Procedure: TOTAL KNEE ARTHROPLASTY;  Surgeon: Dannielle Huh, MD;  Location: [facility];  Service: Orthopedics;  Laterality: Right;    Social History   Socioeconomic History  . Marital status: Widowed    Spouse name: Not on file  . Number of children: Not on file  . Years of education: Not on file  . Highest education level: Not on file  Occupational History  . Not on file  Social Needs  . Financial resource strain: Not on file  . Food insecurity:    Worry: Not on file    Inability: Not on file  . Transportation needs:    Medical: Not on file    Non-medical: Not on file  Tobacco Use  . Smoking status: Current Some Day Smoker    Packs/day: 0.25    Years: 40.00    Pack years: 10.00    Types: Cigarettes  . Smokeless tobacco: Never Used  Substance and Sexual Activity  . Alcohol use: No  . Drug use: No    Comment: 2 cig /day  qod  . Sexual activity: Not on file  Lifestyle  . Physical activity:    Days per week: Not on file    Minutes per session: Not on file  . Stress: Not on file  Relationships  . Social connections:    Talks on phone: Not on file    Gets together: Not on file    Attends religious service: Not on file    Active member of club or organization: Not on file    Attends meetings of clubs or organizations: Not on file    Relationship status: Not on file  . Intimate partner violence:    Fear of current or ex partner: Not on file    Emotionally abused: Not on file    Physically abused: Not on file    Forced sexual activity: Not on file  Other Topics Concern  . Not on file  Social History Narrative  . Not on file    Current Outpatient Medications on File Prior to Visit  Medication Sig Dispense Refill  . glucose blood (CONTOUR NEXT TEST) test strip 1 each 4 (four) times daily by Other route. And lancets 2/day 100 each 12  . hydrochlorothiazide (HYDRODIURIL) 25 MG tablet Take 25 mg by mouth daily.    Marland Kitchen  Insulin Glargine (LANTUS SOLOSTAR) 100 UNIT/ML Solostar Pen Inject 50 Units into the skin every morning. 5 pen 11  . Insulin Pen Needle 32G X 4 MM MISC by Does not apply route.    Marland Kitchen. levothyroxine (SYNTHROID, LEVOTHROID) 150 MCG tablet TAKE 1 TABLET BY MOUTH EVERY DAY 90 tablet 3  . Melatonin 5 MG TABS Take 5 mg by mouth at bedtime.    . meloxicam (MOBIC) 15 MG tablet Take 15 mg by mouth daily as needed for pain.     . pantoprazole (PROTONIX) 40 MG tablet Take 40 mg by mouth daily.    . pregabalin (LYRICA) 50 MG capsule Take 50 mg by mouth 2 (two) times daily.    . sucralfate (CARAFATE) 1 g tablet Take 1 g by mouth 2 (two) times daily.    . traMADol (ULTRAM) 50 MG tablet Take 50 mg by mouth 2 (two) times daily.     . Vitamin D, Ergocalciferol, (DRISDOL) 50000 units CAPS capsule TAKE 1 CAPSULE (50,000 UNITS TOTAL) BY MOUTH 3 (THREE) TIMES A WEEK. 36 capsule 0   No current facility-administered  medications on file prior to visit.     No Known Allergies  Family History  Problem Relation Age of Onset  . Hypertension Mother   . Diabetes Mother   . Hypertension Father   . Stroke Father   . Diabetes Father   . Hypertension Brother   . Diabetes Brother   . Thyroid disease Neg Hx     Review of Systems She denies hypoglycemia    Objective:   Physical Exam  Lab Results  Component Value Date   HGBA1C 7.4 (A) 03/23/2019      Assessment & Plan:  Type 1 DM: this is the best control this pt should aim for, given this regimen, which does match insulin to her changing needs throughout the day.  Please continue the same insulin.  Please come back for a follow-up appointment in 3 months.

## 2019-03-21 NOTE — Patient Instructions (Addendum)
check your blood sugar twice a day.  vary the time of day when you check, between before the 3 meals, and at bedtime.  also check if you have symptoms of your blood sugar being too high or too low.  please keep a record of the readings and bring it to your next appointment here (or you can bring the meter itself).  You can write it on any piece of paper.  please call us sooner if your blood sugar goes below 70, or if you have a lot of readings over 200.   Please come in for the A1c test.   Please come back for a follow-up appointment in 4 months.

## 2019-03-23 ENCOUNTER — Ambulatory Visit (INDEPENDENT_AMBULATORY_CARE_PROVIDER_SITE_OTHER): Payer: BLUE CROSS/BLUE SHIELD

## 2019-03-23 DIAGNOSIS — E119 Type 2 diabetes mellitus without complications: Secondary | ICD-10-CM

## 2019-03-23 DIAGNOSIS — Z794 Long term (current) use of insulin: Secondary | ICD-10-CM | POA: Diagnosis not present

## 2019-03-23 LAB — POCT GLYCOSYLATED HEMOGLOBIN (HGB A1C): Hemoglobin A1C: 7.4 % — AB (ref 4.0–5.6)

## 2019-05-30 ENCOUNTER — Ambulatory Visit: Payer: BLUE CROSS/BLUE SHIELD | Admitting: Endocrinology

## 2019-06-13 ENCOUNTER — Other Ambulatory Visit: Payer: Self-pay

## 2019-06-15 ENCOUNTER — Encounter: Payer: Self-pay | Admitting: Endocrinology

## 2019-06-15 ENCOUNTER — Other Ambulatory Visit: Payer: Self-pay

## 2019-06-15 ENCOUNTER — Ambulatory Visit (INDEPENDENT_AMBULATORY_CARE_PROVIDER_SITE_OTHER): Payer: Medicare Other | Admitting: Endocrinology

## 2019-06-15 VITALS — BP 144/80 | HR 96 | Ht 65.0 in | Wt 180.0 lb

## 2019-06-15 DIAGNOSIS — Z9119 Patient's noncompliance with other medical treatment and regimen: Secondary | ICD-10-CM | POA: Diagnosis not present

## 2019-06-15 DIAGNOSIS — R609 Edema, unspecified: Secondary | ICD-10-CM

## 2019-06-15 DIAGNOSIS — E109 Type 1 diabetes mellitus without complications: Secondary | ICD-10-CM | POA: Diagnosis not present

## 2019-06-15 DIAGNOSIS — E119 Type 2 diabetes mellitus without complications: Secondary | ICD-10-CM

## 2019-06-15 LAB — POCT GLYCOSYLATED HEMOGLOBIN (HGB A1C): Hemoglobin A1C: 8.6 % — AB (ref 4.0–5.6)

## 2019-06-15 MED ORDER — CONTOUR MONITOR DEVI: 1.0000 | Freq: Once | 0 refills | Status: AC

## 2019-06-15 MED ORDER — LANTUS SOLOSTAR 100 UNIT/ML ~~LOC~~ SOPN: 55.0000 [IU] | PEN_INJECTOR | SUBCUTANEOUS | 11 refills | Status: DC

## 2019-06-15 NOTE — Progress Notes (Signed)
Subjective:    Patient ID: Tiffany Navarro, female    DOB: 12/07/1955, 63 y.o.   MRN: [id number]  HPI DM type: 1 Dx'ed: 2018 Complications: none Therapy: insulin since soon after dx GDM: never DKA: never Severe hypoglycemia: never.  Pancreatitis: never Pancreatic imaging: normal on 2018 CT Other: type 1 DM was dx'ed when pt presented with severe hyperglycemia and ketonuria; she declines multiple daily injections; she did not tolerate metformin (GI sxs).   Interval history: no cbg record, but states cbg varies from 144-200's.  She has not recently checked, as she needs a new meter.  pt states she feels well in general.  She takes insulin as rx'ed.   Past Medical History:  Diagnosis Date  . Hypertension   . Hypothyroidism   . Osteoarthritis   . Thyroid disease     Past Surgical History:  Procedure Laterality Date  . BACK SURGERY  82  . HIP PINNING Left 2012   x3   pin removed last surgery  . JOINT REPLACEMENT    . TOTAL KNEE ARTHROPLASTY Left 02/17/2016   Procedure: TOTAL KNEE ARTHROPLASTY;  Surgeon: Dannielle HuhSteve Lucey, MD;  Location: [facility];  Service: Orthopedics;  Laterality: Left;  . TOTAL KNEE ARTHROPLASTY Right 05/11/2016   Procedure: TOTAL KNEE ARTHROPLASTY;  Surgeon: Dannielle HuhSteve Lucey, MD;  Location: [facility];  Service: Orthopedics;  Laterality: Right;    Social History   Socioeconomic History  . Marital status: Widowed    Spouse name: Not on file  . Number of children: Not on file  . Years of education: Not on file  . Highest education level: Not on file  Occupational History  . Not on file  Social Needs  . Financial resource strain: Not on file  . Food insecurity    Worry: Not on file    Inability: Not on file  . Transportation needs    Medical: Not on file    Non-medical: Not on file  Tobacco Use  . Smoking status: Current Some Day Smoker    Packs/day: 0.25    Years: 40.00    Pack years: 10.00    Types: Cigarettes  . Smokeless tobacco: Never Used  Substance and  Sexual Activity  . Alcohol use: No  . Drug use: No    Comment: 2 cig /day qod  . Sexual activity: Not on file  Lifestyle  . Physical activity    Days per week: Not on file    Minutes per session: Not on file  . Stress: Not on file  Relationships  . Social Musicianconnections    Talks on phone: Not on file    Gets together: Not on file    Attends religious service: Not on file    Active member of club or organization: Not on file    Attends meetings of clubs or organizations: Not on file    Relationship status: Not on file  . Intimate partner violence    Fear of current or ex partner: Not on file    Emotionally abused: Not on file    Physically abused: Not on file    Forced sexual activity: Not on file  Other Topics Concern  . Not on file  Social History Narrative  . Not on file    Current Outpatient Medications on File Prior to Visit  Medication Sig Dispense Refill  . glucose blood (CONTOUR NEXT TEST) test strip 1 each 4 (four) times daily by Other route. And lancets 2/day (Patient taking differently:  1 each by Other route 3 (three) times daily. TID) 100 each 12  . hydrochlorothiazide (HYDRODIURIL) 25 MG tablet Take 25 mg by mouth daily.    . Insulin Pen Needle 32G X 4 MM MISC by Does not apply route.    Marland Kitchen levothyroxine (SYNTHROID, LEVOTHROID) 150 MCG tablet TAKE 1 TABLET BY MOUTH EVERY DAY 90 tablet 3  . Melatonin 5 MG TABS Take 5 mg by mouth at bedtime.    . meloxicam (MOBIC) 15 MG tablet Take 15 mg by mouth daily as needed for pain.     . pantoprazole (PROTONIX) 40 MG tablet Take 40 mg by mouth daily.    . pregabalin (LYRICA) 50 MG capsule Take 50 mg by mouth 2 (two) times daily.    . sucralfate (CARAFATE) 1 g tablet Take 1 g by mouth 2 (two) times daily.    . traMADol (ULTRAM) 50 MG tablet Take 50 mg by mouth 2 (two) times daily.     . Vitamin D, Ergocalciferol, (DRISDOL) 50000 units CAPS capsule TAKE 1 CAPSULE (50,000 UNITS TOTAL) BY MOUTH 3 (THREE) TIMES A WEEK. 36 capsule 0    No current facility-administered medications on file prior to visit.     No Known Allergies  Family History  Problem Relation Age of Onset  . Hypertension Mother   . Diabetes Mother   . Hypertension Father   . Stroke Father   . Diabetes Father   . Hypertension Brother   . Diabetes Brother   . Thyroid disease Neg Hx     BP (!) 144/80 (BP Location: Left Arm, Patient Position: Sitting, Cuff Size: Large)   Pulse 96   Ht 5\' 5"  (1.651 m)   Wt 180 lb (81.6 kg)   SpO2 93%   BMI 29.95 kg/m     Review of Systems She denies hypoglycemia.  She has lost a few lbs    Objective:   Physical Exam VITAL SIGNS:  See vs page GENERAL: no distress Pulses: dorsalis pedis intact bilat.   MSK: no deformity of the feet CV: trace bilat leg edema Skin:  no ulcer on the feet.  normal color and temp on the feet. Neuro: sensation is intact to touch on the feet   Lab Results  Component Value Date   HGBA1C 8.6 (A) 06/15/2019       Assessment & Plan:  Type 1 DM: worse.   Edema: This limits rx options Noncompliance with cbg recording: she needs a new meter.   Patient Instructions  I have sent a prescription to your pharmacy, for a new meter. check your blood sugar twice a day.  vary the time of day when you check, between before the 3 meals, and at bedtime.  also check if you have symptoms of your blood sugar being too high or too low.  please keep a record of the readings and bring it to your next appointment here (or you can bring the meter itself).  You can write it on any piece of paper.  please call us sooner if your blood sugar goes below 70, or if you have a lot of readings over 200. I have sent a prescription to your pharmacy, to increase the Lantus to 55 units each morning. Please come back for a follow-up appointment in 2 months.

## 2019-06-15 NOTE — Patient Instructions (Signed)
I have sent a prescription to your pharmacy, for a new meter. check your blood sugar twice a day.  vary the time of day when you check, between before the 3 meals, and at bedtime.  also check if you have symptoms of your blood sugar being too high or too low.  please keep a record of the readings and bring it to your next appointment here (or you can bring the meter itself).  You can write it on any piece of paper.  please call us sooner if your blood sugar goes below 70, or if you have a lot of readings over 200. I have sent a prescription to your pharmacy, to increase the Lantus to 55 units each morning. Please come back for a follow-up appointment in 2 months.

## 2019-08-17 ENCOUNTER — Ambulatory Visit: Payer: Medicare Other | Admitting: Endocrinology

## 2019-09-15 ENCOUNTER — Other Ambulatory Visit: Payer: Self-pay

## 2019-09-19 ENCOUNTER — Other Ambulatory Visit: Payer: Self-pay

## 2019-09-19 ENCOUNTER — Ambulatory Visit (INDEPENDENT_AMBULATORY_CARE_PROVIDER_SITE_OTHER): Payer: Medicare Other | Admitting: Endocrinology

## 2019-09-19 ENCOUNTER — Encounter: Payer: Self-pay | Admitting: Endocrinology

## 2019-09-19 VITALS — BP 126/70 | HR 84 | Ht 65.0 in | Wt 180.8 lb

## 2019-09-19 DIAGNOSIS — E119 Type 2 diabetes mellitus without complications: Secondary | ICD-10-CM

## 2019-09-19 DIAGNOSIS — Z794 Long term (current) use of insulin: Secondary | ICD-10-CM

## 2019-09-19 LAB — POCT GLYCOSYLATED HEMOGLOBIN (HGB A1C): Hemoglobin A1C: 8.6 % — AB (ref 4.0–5.6)

## 2019-09-19 MED ORDER — LANTUS SOLOSTAR 100 UNIT/ML ~~LOC~~ SOPN: 60.0000 [IU] | PEN_INJECTOR | SUBCUTANEOUS | 11 refills | Status: DC

## 2019-09-19 NOTE — Patient Instructions (Addendum)
check your blood sugar twice a day.  vary the time of day when you check, between before the 3 meals, and at bedtime.  also check if you have symptoms of your blood sugar being too high or too low.  please keep a record of the readings and bring it to your next appointment here (or you can bring the meter itself).  You can write it on any piece of paper.  please call us sooner if your blood sugar goes below 70, or if you have a lot of readings over 200. I have sent a prescription to your pharmacy, to increase the Lantus to 60 units each morning.   Please come back for a follow-up appointment in 3 months.

## 2019-09-19 NOTE — Progress Notes (Signed)
Subjective:    Patient ID: Tiffany Navarro, female    DOB: 10/05/1956, 63 y.o.   MRN: [id number]  HPI  Pt returns for f/u of DM:  DM type: 1 Dx'ed: 2831 Complications: none Therapy: insulin since soon after dx GDM: never DKA: never Severe hypoglycemia: never.  Pancreatitis: never Pancreatic imaging: normal on 2018 CT Other: type 1 DM was dx'ed when pt presented with severe hyperglycemia and ketonuria; she declines multiple daily injections; she did not tolerate metformin (GI sxs).   Interval history: she brings her meter with her cbg's which I have reviewed today.  cbg varies from 110-404.  pt states she feels well in general.  Pt says she never misses the insulin.   Past Medical History:  Diagnosis Date  . Hypertension   . Hypothyroidism   . Osteoarthritis   . Thyroid disease     Past Surgical History:  Procedure Laterality Date  . BACK SURGERY  82  . HIP PINNING Left 2012   x3   pin removed last surgery  . JOINT REPLACEMENT    . TOTAL KNEE ARTHROPLASTY Left 02/17/2016   Procedure: TOTAL KNEE ARTHROPLASTY;  Surgeon: Vickey Huger, MD;  Location: [place];  Service: Orthopedics;  Laterality: Left;  . TOTAL KNEE ARTHROPLASTY Right 05/11/2016   Procedure: TOTAL KNEE ARTHROPLASTY;  Surgeon: Vickey Huger, MD;  Location: [place];  Service: Orthopedics;  Laterality: Right;    Social History   Socioeconomic History  . Marital status: Widowed    Spouse name: Not on file  . Number of children: Not on file  . Years of education: Not on file  . Highest education level: Not on file  Occupational History  . Not on file  Social Needs  . Financial resource strain: Not on file  . Food insecurity    Worry: Not on file    Inability: Not on file  . Transportation needs    Medical: Not on file    Non-medical: Not on file  Tobacco Use  . Smoking status: Current Some Day Smoker    Packs/day: 0.25    Years: 40.00    Pack years: 10.00    Types: Cigarettes  . Smokeless tobacco: Never  Used  Substance and Sexual Activity  . Alcohol use: No  . Drug use: No    Comment: 2 cig /day qod  . Sexual activity: Not on file  Lifestyle  . Physical activity    Days per week: Not on file    Minutes per session: Not on file  . Stress: Not on file  Relationships  . Social Herbalist on phone: Not on file    Gets together: Not on file    Attends religious service: Not on file    Active member of club or organization: Not on file    Attends meetings of clubs or organizations: Not on file    Relationship status: Not on file  . Intimate partner violence    Fear of current or ex partner: Not on file    Emotionally abused: Not on file    Physically abused: Not on file    Forced sexual activity: Not on file  Other Topics Concern  . Not on file  Social History Narrative  . Not on file    Current Outpatient Medications on File Prior to Visit  Medication Sig Dispense Refill  . glucose blood (CONTOUR NEXT TEST) test strip 1 each 4 (four) times daily by Other route.  And lancets 2/day (Patient taking differently: 1 each by Other route 3 (three) times daily. TID) 100 each 12  . hydrochlorothiazide (HYDRODIURIL) 25 MG tablet Take 25 mg by mouth daily.    . Insulin Pen Needle 32G X 4 MM MISC by Does not apply route.    Marland Kitchen levothyroxine (SYNTHROID, LEVOTHROID) 150 MCG tablet TAKE 1 TABLET BY MOUTH EVERY DAY 90 tablet 3  . Melatonin 5 MG TABS Take 5 mg by mouth at bedtime.    . meloxicam (MOBIC) 15 MG tablet Take 15 mg by mouth daily as needed for pain.     . pantoprazole (PROTONIX) 40 MG tablet Take 40 mg by mouth daily.    . pregabalin (LYRICA) 50 MG capsule Take 50 mg by mouth 2 (two) times daily.    . sucralfate (CARAFATE) 1 g tablet Take 1 g by mouth 2 (two) times daily.    . traMADol (ULTRAM) 50 MG tablet Take 50 mg by mouth 2 (two) times daily.     . Vitamin D, Ergocalciferol, (DRISDOL) 50000 units CAPS capsule TAKE 1 CAPSULE (50,000 UNITS TOTAL) BY MOUTH 3 (THREE) TIMES A  WEEK. 36 capsule 0   No current facility-administered medications on file prior to visit.     No Known Allergies  Family History  Problem Relation Age of Onset  . Hypertension Mother   . Diabetes Mother   . Hypertension Father   . Stroke Father   . Diabetes Father   . Hypertension Brother   . Diabetes Brother   . Thyroid disease Neg Hx     BP 126/70 (BP Location: Left Arm, Patient Position: Sitting, Cuff Size: Large)   Pulse 84   Ht 5\' 5"  (1.651 m)   Wt 180 lb 12.8 oz (82 kg)   SpO2 96%   BMI 30.09 kg/m    Review of Systems She denies hypoglycemia.      Objective:   Physical Exam VITAL SIGNS:  See vs page GENERAL: no distress Pulses: dorsalis pedis intact bilat.   MSK: no deformity of the feet CV: trace bilat leg edema Skin:  no ulcer on the feet.  normal color and temp on the feet. Neuro: sensation is intact to touch on the feet Ext: there is bilateral onychomycosis of the toenails  A1c=8.6%     Assessment & Plan:  Type 1 DM: she needs increased rx   Patient Instructions  check your blood sugar twice a day.  vary the time of day when you check, between before the 3 meals, and at bedtime.  also check if you have symptoms of your blood sugar being too high or too low.  please keep a record of the readings and bring it to your next appointment here (or you can bring the meter itself).  You can write it on any piece of paper.  please call sooner if your blood sugar goes below 70, or if you have a lot of readings over 200. I have sent a prescription to your pharmacy, to increase the Lantus to 60 units each morning.   Please come back for a follow-up appointment in 3 months.

## 2019-10-21 ENCOUNTER — Other Ambulatory Visit: Payer: Self-pay | Admitting: Endocrinology

## 2019-12-19 ENCOUNTER — Other Ambulatory Visit: Payer: Self-pay

## 2019-12-21 ENCOUNTER — Other Ambulatory Visit: Payer: Self-pay

## 2019-12-21 ENCOUNTER — Encounter: Payer: Self-pay | Admitting: Endocrinology

## 2019-12-21 ENCOUNTER — Ambulatory Visit (INDEPENDENT_AMBULATORY_CARE_PROVIDER_SITE_OTHER): Payer: Medicare HMO | Admitting: Endocrinology

## 2019-12-21 VITALS — BP 144/80 | HR 105 | Ht 65.0 in | Wt 185.4 lb

## 2019-12-21 DIAGNOSIS — I10 Essential (primary) hypertension: Secondary | ICD-10-CM | POA: Diagnosis not present

## 2019-12-21 DIAGNOSIS — E109 Type 1 diabetes mellitus without complications: Secondary | ICD-10-CM | POA: Diagnosis not present

## 2019-12-21 DIAGNOSIS — E039 Hypothyroidism, unspecified: Secondary | ICD-10-CM | POA: Diagnosis not present

## 2019-12-21 DIAGNOSIS — E119 Type 2 diabetes mellitus without complications: Secondary | ICD-10-CM

## 2019-12-21 LAB — POCT GLYCOSYLATED HEMOGLOBIN (HGB A1C): Hemoglobin A1C: 8.8 % — AB (ref 4.0–5.6)

## 2019-12-21 LAB — BASIC METABOLIC PANEL
BUN: 18 mg/dL (ref 6–23)
CO2: 28 mEq/L (ref 19–32)
Calcium: 9.1 mg/dL (ref 8.4–10.5)
Chloride: 100 mEq/L (ref 96–112)
Creatinine, Ser: 0.81 mg/dL (ref 0.40–1.20)
GFR: 71.29 mL/min (ref >60.00)
Glucose, Bld: 186 mg/dL — ABNORMAL HIGH (ref 70–99)
Potassium: 3.8 mEq/L (ref 3.5–5.1)
Sodium: 137 mEq/L (ref 135–145)

## 2019-12-21 LAB — T4, FREE: Free T4: 1.48 ng/dL (ref 0.60–1.60)

## 2019-12-21 LAB — TSH: TSH: 0.08 u[IU]/mL — ABNORMAL LOW (ref 0.35–4.50)

## 2019-12-21 MED ORDER — BASAGLAR KWIKPEN 100 UNIT/ML ~~LOC~~ SOPN: 60.0000 [IU] | PEN_INJECTOR | SUBCUTANEOUS | 3 refills | Status: DC

## 2019-12-21 MED ORDER — BASAGLAR KWIKPEN 100 UNIT/ML ~~LOC~~ SOPN: 65.0000 [IU] | PEN_INJECTOR | SUBCUTANEOUS | 3 refills | Status: DC

## 2019-12-21 MED ORDER — LEVOTHYROXINE SODIUM 125 MCG PO TABS: 125.0000 ug | ORAL_TABLET | Freq: Every day | ORAL | 3 refills | Status: DC

## 2019-12-21 NOTE — Patient Instructions (Addendum)
Your blood pressure is high today.  Please see your primary care provider soon, to have it rechecked check your blood sugar twice a day.  vary the time of day when you check, between before the 3 meals, and at bedtime.  also check if you have symptoms of your blood sugar being too high or too low.  please keep a record of the readings and bring it to your next appointment here (or you can bring the meter itself).  You can write it on any piece of paper.  please call us sooner if your blood sugar goes below 70, or if you have a lot of readings over 200. I have sent a prescription to your pharmacy, to change the Lantus to basaglar, 65 units each morning.   Please come back for a follow-up appointment in 2 months.

## 2019-12-21 NOTE — Progress Notes (Signed)
Subjective:    Patient ID: Tiffany Navarro, female    DOB: Feb 13, 1956, 64 y.o.   MRN: [id number]  HPI Pt returns for f/u of DM:  DM type: 1 Dx'ed: 7989 Complications: none Therapy: insulin since soon after dx.  GDM: never DKA: never Severe hypoglycemia: never.  Pancreatitis: never Pancreatic imaging: normal on 2018 CT Other: type 1 DM was dx'ed when pt presented with severe hyperglycemia and ketonuria; she declines multiple daily injections; she did not tolerate metformin (GI sxs).   Interval history: she brings her meter with her cbg's which I have reviewed today.  cbg varies from 98-361. It is in general higher as the day goes on.  pt states she feels well in general.  Pt says she never misses the insulin.  She says glycemic control will be better on Basaglar.   Past Medical History:  Diagnosis Date  . Hypertension   . Hypothyroidism   . Osteoarthritis   . Thyroid disease     Past Surgical History:  Procedure Laterality Date  . BACK SURGERY  82  . HIP PINNING Left 2012   x3   pin removed last surgery  . JOINT REPLACEMENT    . TOTAL KNEE ARTHROPLASTY Left 02/17/2016   Procedure: TOTAL KNEE ARTHROPLASTY;  Surgeon: Vickey Huger, MD;  Location: [place];  Service: Orthopedics;  Laterality: Left;  . TOTAL KNEE ARTHROPLASTY Right 05/11/2016   Procedure: TOTAL KNEE ARTHROPLASTY;  Surgeon: Vickey Huger, MD;  Location: [place];  Service: Orthopedics;  Laterality: Right;    Social History   Socioeconomic History  . Marital status: Widowed    Spouse name: Not on file  . Number of children: Not on file  . Years of education: Not on file  . Highest education level: Not on file  Occupational History  . Not on file  Tobacco Use  . Smoking status: Current Some Day Smoker    Packs/day: 0.25    Years: 40.00    Pack years: 10.00    Types: Cigarettes  . Smokeless tobacco: Never Used  Substance and Sexual Activity  . Alcohol use: No  . Drug use: No    Comment: 2 cig /day qod  .  Sexual activity: Not on file  Other Topics Concern  . Not on file  Social History Narrative  . Not on file   Social Determinants of Health   Financial Resource Strain:   . Difficulty of Paying Living Expenses: Not on file  Food Insecurity:   . Worried About Charity fundraiser in the Last Year: Not on file  . Ran Out of Food in the Last Year: Not on file  Transportation Needs:   . Lack of Transportation (Medical): Not on file  . Lack of Transportation (Non-Medical): Not on file  Physical Activity:   . Days of Exercise per Week: Not on file  . Minutes of Exercise per Session: Not on file  Stress:   . Feeling of Stress : Not on file  Social Connections:   . Frequency of Communication with Friends and Family: Not on file  . Frequency of Social Gatherings with Friends and Family: Not on file  . Attends Religious Services: Not on file  . Active Member of Clubs or Organizations: Not on file  . Attends Archivist Meetings: Not on file  . Marital Status: Not on file  Intimate Partner Violence:   . Fear of Current or Ex-Partner: Not on file  . Emotionally Abused: Not  on file  . Physically Abused: Not on file  . Sexually Abused: Not on file    Current Outpatient Medications on File Prior to Visit  Medication Sig Dispense Refill  . glucose blood (CONTOUR NEXT TEST) test strip 1 each 4 (four) times daily by Other route. And lancets 2/day (Patient taking differently: 1 each by Other route 3 (three) times daily. TID) 100 each 12  . hydrochlorothiazide (HYDRODIURIL) 25 MG tablet Take 25 mg by mouth daily.    . Insulin Pen Needle 32G X 4 MM MISC by Does not apply route.    . Melatonin 5 MG TABS Take 5 mg by mouth at bedtime.    . meloxicam (MOBIC) 15 MG tablet Take 15 mg by mouth daily as needed for pain.     . pantoprazole (PROTONIX) 40 MG tablet Take 40 mg by mouth daily.    . pregabalin (LYRICA) 50 MG capsule Take 50 mg by mouth 2 (two) times daily.    . sucralfate (CARAFATE)  1 g tablet Take 1 g by mouth 2 (two) times daily.    . traMADol (ULTRAM) 50 MG tablet Take 50 mg by mouth 2 (two) times daily.     . Vitamin D, Ergocalciferol, (DRISDOL) 50000 units CAPS capsule TAKE 1 CAPSULE (50,000 UNITS TOTAL) BY MOUTH 3 (THREE) TIMES A WEEK. 36 capsule 0   No current facility-administered medications on file prior to visit.    No Known Allergies  Family History  Problem Relation Age of Onset  . Hypertension Mother   . Diabetes Mother   . Hypertension Father   . Stroke Father   . Diabetes Father   . Hypertension Brother   . Diabetes Brother   . Thyroid disease Neg Hx     BP (!) 144/80 (BP Location: Left Arm, Patient Position: Sitting, Cuff Size: Large)   Pulse (!) 105   Ht 5\' 5"  (1.651 m)   Wt 185 lb 6.4 oz (84.1 kg)   SpO2 99%   BMI 30.85 kg/m    Review of Systems She denies hypoglycemia    Objective:   Physical Exam VITAL SIGNS:  See vs page GENERAL: no distress Pulses: dorsalis pedis intact bilat.   MSK: no deformity of the feet CV: 1+ bilat leg edema.  Skin:  no ulcer on the feet.  normal color and temp on the feet.  Neuro: sensation is intact to touch on the feet.    Lab Results  Component Value Date   HGBA1C 8.8 (A) 12/21/2019   Lab Results  Component Value Date   TSH 0.08 (L) 12/21/2019      Assessment & Plan:  Type 1 DM: worse.  At pt's request, I am changing to basaglar. Hypothyroidism: overcontrolled: reduce synthroid HTN: is noted today  Patient Instructions  Your blood pressure is high today.  Please see your primary care provider soon, to have it rechecked check your blood sugar twice a day.  vary the time of day when you check, between before the 3 meals, and at bedtime.  also check if you have symptoms of your blood sugar being too high or too low.  please keep a record of the readings and bring it to your next appointment here (or you can bring the meter itself).  You can write it on any piece of paper.  please call 12/23/2019  sooner if your blood sugar goes below 70, or if you have a lot of readings over 200. I have sent a prescription  to your pharmacy, to change the Lantus to basaglar, 65 units each morning.   Please come back for a follow-up appointment in 2 months.

## 2019-12-22 ENCOUNTER — Telehealth: Payer: Self-pay

## 2019-12-22 NOTE — Telephone Encounter (Signed)
Lab results reviewed by Dr. Ellison. Called pt to inform about lab results as well as new orders. LVM requesting returned call. 

## 2019-12-22 NOTE — Telephone Encounter (Signed)
-----   Message from Romero Belling, MD sent at 12/21/2019  3:42 PM EST ----- please contact patient: We need to slightly decrease the thyroid pill.  I have sent a prescription to your pharmacy.  I'll see you next time.

## 2019-12-22 NOTE — Telephone Encounter (Signed)
Patient returning Tiffany Navarro call and is requesting Ammie call her back

## 2019-12-22 NOTE — Telephone Encounter (Signed)
Returned pt call. Informed her about her lab results and new orders. Using closed-loop communication, pt verbalized complete acceptance and understanding of all information provided. No further questions nor concerns were voiced at this time.  Outpatient Medication Detail   Disp Refills Start End   levothyroxine (SYNTHROID) 125 MCG tablet 90 tablet 3 12/21/2019    Sig - Route: Take 1 tablet (125 mcg total) by mouth daily before breakfast. - Oral   Sent to pharmacy as: levothyroxine (SYNTHROID) 125 MCG tablet   E-Prescribing Status: Receipt confirmed by pharmacy (12/21/2019  3:42 PM EST)

## 2020-02-16 ENCOUNTER — Other Ambulatory Visit: Payer: Self-pay

## 2020-02-20 ENCOUNTER — Telehealth: Payer: Self-pay

## 2020-02-20 ENCOUNTER — Encounter: Payer: Self-pay | Admitting: Endocrinology

## 2020-02-20 ENCOUNTER — Ambulatory Visit: Payer: Medicare HMO | Admitting: Endocrinology

## 2020-02-20 ENCOUNTER — Other Ambulatory Visit: Payer: Self-pay

## 2020-02-20 VITALS — BP 134/80 | HR 91 | Ht 65.0 in | Wt 185.0 lb

## 2020-02-20 DIAGNOSIS — E039 Hypothyroidism, unspecified: Secondary | ICD-10-CM

## 2020-02-20 DIAGNOSIS — E109 Type 1 diabetes mellitus without complications: Secondary | ICD-10-CM | POA: Diagnosis not present

## 2020-02-20 DIAGNOSIS — E119 Type 2 diabetes mellitus without complications: Secondary | ICD-10-CM

## 2020-02-20 DIAGNOSIS — Z794 Long term (current) use of insulin: Secondary | ICD-10-CM

## 2020-02-20 LAB — POCT GLYCOSYLATED HEMOGLOBIN (HGB A1C): Hemoglobin A1C: 7.8 % — AB (ref 4.0–5.6)

## 2020-02-20 LAB — T4, FREE: Free T4: 1.14 ng/dL (ref 0.60–1.60)

## 2020-02-20 LAB — TSH: TSH: 0.38 u[IU]/mL (ref 0.35–4.50)

## 2020-02-20 MED ORDER — BASAGLAR KWIKPEN 100 UNIT/ML ~~LOC~~ SOPN: 58.0000 [IU] | PEN_INJECTOR | SUBCUTANEOUS | 3 refills | Status: DC

## 2020-02-20 NOTE — Telephone Encounter (Signed)
-----   Message from Romero Belling, MD sent at 02/20/2020 12:43 PM EDT ----- please contact patient: Thyroid is normal.  Please continue the same levothyroxine.  I'll see you next time.

## 2020-02-20 NOTE — Progress Notes (Signed)
Subjective:    Patient ID: Tiffany Navarro, female    DOB: 1956/02/09, 64 y.o.   MRN: [id number]  HPI Pt returns for f/u of DM:  DM type: 1 Dx'ed: 2018 Complications: none Therapy: insulin since soon after dx.  GDM: never DKA: never Severe hypoglycemia: never.  Pancreatitis: never Pancreatic imaging: normal on 2018 CT Other: type 1 DM was dx'ed when pt presented with severe hyperglycemia and ketonuria; she declines multiple daily injections; she did not tolerate metformin (GI sxs); pt says Hospital doctor works better than Lantus.   Interval history: no cbg record, but states cbg varies from 98-244. It is in general higher as the day goes on.  pt states she feels well in general.  Pt says she never misses the insulin.  Past Medical History:  Diagnosis Date  . Hypertension   . Hypothyroidism   . Osteoarthritis   . Thyroid disease     Past Surgical History:  Procedure Laterality Date  . BACK SURGERY  82  . HIP PINNING Left 2012   x3   pin removed last surgery  . JOINT REPLACEMENT    . TOTAL KNEE ARTHROPLASTY Left 02/17/2016   Procedure: TOTAL KNEE ARTHROPLASTY;  Surgeon: Dannielle Huh, MD;  Location: [facility];  Service: Orthopedics;  Laterality: Left;  . TOTAL KNEE ARTHROPLASTY Right 05/11/2016   Procedure: TOTAL KNEE ARTHROPLASTY;  Surgeon: Dannielle Huh, MD;  Location: [facility];  Service: Orthopedics;  Laterality: Right;    Social History   Socioeconomic History  . Marital status: Widowed    Spouse name: Not on file  . Number of children: Not on file  . Years of education: Not on file  . Highest education level: Not on file  Occupational History  . Not on file  Tobacco Use  . Smoking status: Current Some Day Smoker    Packs/day: 0.25    Years: 40.00    Pack years: 10.00    Types: Cigarettes  . Smokeless tobacco: Never Used  Substance and Sexual Activity  . Alcohol use: No  . Drug use: No    Comment: 2 cig /day qod  . Sexual activity: Not on file  Other Topics Concern  .  Not on file  Social History Narrative  . Not on file   Social Determinants of Health   Financial Resource Strain:   . Difficulty of Paying Living Expenses:   Food Insecurity:   . Worried About Programme researcher, broadcasting/film/video in the Last Year:   . Barista in the Last Year:   Transportation Needs:   . Freight forwarder (Medical):   Marland Kitchen Lack of Transportation (Non-Medical):   Physical Activity:   . Days of Exercise per Week:   . Minutes of Exercise per Session:   Stress:   . Feeling of Stress :   Social Connections:   . Frequency of Communication with Friends and Family:   . Frequency of Social Gatherings with Friends and Family:   . Attends Religious Services:   . Active Member of Clubs or Organizations:   . Attends Banker Meetings:   Marland Kitchen Marital Status:   Intimate Partner Violence:   . Fear of Current or Ex-Partner:   . Emotionally Abused:   Marland Kitchen Physically Abused:   . Sexually Abused:     Current Outpatient Medications on File Prior to Visit  Medication Sig Dispense Refill  . glucose blood (CONTOUR NEXT TEST) test strip 1 each 4 (four) times daily by  Other route. And lancets 2/day (Patient taking differently: 1 each by Other route 3 (three) times daily. TID) 100 each 12  . hydrochlorothiazide (HYDRODIURIL) 25 MG tablet Take 25 mg by mouth daily.    . Insulin Pen Needle 32G X 4 MM MISC by Does not apply route.    Marland Kitchen levothyroxine (SYNTHROID) 125 MCG tablet Take 1 tablet (125 mcg total) by mouth daily before breakfast. 90 tablet 3  . Melatonin 5 MG TABS Take 5 mg by mouth at bedtime.    . meloxicam (MOBIC) 15 MG tablet Take 15 mg by mouth daily as needed for pain.     . pantoprazole (PROTONIX) 40 MG tablet Take 40 mg by mouth daily.    . pregabalin (LYRICA) 50 MG capsule Take 50 mg by mouth 2 (two) times daily.    . sucralfate (CARAFATE) 1 g tablet Take 1 g by mouth 2 (two) times daily.    . traMADol (ULTRAM) 50 MG tablet Take 50 mg by mouth 2 (two) times daily.       . Vitamin D, Ergocalciferol, (DRISDOL) 50000 units CAPS capsule TAKE 1 CAPSULE (50,000 UNITS TOTAL) BY MOUTH 3 (THREE) TIMES A WEEK. 36 capsule 0   No current facility-administered medications on file prior to visit.    No Known Allergies  Family History  Problem Relation Age of Onset  . Hypertension Mother   . Diabetes Mother   . Hypertension Father   . Stroke Father   . Diabetes Father   . Hypertension Brother   . Diabetes Brother   . Thyroid disease Neg Hx     BP 134/80   Pulse 91   Ht 5\' 5"  (1.651 m)   Wt 185 lb (83.9 kg)   SpO2 94%   BMI 30.79 kg/m    Review of Systems She denies hypoglycemia.     Objective:   Physical Exam VITAL SIGNS:  See vs page GENERAL: no distress Pulses: dorsalis pedis intact bilat.   MSK: no deformity of the feet CV: no leg edema Skin:  no ulcer on the feet.  normal color and temp on the feet. Neuro: sensation is intact to touch on the feet  A1c=7.8%  Lab Results  Component Value Date   TSH 0.38 02/20/2020      Assessment & Plan:  Type 1 DM: She would benefit from increased rx, if it can be done with a regimen that avoids or minimizes hypoglycemia. Hypothyroidism: well-replaced.  Please continue the same medication.  Patient Instructions  Blood tests are requested for you today.  We'll let you know about the results.  check your blood sugar twice a day.  vary the time of day when you check, between before the 3 meals, and at bedtime.  also check if you have symptoms of your blood sugar being too high or too low.  please keep a record of the readings and bring it to your next appointment here (or you can bring the meter itself).  You can write it on any piece of paper.  please call us sooner if your blood sugar goes below 70, or if you have a lot of readings over 200. Please increase the Basaglar to 68 units each morning.   Please come back for a follow-up appointment in 3 months.

## 2020-02-20 NOTE — Patient Instructions (Addendum)
Blood tests are requested for you today.  We'll let you know about the results.  check your blood sugar twice a day.  vary the time of day when you check, between before the 3 meals, and at bedtime.  also check if you have symptoms of your blood sugar being too high or too low.  please keep a record of the readings and bring it to your next appointment here (or you can bring the meter itself).  You can write it on any piece of paper.  please call us sooner if your blood sugar goes below 70, or if you have a lot of readings over 200. Please increase the Basaglar to 68 units each morning.   Please come back for a follow-up appointment in 3 months.

## 2020-02-20 NOTE — Telephone Encounter (Signed)
LAB RESULTS  Lab results were reviewed by Dr. Ellison. A letter has been mailed to pt home address. For future reference, letter can be found in Epic. 

## 2020-03-11 DIAGNOSIS — I10 Essential (primary) hypertension: Secondary | ICD-10-CM | POA: Diagnosis not present

## 2020-03-11 DIAGNOSIS — D559 Anemia due to enzyme disorder, unspecified: Secondary | ICD-10-CM | POA: Diagnosis not present

## 2020-03-11 DIAGNOSIS — K219 Gastro-esophageal reflux disease without esophagitis: Secondary | ICD-10-CM | POA: Diagnosis not present

## 2020-03-11 DIAGNOSIS — M545 Low back pain: Secondary | ICD-10-CM | POA: Diagnosis not present

## 2020-03-11 DIAGNOSIS — E034 Atrophy of thyroid (acquired): Secondary | ICD-10-CM | POA: Diagnosis not present

## 2020-03-11 DIAGNOSIS — E119 Type 2 diabetes mellitus without complications: Secondary | ICD-10-CM | POA: Diagnosis not present

## 2020-03-11 DIAGNOSIS — E1165 Type 2 diabetes mellitus with hyperglycemia: Secondary | ICD-10-CM | POA: Diagnosis not present

## 2020-05-08 ENCOUNTER — Other Ambulatory Visit: Payer: Self-pay | Admitting: Endocrinology

## 2020-05-22 ENCOUNTER — Encounter: Payer: Self-pay | Admitting: Endocrinology

## 2020-05-22 ENCOUNTER — Other Ambulatory Visit: Payer: Self-pay

## 2020-05-22 ENCOUNTER — Ambulatory Visit (INDEPENDENT_AMBULATORY_CARE_PROVIDER_SITE_OTHER): Payer: Medicare HMO | Admitting: Endocrinology

## 2020-05-22 VITALS — BP 122/60 | HR 82 | Ht 65.0 in | Wt 184.0 lb

## 2020-05-22 DIAGNOSIS — E119 Type 2 diabetes mellitus without complications: Secondary | ICD-10-CM

## 2020-05-22 DIAGNOSIS — Z794 Long term (current) use of insulin: Secondary | ICD-10-CM | POA: Diagnosis not present

## 2020-05-22 LAB — POCT GLYCOSYLATED HEMOGLOBIN (HGB A1C): Hemoglobin A1C: 7.6 % — AB (ref 4.0–5.6)

## 2020-05-22 MED ORDER — BASAGLAR KWIKPEN 100 UNIT/ML ~~LOC~~ SOPN: 68.0000 [IU] | PEN_INJECTOR | SUBCUTANEOUS | 3 refills | Status: DC

## 2020-05-22 NOTE — Progress Notes (Signed)
Subjective:    Patient ID: Tiffany Navarro, female    DOB: 03-24-1956, 64 y.o.   MRN: [id number]  HPI Pt returns for f/u of DM:  DM type: 1 Dx'ed: 2018 Complications: none Therapy: insulin since soon after dx.  GDM: never DKA: never Severe hypoglycemia: never.  Pancreatitis: never Pancreatic imaging: normal on 2018 CT SDOH: she declines to add another med, due to cost. Other: type 1 DM was dx'ed when pt presented with severe hyperglycemia and ketonuria; she declines multiple daily injections; she did not tolerate metformin (GI sxs); pt says Hospital doctor works better than Lantus.   Interval history: no cbg record, but states cbg varies from 96-277.  It is in general higher as the day goes on.  pt states she feels well in general.  Pt says she never misses the insulin.  Past Medical History:  Diagnosis Date  . Hypertension   . Hypothyroidism   . Osteoarthritis   . Thyroid disease     Past Surgical History:  Procedure Laterality Date  . BACK SURGERY  82  . HIP PINNING Left 2012   x3   pin removed last surgery  . JOINT REPLACEMENT    . TOTAL KNEE ARTHROPLASTY Left 02/17/2016   Procedure: TOTAL KNEE ARTHROPLASTY;  Surgeon: Dannielle Huh, MD;  Location: [facility] OR;  Service: Orthopedics;  Laterality: Left;  . TOTAL KNEE ARTHROPLASTY Right 05/11/2016   Procedure: TOTAL KNEE ARTHROPLASTY;  Surgeon: Dannielle Huh, MD;  Location: [facility] OR;  Service: Orthopedics;  Laterality: Right;    Social History   Socioeconomic History  . Marital status: Widowed    Spouse name: Not on file  . Number of children: Not on file  . Years of education: Not on file  . Highest education level: Not on file  Occupational History  . Not on file  Tobacco Use  . Smoking status: Current Some Day Smoker    Packs/day: 0.25    Years: 40.00    Pack years: 10.00    Types: Cigarettes  . Smokeless tobacco: Never Used  Substance and Sexual Activity  . Alcohol use: No  . Drug use: No    Comment: 2 cig /day qod  .  Sexual activity: Not on file  Other Topics Concern  . Not on file  Social History Narrative  . Not on file   Social Determinants of Health   Financial Resource Strain:   . Difficulty of Paying Living Expenses:   Food Insecurity:   . Worried About Programme researcher, broadcasting/film/video in the Last Year:   . Barista in the Last Year:   Transportation Needs:   . Freight forwarder (Medical):   Marland Kitchen Lack of Transportation (Non-Medical):   Physical Activity:   . Days of Exercise per Week:   . Minutes of Exercise per Session:   Stress:   . Feeling of Stress :   Social Connections:   . Frequency of Communication with Friends and Family:   . Frequency of Social Gatherings with Friends and Family:   . Attends Religious Services:   . Active Member of Clubs or Organizations:   . Attends Banker Meetings:   Marland Kitchen Marital Status:   Intimate Partner Violence:   . Fear of Current or Ex-Partner:   . Emotionally Abused:   Marland Kitchen Physically Abused:   . Sexually Abused:     Current Outpatient Medications on File Prior to Visit  Medication Sig Dispense Refill  . glucose blood (CONTOUR  NEXT TEST) test strip 1 each 4 (four) times daily by Other route. And lancets 2/day (Patient taking differently: 1 each by Other route 3 (three) times daily. TID) 100 each 12  . hydrochlorothiazide (HYDRODIURIL) 25 MG tablet Take 25 mg by mouth daily.    . Insulin Pen Needle 32G X 4 MM MISC by Does not apply route.    Marland Kitchen levothyroxine (SYNTHROID) 125 MCG tablet Take 1 tablet (125 mcg total) by mouth daily before breakfast. 90 tablet 3  . Melatonin 5 MG TABS Take 5 mg by mouth at bedtime.    . meloxicam (MOBIC) 15 MG tablet Take 15 mg by mouth daily as needed for pain.     . pantoprazole (PROTONIX) 40 MG tablet Take 40 mg by mouth daily.    . pregabalin (LYRICA) 50 MG capsule Take 50 mg by mouth 2 (two) times daily.    . sucralfate (CARAFATE) 1 g tablet Take 1 g by mouth 2 (two) times daily.    . traMADol (ULTRAM) 50  MG tablet Take 50 mg by mouth 2 (two) times daily.     . Vitamin D, Ergocalciferol, (DRISDOL) 50000 units CAPS capsule TAKE 1 CAPSULE (50,000 UNITS TOTAL) BY MOUTH 3 (THREE) TIMES A WEEK. 36 capsule 0   No current facility-administered medications on file prior to visit.    No Known Allergies  Family History  Problem Relation Age of Onset  . Hypertension Mother   . Diabetes Mother   . Hypertension Father   . Stroke Father   . Diabetes Father   . Hypertension Brother   . Diabetes Brother   . Thyroid disease Neg Hx     BP 122/60   Pulse 82   Ht 5\' 5"  (1.651 m)   Wt 184 lb (83.5 kg)   SpO2 97%   BMI 30.62 kg/m    Review of Systems She denies hypoglycemia    Objective:   Physical Exam VITAL SIGNS:  See vs page GENERAL: no distress Pulses: dorsalis pedis intact bilat.   MSK: no deformity of the feet CV: trace bilat leg edema Skin:  no ulcer on the feet.  normal color and temp on the feet. Neuro: sensation is intact to touch on the feet.   Ext: there is bilateral onychomycosis of the toenails  Lab Results  Component Value Date   HGBA1C 7.6 (A) 05/22/2020        Assessment & Plan:  Type 1 DM: She would benefit from increased rx, if it can be done with a regimen that avoids or minimizes hypoglycemia.  She declines to add another med.    Patient Instructions  check your blood sugar twice a day.  vary the time of day when you check, between before the 3 meals, and at bedtime.  also check if you have symptoms of your blood sugar being too high or too low.  please keep a record of the readings and bring it to your next appointment here (or you can bring the meter itself).  You can write it on any piece of paper.  please call us sooner if your blood sugar goes below 70, or if you have a lot of readings over 200. Please continue the same Basaglar.   Please come back for a follow-up appointment in 3 months.

## 2020-05-22 NOTE — Patient Instructions (Addendum)
check your blood sugar twice a day.  vary the time of day when you check, between before the 3 meals, and at bedtime.  also check if you have symptoms of your blood sugar being too high or too low.  please keep a record of the readings and bring it to your next appointment here (or you can bring the meter itself).  You can write it on any piece of paper.  please call us sooner if your blood sugar goes below 70, or if you have a lot of readings over 200. Please continue the same Basaglar.   Please come back for a follow-up appointment in 3 months.

## 2020-08-22 ENCOUNTER — Telehealth: Payer: Self-pay | Admitting: Endocrinology

## 2020-08-22 ENCOUNTER — Ambulatory Visit (INDEPENDENT_AMBULATORY_CARE_PROVIDER_SITE_OTHER): Payer: Medicare HMO | Admitting: Endocrinology

## 2020-08-22 ENCOUNTER — Other Ambulatory Visit: Payer: Self-pay

## 2020-08-22 VITALS — BP 160/90 | HR 95 | Ht 65.0 in | Wt 185.0 lb

## 2020-08-22 DIAGNOSIS — Z794 Long term (current) use of insulin: Secondary | ICD-10-CM | POA: Diagnosis not present

## 2020-08-22 DIAGNOSIS — E119 Type 2 diabetes mellitus without complications: Secondary | ICD-10-CM

## 2020-08-22 DIAGNOSIS — E109 Type 1 diabetes mellitus without complications: Secondary | ICD-10-CM | POA: Diagnosis not present

## 2020-08-22 LAB — POCT GLYCOSYLATED HEMOGLOBIN (HGB A1C): Hemoglobin A1C: 8 % — AB (ref 4.0–5.6)

## 2020-08-22 MED ORDER — BASAGLAR KWIKPEN 100 UNIT/ML ~~LOC~~ SOPN: 72.0000 [IU] | PEN_INJECTOR | SUBCUTANEOUS | 3 refills | Status: DC

## 2020-08-22 NOTE — Patient Instructions (Addendum)
check your blood sugar twice a day.  vary the time of day when you check, between before the 3 meals, and at bedtime.  also check if you have symptoms of your blood sugar being too high or too low.  please keep a record of the readings and bring it to your next appointment here (or you can bring the meter itself).  You can write it on any piece of paper.  please call us sooner if your blood sugar goes below 70, or if you have a lot of readings over 200. Please increase the Basaglar to 72 units each morning.  Please come back for a follow-up appointment in 2-3 months.

## 2020-08-22 NOTE — Telephone Encounter (Signed)
Patient called asking if we could call her in a [employer] meter along with all that it comes with.  [id number] [place], [place] - [address] Phone:  [phone]  Fax:  [phone]

## 2020-08-22 NOTE — Progress Notes (Signed)
Subjective:    Patient ID: Tiffany Navarro, female    DOB: 1956/09/02, 64 y.o.   MRN: [id number]  HPI Pt returns for f/u of DM:  DM type: 1 Dx'ed: 2018.   Complications: none Therapy: insulin since soon after dx.  GDM: never DKA: never Severe hypoglycemia: never.  Pancreatitis: never Pancreatic imaging: normal on 2018 CT SDOH: she declines to add another med, due to cost. Other: type 1 DM was dx'ed when pt presented with severe hyperglycemia and ketonuria; she declines multiple daily injections; she did not tolerate metformin (GI sxs); pt says Hospital doctor works better than Lantus.   Interval history: no cbg record, but states cbg varies from 92-377.  It is in general higher as the day goes on, but lowest when a meal is missed.  pt states she feels well in general.  Pt says she never misses the insulin.  Past Medical History:  Diagnosis Date  . Hypertension   . Hypothyroidism   . Osteoarthritis   . Thyroid disease     Past Surgical History:  Procedure Laterality Date  . BACK SURGERY  82  . HIP PINNING Left 2012   x3   pin removed last surgery  . JOINT REPLACEMENT    . TOTAL KNEE ARTHROPLASTY Left 02/17/2016   Procedure: TOTAL KNEE ARTHROPLASTY;  Surgeon: Dannielle Huh, MD;  Location: [facility];  Service: Orthopedics;  Laterality: Left;  . TOTAL KNEE ARTHROPLASTY Right 05/11/2016   Procedure: TOTAL KNEE ARTHROPLASTY;  Surgeon: Dannielle Huh, MD;  Location: [facility];  Service: Orthopedics;  Laterality: Right;    Social History   Socioeconomic History  . Marital status: Widowed    Spouse name: Not on file  . Number of children: Not on file  . Years of education: Not on file  . Highest education level: Not on file  Occupational History  . Not on file  Tobacco Use  . Smoking status: Current Some Day Smoker    Packs/day: 0.25    Years: 40.00    Pack years: 10.00    Types: Cigarettes  . Smokeless tobacco: Never Used  Substance and Sexual Activity  . Alcohol use: No  . Drug use: No     Comment: 2 cig /day qod  . Sexual activity: Not on file  Other Topics Concern  . Not on file  Social History Narrative  . Not on file   Social Determinants of Health   Financial Resource Strain:   . Difficulty of Paying Living Expenses: Not on file  Food Insecurity:   . Worried About Programme researcher, broadcasting/film/video in the Last Year: Not on file  . Ran Out of Food in the Last Year: Not on file  Transportation Needs:   . Lack of Transportation (Medical): Not on file  . Lack of Transportation (Non-Medical): Not on file  Physical Activity:   . Days of Exercise per Week: Not on file  . Minutes of Exercise per Session: Not on file  Stress:   . Feeling of Stress : Not on file  Social Connections:   . Frequency of Communication with Friends and Family: Not on file  . Frequency of Social Gatherings with Friends and Family: Not on file  . Attends Religious Services: Not on file  . Active Member of Clubs or Organizations: Not on file  . Attends Banker Meetings: Not on file  . Marital Status: Not on file  Intimate Partner Violence:   . Fear of Current or Ex-Partner:  Not on file  . Emotionally Abused: Not on file  . Physically Abused: Not on file  . Sexually Abused: Not on file    Current Outpatient Medications on File Prior to Visit  Medication Sig Dispense Refill  . Cholecalciferol (VITAMIN D3) 1.25 MG (50000 UT) CAPS Take 1 capsule by mouth once a week.    Marland Kitchen gemfibrozil (LOPID) 600 MG tablet Take 600 mg by mouth 2 (two) times daily.    Marland Kitchen glucose blood (CONTOUR NEXT TEST) test strip 1 each 4 (four) times daily by Other route. And lancets 2/day (Patient taking differently: 1 each by Other route 3 (three) times daily. TID) 100 each 12  . hydrochlorothiazide (HYDRODIURIL) 25 MG tablet Take 25 mg by mouth daily.    . Insulin Pen Needle 32G X 4 MM MISC by Does not apply route.    Marland Kitchen levothyroxine (SYNTHROID) 125 MCG tablet Take 1 tablet (125 mcg total) by mouth daily before breakfast.  90 tablet 3  . Melatonin 5 MG TABS Take 5 mg by mouth at bedtime.    . meloxicam (MOBIC) 15 MG tablet Take 15 mg by mouth daily as needed for pain.     . pantoprazole (PROTONIX) 40 MG tablet Take 40 mg by mouth daily.    . pregabalin (LYRICA) 50 MG capsule Take 50 mg by mouth 2 (two) times daily.    . sucralfate (CARAFATE) 1 g tablet Take 1 g by mouth 2 (two) times daily.    . traMADol (ULTRAM) 50 MG tablet Take 50 mg by mouth 2 (two) times daily.     . Vitamin D, Ergocalciferol, (DRISDOL) 50000 units CAPS capsule TAKE 1 CAPSULE (50,000 UNITS TOTAL) BY MOUTH 3 (THREE) TIMES A WEEK. 36 capsule 0   No current facility-administered medications on file prior to visit.    No Known Allergies  Family History  Problem Relation Age of Onset  . Hypertension Mother   . Diabetes Mother   . Hypertension Father   . Stroke Father   . Diabetes Father   . Hypertension Brother   . Diabetes Brother   . Thyroid disease Neg Hx     BP (!) 160/90   Pulse 95   Ht 5\' 5"  (1.651 m)   Wt 185 lb (83.9 kg)   SpO2 (!) 88%   BMI 30.79 kg/m    Review of Systems She denies hypoglycemia.      Objective:   Physical Exam VITAL SIGNS:  See vs page GENERAL: no distress Pulses: dorsalis pedis intact bilat.   MSK: no deformity of the feet CV: 1+ bilat leg edema Skin:  no ulcer on the feet.  normal color and temp on the feet. Neuro: sensation is intact to touch on the feet.   Lab Results  Component Value Date   HGBA1C 8.0 (A) 08/22/2020   Lab Results  Component Value Date   TSH 0.38 02/20/2020       Assessment & Plan:  Type 1 DM: uncontrolled.   Patient Instructions  check your blood sugar twice a day.  vary the time of day when you check, between before the 3 meals, and at bedtime.  also check if you have symptoms of your blood sugar being too high or too low.  please keep a record of the readings and bring it to your next appointment here (or you can bring the meter itself).  You can write it  on any piece of paper.  please call 02/22/2020 sooner if your  blood sugar goes below 70, or if you have a lot of readings over 200. Please increase the Basaglar to 72 units each morning.  Please come back for a follow-up appointment in 2-3 months.

## 2020-08-26 ENCOUNTER — Other Ambulatory Visit: Payer: Self-pay

## 2020-08-26 DIAGNOSIS — R69 Illness, unspecified: Secondary | ICD-10-CM | POA: Diagnosis not present

## 2020-08-26 DIAGNOSIS — E119 Type 2 diabetes mellitus without complications: Secondary | ICD-10-CM

## 2020-08-26 DIAGNOSIS — E109 Type 1 diabetes mellitus without complications: Secondary | ICD-10-CM

## 2020-08-26 DIAGNOSIS — Z794 Long term (current) use of insulin: Secondary | ICD-10-CM

## 2020-08-26 MED ORDER — FREESTYLE LITE DEVI: 1 refills | Status: DC

## 2020-08-26 MED ORDER — FREESTYLE UNISTICK II LANCETS MISC: 3 refills | Status: AC

## 2020-08-26 MED ORDER — FREESTYLE LITE TEST VI STRP: ORAL_STRIP | 12 refills | Status: DC

## 2020-09-12 DIAGNOSIS — M545 Low back pain: Secondary | ICD-10-CM | POA: Diagnosis not present

## 2020-09-12 DIAGNOSIS — E034 Atrophy of thyroid (acquired): Secondary | ICD-10-CM | POA: Diagnosis not present

## 2020-09-12 DIAGNOSIS — E119 Type 2 diabetes mellitus without complications: Secondary | ICD-10-CM | POA: Diagnosis not present

## 2020-09-16 DIAGNOSIS — I10 Essential (primary) hypertension: Secondary | ICD-10-CM | POA: Diagnosis not present

## 2020-09-16 DIAGNOSIS — E034 Atrophy of thyroid (acquired): Secondary | ICD-10-CM | POA: Diagnosis not present

## 2020-09-16 DIAGNOSIS — E559 Vitamin D deficiency, unspecified: Secondary | ICD-10-CM | POA: Diagnosis not present

## 2020-09-16 DIAGNOSIS — E785 Hyperlipidemia, unspecified: Secondary | ICD-10-CM | POA: Diagnosis not present

## 2020-09-16 DIAGNOSIS — E1165 Type 2 diabetes mellitus with hyperglycemia: Secondary | ICD-10-CM | POA: Diagnosis not present

## 2020-09-20 DIAGNOSIS — R69 Illness, unspecified: Secondary | ICD-10-CM | POA: Diagnosis not present

## 2020-11-27 ENCOUNTER — Other Ambulatory Visit: Payer: Self-pay | Admitting: Endocrinology

## 2020-12-02 ENCOUNTER — Ambulatory Visit: Payer: Medicare HMO | Admitting: Endocrinology

## 2020-12-05 DIAGNOSIS — E119 Type 2 diabetes mellitus without complications: Secondary | ICD-10-CM | POA: Diagnosis not present

## 2020-12-05 DIAGNOSIS — E039 Hypothyroidism, unspecified: Secondary | ICD-10-CM | POA: Diagnosis not present

## 2021-01-03 DIAGNOSIS — E785 Hyperlipidemia, unspecified: Secondary | ICD-10-CM | POA: Diagnosis not present

## 2021-01-03 DIAGNOSIS — E119 Type 2 diabetes mellitus without complications: Secondary | ICD-10-CM | POA: Diagnosis not present

## 2021-01-03 DIAGNOSIS — E559 Vitamin D deficiency, unspecified: Secondary | ICD-10-CM | POA: Diagnosis not present

## 2021-01-03 DIAGNOSIS — E034 Atrophy of thyroid (acquired): Secondary | ICD-10-CM | POA: Diagnosis not present

## 2021-01-15 DIAGNOSIS — H2513 Age-related nuclear cataract, bilateral: Secondary | ICD-10-CM | POA: Diagnosis not present

## 2021-01-15 DIAGNOSIS — E119 Type 2 diabetes mellitus without complications: Secondary | ICD-10-CM | POA: Diagnosis not present

## 2021-01-15 DIAGNOSIS — S0502XA Injury of conjunctiva and corneal abrasion without foreign body, left eye, initial encounter: Secondary | ICD-10-CM | POA: Diagnosis not present

## 2021-01-16 DIAGNOSIS — S0502XD Injury of conjunctiva and corneal abrasion without foreign body, left eye, subsequent encounter: Secondary | ICD-10-CM | POA: Diagnosis not present

## 2021-01-16 DIAGNOSIS — E119 Type 2 diabetes mellitus without complications: Secondary | ICD-10-CM | POA: Diagnosis not present

## 2021-01-20 DIAGNOSIS — E119 Type 2 diabetes mellitus without complications: Secondary | ICD-10-CM | POA: Diagnosis not present

## 2021-01-20 DIAGNOSIS — S0502XD Injury of conjunctiva and corneal abrasion without foreign body, left eye, subsequent encounter: Secondary | ICD-10-CM | POA: Diagnosis not present

## 2021-03-18 DIAGNOSIS — N390 Urinary tract infection, site not specified: Secondary | ICD-10-CM | POA: Diagnosis not present

## 2021-03-18 DIAGNOSIS — M545 Low back pain, unspecified: Secondary | ICD-10-CM | POA: Diagnosis not present

## 2021-03-18 DIAGNOSIS — E034 Atrophy of thyroid (acquired): Secondary | ICD-10-CM | POA: Diagnosis not present

## 2021-05-13 ENCOUNTER — Emergency Department (HOSPITAL_COMMUNITY): Payer: Medicare HMO

## 2021-05-13 ENCOUNTER — Inpatient Hospital Stay (HOSPITAL_COMMUNITY)
Admission: EM | Admit: 2021-05-13 | Discharge: 2021-05-17 | DRG: 036 | Disposition: A | Discharge status: Home / Self Care | Payer: Medicare HMO | Attending: Internal Medicine | Admitting: Internal Medicine

## 2021-05-13 ENCOUNTER — Encounter (HOSPITAL_COMMUNITY): Payer: Self-pay | Admitting: *Deleted

## 2021-05-13 DIAGNOSIS — I63231 Cerebral infarction due to unspecified occlusion or stenosis of right carotid arteries: Secondary | ICD-10-CM | POA: Diagnosis not present

## 2021-05-13 DIAGNOSIS — F419 Anxiety disorder, unspecified: Secondary | ICD-10-CM | POA: Diagnosis present

## 2021-05-13 DIAGNOSIS — Z794 Long term (current) use of insulin: Secondary | ICD-10-CM | POA: Diagnosis not present

## 2021-05-13 DIAGNOSIS — R4701 Aphasia: Secondary | ICD-10-CM | POA: Diagnosis present

## 2021-05-13 DIAGNOSIS — I639 Cerebral infarction, unspecified: Secondary | ICD-10-CM | POA: Diagnosis not present

## 2021-05-13 DIAGNOSIS — Z79899 Other long term (current) drug therapy: Secondary | ICD-10-CM | POA: Diagnosis not present

## 2021-05-13 DIAGNOSIS — Z20822 Contact with and (suspected) exposure to covid-19: Secondary | ICD-10-CM | POA: Diagnosis present

## 2021-05-13 DIAGNOSIS — E119 Type 2 diabetes mellitus without complications: Secondary | ICD-10-CM | POA: Diagnosis not present

## 2021-05-13 DIAGNOSIS — I6340 Cerebral infarction due to embolism of unspecified cerebral artery: Principal | ICD-10-CM | POA: Diagnosis present

## 2021-05-13 DIAGNOSIS — M5430 Sciatica, unspecified side: Secondary | ICD-10-CM | POA: Diagnosis present

## 2021-05-13 DIAGNOSIS — Z823 Family history of stroke: Secondary | ICD-10-CM

## 2021-05-13 DIAGNOSIS — Z79890 Hormone replacement therapy (postmenopausal): Secondary | ICD-10-CM

## 2021-05-13 DIAGNOSIS — I6523 Occlusion and stenosis of bilateral carotid arteries: Secondary | ICD-10-CM | POA: Diagnosis not present

## 2021-05-13 DIAGNOSIS — I10 Essential (primary) hypertension: Secondary | ICD-10-CM | POA: Diagnosis not present

## 2021-05-13 DIAGNOSIS — Z8249 Family history of ischemic heart disease and other diseases of the circulatory system: Secondary | ICD-10-CM | POA: Diagnosis not present

## 2021-05-13 DIAGNOSIS — Z833 Family history of diabetes mellitus: Secondary | ICD-10-CM | POA: Diagnosis not present

## 2021-05-13 DIAGNOSIS — I63411 Cerebral infarction due to embolism of right middle cerebral artery: Secondary | ICD-10-CM | POA: Diagnosis present

## 2021-05-13 DIAGNOSIS — Z96653 Presence of artificial knee joint, bilateral: Secondary | ICD-10-CM | POA: Diagnosis not present

## 2021-05-13 DIAGNOSIS — D72829 Elevated white blood cell count, unspecified: Secondary | ICD-10-CM | POA: Diagnosis present

## 2021-05-13 DIAGNOSIS — I6521 Occlusion and stenosis of right carotid artery: Secondary | ICD-10-CM

## 2021-05-13 DIAGNOSIS — E785 Hyperlipidemia, unspecified: Secondary | ICD-10-CM | POA: Diagnosis not present

## 2021-05-13 DIAGNOSIS — E1040 Type 1 diabetes mellitus with diabetic neuropathy, unspecified: Secondary | ICD-10-CM | POA: Diagnosis present

## 2021-05-13 DIAGNOSIS — E039 Hypothyroidism, unspecified: Secondary | ICD-10-CM | POA: Diagnosis present

## 2021-05-13 DIAGNOSIS — I63413 Cerebral infarction due to embolism of bilateral middle cerebral arteries: Secondary | ICD-10-CM | POA: Diagnosis not present

## 2021-05-13 DIAGNOSIS — R9431 Abnormal electrocardiogram [ECG] [EKG]: Secondary | ICD-10-CM | POA: Diagnosis not present

## 2021-05-13 DIAGNOSIS — Z791 Long term (current) use of non-steroidal anti-inflammatories (NSAID): Secondary | ICD-10-CM

## 2021-05-13 DIAGNOSIS — R531 Weakness: Secondary | ICD-10-CM | POA: Diagnosis present

## 2021-05-13 LAB — CBC
HCT: 41.2 % (ref 36.0–46.0)
HCT: 38.9 % (ref 36.0–46.0)
Hemoglobin: 13.7 g/dL (ref 12.0–15.0)
Hemoglobin: 12.5 g/dL (ref 12.0–15.0)
MCH: 29.6 pg (ref 26.0–34.0)
MCH: 28.7 pg (ref 26.0–34.0)
MCHC: 33.3 g/dL (ref 30.0–36.0)
MCHC: 32.1 g/dL (ref 30.0–36.0)
MCV: 89 fL (ref 80.0–100.0)
MCV: 89.2 fL (ref 80.0–100.0)
Platelets: 378 10*3/uL (ref 150–400)
Platelets: 336 10*3/uL (ref 150–400)
RBC: 4.63 MIL/uL (ref 3.87–5.11)
RBC: 4.36 MIL/uL (ref 3.87–5.11)
RDW: 14.1 % (ref 11.5–15.5)
RDW: 14.1 % (ref 11.5–15.5)
WBC: 21.9 10*3/uL — ABNORMAL HIGH (ref 4.0–10.5)
WBC: 19.4 10*3/uL — ABNORMAL HIGH (ref 4.0–10.5)
nRBC: 0 % (ref 0.0–0.2)
nRBC: 0 % (ref 0.0–0.2)

## 2021-05-13 LAB — RAPID URINE DRUG SCREEN, HOSP PERFORMED
Amphetamines: NOT DETECTED
Barbiturates: NOT DETECTED
Benzodiazepines: NOT DETECTED
Cocaine: NOT DETECTED
Opiates: NOT DETECTED
Tetrahydrocannabinol: NOT DETECTED

## 2021-05-13 LAB — URINALYSIS, ROUTINE W REFLEX MICROSCOPIC
Bilirubin Urine: NEGATIVE
Glucose, UA: NEGATIVE mg/dL
Hgb urine dipstick: NEGATIVE
Ketones, ur: NEGATIVE mg/dL
Leukocytes,Ua: NEGATIVE
Nitrite: NEGATIVE
Protein, ur: NEGATIVE mg/dL
Specific Gravity, Urine: >1.046 — ABNORMAL HIGH (ref 1.005–1.030)
pH: 6 (ref 5.0–8.0)

## 2021-05-13 LAB — COMPREHENSIVE METABOLIC PANEL
ALT: 22 U/L (ref 0–44)
AST: 23 U/L (ref 15–41)
Albumin: 4 g/dL (ref 3.5–5.0)
Alkaline Phosphatase: 40 U/L (ref 38–126)
Anion gap: 11 (ref 5–15)
BUN: 24 mg/dL — ABNORMAL HIGH (ref 8–23)
CO2: 24 mmol/L (ref 22–32)
Calcium: 9.4 mg/dL (ref 8.9–10.3)
Chloride: 102 mmol/L (ref 98–111)
Creatinine, Ser: 1.16 mg/dL — ABNORMAL HIGH (ref 0.44–1.00)
GFR, Estimated: 53 mL/min — ABNORMAL LOW (ref >60)
Glucose, Bld: 162 mg/dL — ABNORMAL HIGH (ref 70–99)
Potassium: 4.3 mmol/L (ref 3.5–5.1)
Sodium: 137 mmol/L (ref 135–145)
Total Bilirubin: 0.4 mg/dL (ref 0.3–1.2)
Total Protein: 7.9 g/dL (ref 6.5–8.1)

## 2021-05-13 LAB — ETHANOL: Alcohol, Ethyl (B): <10 mg/dL (ref <10)

## 2021-05-13 LAB — PROTIME-INR
INR: 1.1 (ref 0.8–1.2)
Prothrombin Time: 14.3 seconds (ref 11.4–15.2)

## 2021-05-13 LAB — DIFFERENTIAL
Abs Immature Granulocytes: 0.13 10*3/uL — ABNORMAL HIGH (ref 0.00–0.07)
Basophils Absolute: 0 10*3/uL (ref 0.0–0.1)
Basophils Relative: 0 %
Eosinophils Absolute: 0.3 10*3/uL (ref 0.0–0.5)
Eosinophils Relative: 2 %
Immature Granulocytes: 1 %
Lymphocytes Relative: 3 %
Lymphs Abs: 0.8 10*3/uL (ref 0.7–4.0)
Monocytes Absolute: 0.4 10*3/uL (ref 0.1–1.0)
Monocytes Relative: 2 %
Neutro Abs: 20.2 10*3/uL — ABNORMAL HIGH (ref 1.7–7.7)
Neutrophils Relative %: 92 %

## 2021-05-13 LAB — CBG MONITORING, ED: Glucose-Capillary: 165 mg/dL — ABNORMAL HIGH (ref 70–99)

## 2021-05-13 LAB — RESP PANEL BY RT-PCR (FLU A&B, COVID) ARPGX2
Influenza A by PCR: NEGATIVE
Influenza B by PCR: NEGATIVE
SARS Coronavirus 2 by RT PCR: NEGATIVE

## 2021-05-13 LAB — TSH: TSH: 2.876 u[IU]/mL (ref 0.350–4.500)

## 2021-05-13 LAB — HIV ANTIBODY (ROUTINE TESTING W REFLEX): HIV Screen 4th Generation wRfx: NONREACTIVE

## 2021-05-13 LAB — APTT: aPTT: 29 seconds (ref 24–36)

## 2021-05-13 MED ORDER — LEVOTHYROXINE SODIUM 112 MCG PO TABS
112.0000 ug | ORAL_TABLET | Freq: Every day | ORAL | Status: DC
  Administered 2021-05-14 – 2021-05-17 (×4): 112 ug via ORAL
  Filled 2021-05-13 (×4): qty 1

## 2021-05-13 MED ORDER — ATORVASTATIN CALCIUM 40 MG PO TABS
40.0000 mg | ORAL_TABLET | Freq: Every day | ORAL | Status: DC
  Administered 2021-05-14 – 2021-05-17 (×3): 40 mg via ORAL
  Filled 2021-05-13 (×3): qty 1

## 2021-05-13 MED ORDER — IOHEXOL 350 MG/ML SOLN
100.0000 mL | Freq: Once | INTRAVENOUS | Status: AC | PRN
  Administered 2021-05-13: 100 mL via INTRAVENOUS

## 2021-05-13 MED ORDER — SODIUM CHLORIDE 0.9 % IV SOLN: INTRAVENOUS | Status: DC

## 2021-05-13 MED ORDER — INSULIN ASPART 100 UNIT/ML IJ SOLN
0.0000 [IU] | Freq: Three times a day (TID) | INTRAMUSCULAR | Status: DC
  Administered 2021-05-14 – 2021-05-16 (×5): 1 [IU] via SUBCUTANEOUS
  Administered 2021-05-16: 2 [IU] via SUBCUTANEOUS
  Administered 2021-05-17: 1 [IU] via SUBCUTANEOUS
  Filled 2021-05-13: qty 0.09

## 2021-05-13 MED ORDER — ENOXAPARIN SODIUM 40 MG/0.4ML IJ SOSY
40.0000 mg | PREFILLED_SYRINGE | INTRAMUSCULAR | Status: DC
  Administered 2021-05-13 – 2021-05-16 (×4): 40 mg via SUBCUTANEOUS
  Filled 2021-05-13 (×4): qty 0.4

## 2021-05-13 MED ORDER — FENOFIBRATE 160 MG PO TABS
160.0000 mg | ORAL_TABLET | Freq: Every day | ORAL | Status: DC
  Administered 2021-05-14 – 2021-05-17 (×3): 160 mg via ORAL
  Filled 2021-05-13 (×3): qty 1

## 2021-05-13 MED ORDER — ACETAMINOPHEN 325 MG PO TABS
650.0000 mg | ORAL_TABLET | Freq: Four times a day (QID) | ORAL | Status: DC | PRN
  Administered 2021-05-13 – 2021-05-15 (×4): 650 mg via ORAL
  Filled 2021-05-13 (×4): qty 2

## 2021-05-13 MED ORDER — PANTOPRAZOLE SODIUM 40 MG PO TBEC
40.0000 mg | DELAYED_RELEASE_TABLET | Freq: Every day | ORAL | Status: DC
  Administered 2021-05-14 – 2021-05-17 (×3): 40 mg via ORAL
  Filled 2021-05-13 (×3): qty 1

## 2021-05-13 MED ORDER — LEVOTHYROXINE SODIUM 100 MCG/5ML IV SOLN: 50.0000 ug | Freq: Every day | INTRAVENOUS | Status: DC

## 2021-05-13 MED ORDER — ACETAMINOPHEN 325 MG PO TABS
650.0000 mg | ORAL_TABLET | Freq: Once | ORAL | Status: AC
  Administered 2021-05-13: 650 mg via ORAL
  Filled 2021-05-13: qty 2

## 2021-05-13 NOTE — Progress Notes (Signed)
Transfer report received from [facility] by Maryjean Ka., RN. Ready to receive patient.

## 2021-05-13 NOTE — Evaluation (Signed)
SLP Cancellation Note  Patient Details Name: TANAYA DUNIGAN MRN: [id number] DOB: 1956-07-14   Cancelled treatment:       Reason Eval/Treat Not Completed: Other (comment) (Order for swallow eval received, pt passed [facility] swallow screen today at approx 1600.  Please order SLP for speech/language evaluation if indicated.  SLP spoke to MD re: pt's [facility] screen results.   Thanks.)  Rolena Infante, MS [facility] SLP Acute Rehab Services Office [phone] Pager [phone]   Chales Abrahams 05/13/2021, 7:05 PM

## 2021-05-13 NOTE — ED Provider Notes (Signed)
[facility]-EMERGENCY DEPT Provider Note   CSN: [id number] Arrival date & time: 05/13/21  1438     History Chief Complaint  Patient presents with   Aphasia        Weakness    Tiffany Navarro is a 65 y.o. female.  HPI    65 year old female with history of hypertension, diabetes comes in a chief complaint of weakness, balance issues.  Patient was last normal around 4 PM yesterday.  Patient states that yesterday evening she started noticing that she was having some balance issues.  She was walking and favoring to the right side.  This morning she started noticing that she was having difficulty using things like her cell phone and primary was able to get in touch with family.  When patient's son called her, she appeared slurred and they brought her into the ER.  Patient denies any numbness or tingling.  She feels that her vision is also not quite up to par.  No history of strokes.  Past Medical History:  Diagnosis Date   Hypertension    Hypothyroidism    Osteoarthritis    Thyroid disease     Patient Active Problem List   Diagnosis Date Noted   Diabetes (HCC) 03/23/2019   Numbness 02/16/2018   Parathyroid disease (HCC) 12/23/2016   S/P total knee replacement 02/17/2016   Chronic pain of both knees 01/09/2016   Hypertriglyceridemia 05/30/2014   Hypothyroidism 01/22/2014   Essential hypertension, benign 01/22/2014   Osteoarthritis of left knee 01/22/2014    Past Surgical History:  Procedure Laterality Date   BACK SURGERY  82   HIP PINNING Left 2012   x3   pin removed last surgery   JOINT REPLACEMENT     TOTAL KNEE ARTHROPLASTY Left 02/17/2016   Procedure: TOTAL KNEE ARTHROPLASTY;  Surgeon: Dannielle Huh, MD;  Location: [facility] OR;  Service: Orthopedics;  Laterality: Left;   TOTAL KNEE ARTHROPLASTY Right 05/11/2016   Procedure: TOTAL KNEE ARTHROPLASTY;  Surgeon: Dannielle Huh, MD;  Location: [facility];  Service: Orthopedics;  Laterality: Right;     OB  History   No obstetric history on file.     Family History  Problem Relation Age of Onset   Hypertension Mother    Diabetes Mother    Hypertension Father    Stroke Father    Diabetes Father    Hypertension Brother    Diabetes Brother    Thyroid disease Neg Hx     Social History   Tobacco Use   Smoking status: Some Days    Packs/day: 0.25    Years: 40.00    Pack years: 10.00    Types: Cigarettes   Smokeless tobacco: Never  Substance Use Topics   Alcohol use: No   Drug use: No    Comment: 2 cig /day qod    Home Medications Prior to Admission medications   Medication Sig Start Date End Date Taking? Authorizing Provider  Blood Glucose Monitoring Suppl (FREESTYLE LITE) DEVI Use four times a day as needed DX E11.9, E10.9, Z79.4 08/26/20   Romero Belling, MD  Cholecalciferol (VITAMIN D3) 1.25 MG (50000 UT) CAPS Take 1 capsule by mouth once a week. 06/10/20   [provider]  FreeStyle Unistick II Lancets MISC Use four times a day as needed DX E11.9, E10.9, Z79.4 08/26/20   Romero Belling, MD  gemfibrozil (LOPID) 600 MG tablet Take 600 mg by mouth 2 (two) times daily. 06/24/20   [provider]  glucose  blood (CONTOUR NEXT TEST) test strip 1 each 4 (four) times daily by Other route. And lancets 2/day Patient taking differently: 1 each by Other route 3 (three) times daily. TID 10/18/17   Romero Belling, MD  glucose blood (FREESTYLE LITE) test strip Use four times a day as needed DX E11.9, E10.9, Z79.4 08/26/20   Romero Belling, MD  hydrochlorothiazide (HYDRODIURIL) 25 MG tablet Take 25 mg by mouth daily.    [provider]  Insulin Glargine (BASAGLAR KWIKPEN) 100 UNIT/ML Inject 72 Units into the skin every morning. 08/22/20   Romero Belling, MD  Insulin Pen Needle 32G X 4 MM MISC by Does not apply route.    [provider]  levothyroxine (SYNTHROID) 125 MCG tablet TAKE 1 TABLET (125 MCG TOTAL) BY MOUTH DAILY BEFORE BREAKFAST. 11/27/20   Romero Belling, MD   Melatonin 5 MG TABS Take 5 mg by mouth at bedtime.    [provider]  meloxicam (MOBIC) 15 MG tablet Take 15 mg by mouth daily as needed for pain.     [provider]  pantoprazole (PROTONIX) 40 MG tablet Take 40 mg by mouth daily.    [provider]  pregabalin (LYRICA) 50 MG capsule Take 50 mg by mouth 2 (two) times daily.    [provider]  sucralfate (CARAFATE) 1 g tablet Take 1 g by mouth 2 (two) times daily.    [provider]  traMADol (ULTRAM) 50 MG tablet Take 50 mg by mouth 2 (two) times daily.     [provider]  Vitamin D, Ergocalciferol, (DRISDOL) 50000 units CAPS capsule TAKE 1 CAPSULE (50,000 UNITS TOTAL) BY MOUTH 3 (THREE) TIMES A WEEK. 05/12/18   Romero Belling, MD    Allergies    Patient has no known allergies.  Review of Systems   Review of Systems  Constitutional:  Positive for activity change.  Eyes:  Positive for visual disturbance.  Respiratory:  Negative for shortness of breath.   Cardiovascular:  Negative for chest pain.  Neurological:  Positive for dizziness, speech difficulty and light-headedness. Negative for syncope.  All other systems reviewed and are negative.  Physical Exam Updated Vital Signs BP 125/71 (BP Location: Right Arm)   Pulse 93   Temp 98.7 F (37.1 C) (Oral)   Resp 20   SpO2 100%   Physical Exam Vitals and nursing note reviewed.  Constitutional:      Appearance: She is well-developed.  HENT:     Head: Atraumatic.  Cardiovascular:     Rate and Rhythm: Normal rate.  Pulmonary:     Effort: Pulmonary effort is normal.  Musculoskeletal:     Cervical back: Normal range of motion and neck supple.  Skin:    General: Skin is warm and dry.  Neurological:     Mental Status: She is alert and oriented to person, place, and time.     Comments: Left upper extremity and left lower extremity drift Left hemineglect -visual  No sensory deficits Finger-nose, cerebellar exam did reveal  dysmetria    ED Results / Procedures / Treatments   Labs (all labs ordered are listed, but only abnormal results are displayed) Labs Reviewed  CBG MONITORING, ED - Abnormal; Notable for the following components:      Result Value   Glucose-Capillary 165 (*)    All other components within normal limits  RESP PANEL BY RT-PCR (FLU A&B, COVID) ARPGX2  ETHANOL  PROTIME-INR  APTT  CBC  DIFFERENTIAL  COMPREHENSIVE METABOLIC PANEL  RAPID URINE DRUG SCREEN, HOSP PERFORMED  URINALYSIS, ROUTINE W REFLEX MICROSCOPIC    EKG None  Radiology No results found.  Procedures Procedures   Medications Ordered in ED Medications  iohexol (OMNIPAQUE) 350 MG/ML injection 100 mL (100 mLs Intravenous Contrast Given 05/13/21 1521)    ED Course  I have reviewed the triage vital signs and the nursing notes.  Pertinent labs & imaging results that were available during my care of the patient were reviewed by me and considered in my medical decision making (see chart for details).    MDM Rules/Calculators/A&P                          65 year old comes in w/ chief complaint of dizziness, slurred speech.  She is also noted to have left upper and lower extremity hemimotor deficit.  Questionable LVO, as we were having patient read, there was clear left-sided visual field deficit.  Given that there is left upper extremity weakness and additional visual discrepancy, LVO stroke is possible.  Code stroke was activated.  CT scan does reveal parietal occipital infarct. No LVO however.  Patient will be admitted to the hospital. Family made aware.  Final Clinical Impression(s) / ED Diagnoses Final diagnoses:  Acute ischemic stroke [facility])    Rx / DC Orders ED Discharge Orders     None        Derwood Kaplan, MD 05/14/21 1614

## 2021-05-13 NOTE — H&P (Addendum)
History and Physical    Tiffany Navarro:[id number] DOB: 11/06/1956 DOA: 05/13/2021  PCP: Marletta Lor, NP Patient coming from: Home  Chief Complaint: Right-sided weakness, generalized weakness, aphasia, unsteady gait HPI: Tiffany Navarro is a 65 y.o. female with medical history significant of type 1 diabetes, hypertension, hyperlipidemia, osteoarthritis, hypothyroidism admitted with complaints of generalized weakness.  On the day of admission she noticed that her right upper extremity was weak and she was not able to hold a cup or to pick up her toast. She complains of neck pain all the way down to her sacral area throughout her spine.  She denied any fever chills cough nausea vomiting diarrhea.  She complains of a headache. She was seen by her family normal last on 4 PM 05/12/2021.  She was out of tPA window. She reports having balance issues and unsteady gait and thought she was going to fall. Family noticed that she had slurring of speech at home. Her son works as a Office manager in the [employer].  ED Course: CT head showed parietal occipital infarct.  Review of Systems: As per HPI otherwise all other systems reviewed and are negative  Ambulatory Status: Ambulatory at baseline.  Past Medical History:  Diagnosis Date   Hypertension    Hypothyroidism    Osteoarthritis    Thyroid disease     Past Surgical History:  Procedure Laterality Date   BACK SURGERY  77   HIP PINNING Left 2012   x3   pin removed last surgery   JOINT REPLACEMENT     TOTAL KNEE ARTHROPLASTY Left 02/17/2016   Procedure: TOTAL KNEE ARTHROPLASTY;  Surgeon: Dannielle Huh, MD;  Location: [facility] OR;  Service: Orthopedics;  Laterality: Left;   TOTAL KNEE ARTHROPLASTY Right 05/11/2016   Procedure: TOTAL KNEE ARTHROPLASTY;  Surgeon: Dannielle Huh, MD;  Location: [facility] OR;  Service: Orthopedics;  Laterality: Right;    Social History   Socioeconomic History   Marital status: Widowed    Spouse name: Not on file   Number of  children: Not on file   Years of education: Not on file   Highest education level: Not on file  Occupational History   Not on file  Tobacco Use   Smoking status: Some Days    Packs/day: 0.25    Years: 40.00    Pack years: 10.00    Types: Cigarettes   Smokeless tobacco: Never  Substance and Sexual Activity   Alcohol use: No   Drug use: No    Comment: 2 cig /day qod   Sexual activity: Not on file  Other Topics Concern   Not on file  Social History Narrative   Not on file   Social Determinants of Health   Financial Resource Strain: Not on file  Food Insecurity: Not on file  Transportation Needs: Not on file  Physical Activity: Not on file  Stress: Not on file  Social Connections: Not on file  Intimate Partner Violence: Not on file    No Known Allergies  Family History  Problem Relation Age of Onset   Hypertension Mother    Diabetes Mother    Hypertension Father    Stroke Father    Diabetes Father    Hypertension Brother    Diabetes Brother    Thyroid disease Neg Hx       Prior to Admission medications   Medication Sig Start Date End Date Taking? Authorizing Provider  atorvastatin (LIPITOR) 20 MG tablet Take 1 tablet by mouth every  evening. 03/01/21  Yes [provider]  Cholecalciferol (VITAMIN D3) 1.25 MG (50000 UT) CAPS Take 1 capsule by mouth once a week. 06/10/20  Yes [provider]  fenofibrate 160 MG tablet Take 160 mg by mouth daily. 03/25/21  Yes [provider]  hydrochlorothiazide (HYDRODIURIL) 25 MG tablet Take 25 mg by mouth daily.   Yes [provider]  Insulin Glargine (BASAGLAR KWIKPEN) 100 UNIT/ML Inject 72 Units into the skin every morning. Patient taking differently: Inject 68 Units into the skin every morning. 08/22/20  Yes Romero Belling, MD  levothyroxine (SYNTHROID) 112 MCG tablet Take 112 mcg by mouth daily. 05/09/21  Yes [provider]  lisinopril (ZESTRIL) 5 MG tablet Take 5 mg by mouth daily. 03/06/21   Yes [provider]  Melatonin 5 MG TABS Take 5 mg by mouth at bedtime.   Yes [provider]  meloxicam (MOBIC) 15 MG tablet Take 15 mg by mouth daily.   Yes [provider]  pantoprazole (PROTONIX) 40 MG tablet Take 40 mg by mouth daily.   Yes [provider]  pregabalin (LYRICA) 50 MG capsule Take 50 mg by mouth 3 (three) times daily.   Yes [provider]  traMADol (ULTRAM) 50 MG tablet Take 50 mg by mouth in the morning, at noon, and at bedtime.   Yes [provider]  Vitamin D, Ergocalciferol, (DRISDOL) 50000 units CAPS capsule TAKE 1 CAPSULE (50,000 UNITS TOTAL) BY MOUTH 3 (THREE) TIMES A WEEK. 05/12/18  Yes Romero Belling, MD  Blood Glucose Monitoring Suppl (FREESTYLE LITE) DEVI Use four times a day as needed DX E11.9, E10.9, Z79.4 08/26/20   Romero Belling, MD  FreeStyle Unistick II Lancets MISC Use four times a day as needed DX E11.9, E10.9, Z79.4 08/26/20   Romero Belling, MD  glucose blood (CONTOUR NEXT TEST) test strip 1 each 4 (four) times daily by Other route. And lancets 2/day Patient taking differently: 1 each by Other route 3 (three) times daily. TID 10/18/17   Romero Belling, MD  glucose blood (FREESTYLE LITE) test strip Use four times a day as needed DX E11.9, E10.9, Z79.4 08/26/20   Romero Belling, MD  Insulin Pen Needle 32G X 4 MM MISC by Does not apply route.    [provider]  levothyroxine (SYNTHROID) 125 MCG tablet TAKE 1 TABLET (125 MCG TOTAL) BY MOUTH DAILY BEFORE BREAKFAST. Patient not taking: No sig reported 11/27/20   Romero Belling, MD    Physical Exam: Vitals:   05/13/21 1450 05/13/21 1500 05/13/21 1618 05/13/21 1701  BP: 125/71 125/73 (!) 154/101 (!) 167/84  Pulse:  94 80 82  Resp:  12 16 16   Temp:      TempSrc:      SpO2:  97% 100% 100%     General: Appears calm and comfortable Eyes:  PERRL, EOMI, normal lids, iris ENT:  grossly normal hearing, lips & tongue, mmm Neck:  no LAD, masses or  thyromegaly Cardiovascular:  RRR, no m/r/g. No LE edema.  Respiratory:  CTA bilaterally, no w/r/r. Normal respiratory effort. Abdomen:  soft, ntnd, NABS Skin:  no rash or induration seen on limited exam Musculoskeletal:  grossly normal tone BUE/BLE, good ROM, no bony abnormality Psychiatric: Depressed affect Neurologic: Is awake alert oriented x3.  Right upper extremity weakness more than left speech not slurred while speaking to me Labs on Admission: I have personally reviewed following labs and imaging studies  CBC: Recent Labs  Lab 05/13/21 1458  WBC 21.9*  NEUTROABS 20.2*  HGB 13.7  HCT 41.2  MCV 89.0  PLT 378   Basic Metabolic Panel: Recent Labs  Lab 05/13/21 1458  NA 137  K 4.3  CL 102  CO2 24  GLUCOSE 162*  BUN 24*  CREATININE 1.16*  CALCIUM 9.4   GFR: CrCl cannot be calculated (Unknown ideal weight.). Liver Function Tests: Recent Labs  Lab 05/13/21 1458  AST 23  ALT 22  ALKPHOS 40  BILITOT 0.4  PROT 7.9  ALBUMIN 4.0   No results for input(s): LIPASE, AMYLASE in the last 168 hours. No results for input(s): AMMONIA in the last 168 hours. Coagulation Profile: Recent Labs  Lab 05/13/21 1458  INR 1.1   Cardiac Enzymes: No results for input(s): CKTOTAL, CKMB, CKMBINDEX, TROPONINI in the last 168 hours. BNP (last 3 results) No results for input(s): PROBNP in the last 8760 hours. HbA1C: No results for input(s): HGBA1C in the last 72 hours. CBG: Recent Labs  Lab 05/13/21 1447  GLUCAP 165*   Lipid Profile: No results for input(s): CHOL, HDL, LDLCALC, TRIG, CHOLHDL, LDLDIRECT in the last 72 hours. Thyroid Function Tests: No results for input(s): TSH, T4TOTAL, FREET4, T3FREE, THYROIDAB in the last 72 hours. Anemia Panel: No results for input(s): VITAMINB12, FOLATE, FERRITIN, TIBC, IRON, RETICCTPCT in the last 72 hours. Urine analysis:    Component Value Date/Time   COLORURINE YELLOW 05/13/2021 1458   APPEARANCEUR CLEAR 05/13/2021 1458    LABSPEC >1.046 (H) 05/13/2021 1458   PHURINE 6.0 05/13/2021 1458   GLUCOSEU NEGATIVE 05/13/2021 1458   HGBUR NEGATIVE 05/13/2021 1458   BILIRUBINUR NEGATIVE 05/13/2021 1458   KETONESUR NEGATIVE 05/13/2021 1458   PROTEINUR NEGATIVE 05/13/2021 1458   UROBILINOGEN 0.2 07/26/2014 1115   NITRITE NEGATIVE 05/13/2021 1458   LEUKOCYTESUR NEGATIVE 05/13/2021 1458    Creatinine Clearance: CrCl cannot be calculated (Unknown ideal weight.).  Sepsis Labs: @LABRCNTIP (procalcitonin:4,lacticidven:4) ) Recent Results (from the past 240 hour(s))  Resp Panel by RT-PCR (Flu A&B, Covid) Nasopharyngeal Swab     Status: None   Collection Time: 05/13/21  3:51 PM   Specimen: Nasopharyngeal Swab; Nasopharyngeal(NP) swabs in vial transport medium  Result Value Ref Range Status   SARS Coronavirus 2 by RT PCR NEGATIVE NEGATIVE Final    Comment: (NOTE) SARS-CoV-2 target nucleic acids are NOT DETECTED.  The SARS-CoV-2 RNA is generally detectable in upper respiratory specimens during the acute phase of infection. The lowest concentration of SARS-CoV-2 viral copies this assay can detect is 138 copies/mL. A negative result does not preclude SARS-Cov-2 infection and should not be used as the sole basis for treatment or other patient management decisions. A negative result may occur with  improper specimen collection/handling, submission of specimen other than nasopharyngeal swab, presence of viral mutation(s) within the areas targeted by this assay, and inadequate number of viral copies(<138 copies/mL). A negative result must be combined with clinical observations, patient history, and epidemiological information. The expected result is Negative.  Fact Sheet for Patients:  BloggerCourse.comhttps://www.fda.gov/media/152166/download  Fact Sheet for Healthcare Providers:  SeriousBroker.ithttps://www.fda.gov/media/152162/download  This test is no t yet approved or cleared by the [place] FDA and  has been authorized for detection  and/or diagnosis of SARS-CoV-2 by FDA under an Emergency Use Authorization (EUA). This EUA will remain  in effect (meaning this test can be used) for the duration of the COVID-19 declaration under Section 564(b)(1) of the Act, 21 U.S.C.section 360bbb-3(b)(1), unless the authorization is terminated  or revoked sooner.       Influenza A  by PCR NEGATIVE NEGATIVE Final   Influenza B by PCR NEGATIVE NEGATIVE Final    Comment: (NOTE) The Xpert Xpress SARS-CoV-2/FLU/RSV plus assay is intended as an aid in the diagnosis of influenza from Nasopharyngeal swab specimens and should not be used as a sole basis for treatment. Nasal washings and aspirates are unacceptable for Xpert Xpress SARS-CoV-2/FLU/RSV testing.  Fact Sheet for Patients: BloggerCourse.com  Fact Sheet for Healthcare Providers: SeriousBroker.it  This test is not yet approved or cleared by the [place] FDA and has been authorized for detection and/or diagnosis of SARS-CoV-2 by FDA under an Emergency Use Authorization (EUA). This EUA will remain in effect (meaning this test can be used) for the duration of the COVID-19 declaration under Section 564(b)(1) of the Act, 21 U.S.C. section 360bbb-3(b)(1), unless the authorization is terminated or revoked.  Performed at [facility], [address], [place], [place] [address]      Radiological Exams on Admission: CT HEAD CODE STROKE WO CONTRAST  Result Date: 05/13/2021 CLINICAL DATA:  Weakness EXAM: CT ANGIOGRAPHY HEAD AND NECK CT PERFUSION BRAIN TECHNIQUE: Multidetector CT imaging of the head and neck was performed using the standard protocol during bolus administration of intravenous contrast. Multiplanar CT image reconstructions and MIPs were obtained to evaluate the vascular anatomy. Carotid stenosis measurements (when applicable) are obtained utilizing NASCET criteria, using the distal internal carotid  diameter as the denominator. Multiphase CT imaging of the brain was performed following IV bolus contrast injection. Subsequent parametric perfusion maps were calculated using RAPID software. CONTRAST:  OMNIPAQUE IOHEXOL 350 MG/ML SOLN COMPARISON:  None. FINDINGS: CT HEAD Brain: There is no acute intracranial hemorrhage or mass effect. Small area of hypoattenuation with loss of gray-white differentiation in the right parietal lobe possibly with some occipital extension. There is no extra-axial fluid collection. Ventricles and sulci are within normal limits in size and configuration. Vascular: No hyperdense vessel. Skull: Unremarkable. Sinuses/Orbits: No acute finding. Other: None. ASPECTS [facility] Stroke Program Early CT Score) - Ganglionic level infarction (caudate, lentiform nuclei, internal capsule, insula, M1-M3 cortex): 7 - Supraganglionic infarction (M4-M6 cortex): 2 Total score (0-10 with 10 being normal): 9 Review of the MIP images confirms the above findings CTA NECK Aortic arch: No significant plaque along the aortic arch. Great vessel origins are patent. Right carotid system: There is mixed but primarily noncalcified plaque at the bifurcation and proximal internal carotid causing 75% stenosis. Left carotid system: Patent. Trace calcified plaque at the ICA origin without stenosis. Vertebral arteries: Patent. Right vertebral artery is dominant. Minimal calcified plaque along the right V2 segment. No stenosis. Skeleton: Degenerative changes of the included spine. Other neck: Unremarkable. Upper chest: No apical lung mass. Review of the MIP images confirms the above findings CTA HEAD Anterior circulation: Intracranial internal carotid arteries are patent with mild calcified plaque. Proximal anterior and middle cerebral arteries are patent. Posterior circulation: Intracranial vertebral arteries are patent. Left vertebral artery becomes diminutive after PICA origin. Basilar artery is patent. Major  cerebellar artery origins are patent. Posterior cerebral arteries are patent. Venous sinuses: Patent as allowed by contrast bolus timing. Review of the MIP images confirms the above findings CT Brain Perfusion Findings: CBF (<30%) Volume: 18mL Perfusion (Tmax>6.0s) volume: 8mL Mismatch Volume: 97mL Infarction Location:Right parieto-occipital lobes corresponding to head CT finding IMPRESSION: Small acute infarction of the right parietooccipital lobes. There is no proximal intracranial vessel occlusion. 75% stenosis at the right ICA origin is noted and this may reflect a distal embolic or MCA/PCA  watershed infarct. Perfusion imaging demonstrates 8 mL core infarction and 3 mL of penumbra in the right parieto-occipital lobes corresponding to noncontrast head CT abnormality. These results were called by telephone at the time of interpretation on 05/13/2021 at 3:44 pm to provider [facility] , who verbally acknowledged these results. Electronically Signed   By: Guadlupe Spanish M.D.   On: 05/13/2021 15:57   CT ANGIO HEAD NECK W WO CM W PERF (CODE STROKE)  Result Date: 05/13/2021 CLINICAL DATA:  Weakness EXAM: CT ANGIOGRAPHY HEAD AND NECK CT PERFUSION BRAIN TECHNIQUE: Multidetector CT imaging of the head and neck was performed using the standard protocol during bolus administration of intravenous contrast. Multiplanar CT image reconstructions and MIPs were obtained to evaluate the vascular anatomy. Carotid stenosis measurements (when applicable) are obtained utilizing NASCET criteria, using the distal internal carotid diameter as the denominator. Multiphase CT imaging of the brain was performed following IV bolus contrast injection. Subsequent parametric perfusion maps were calculated using RAPID software. CONTRAST:  OMNIPAQUE IOHEXOL 350 MG/ML SOLN COMPARISON:  None. FINDINGS: CT HEAD Brain: There is no acute intracranial hemorrhage or mass effect. Small area of hypoattenuation with loss of gray-white  differentiation in the right parietal lobe possibly with some occipital extension. There is no extra-axial fluid collection. Ventricles and sulci are within normal limits in size and configuration. Vascular: No hyperdense vessel. Skull: Unremarkable. Sinuses/Orbits: No acute finding. Other: None. ASPECTS [facility] Stroke Program Early CT Score) - Ganglionic level infarction (caudate, lentiform nuclei, internal capsule, insula, M1-M3 cortex): 7 - Supraganglionic infarction (M4-M6 cortex): 2 Total score (0-10 with 10 being normal): 9 Review of the MIP images confirms the above findings CTA NECK Aortic arch: No significant plaque along the aortic arch. Great vessel origins are patent. Right carotid system: There is mixed but primarily noncalcified plaque at the bifurcation and proximal internal carotid causing 75% stenosis. Left carotid system: Patent. Trace calcified plaque at the ICA origin without stenosis. Vertebral arteries: Patent. Right vertebral artery is dominant. Minimal calcified plaque along the right V2 segment. No stenosis. Skeleton: Degenerative changes of the included spine. Other neck: Unremarkable. Upper chest: No apical lung mass. Review of the MIP images confirms the above findings CTA HEAD Anterior circulation: Intracranial internal carotid arteries are patent with mild calcified plaque. Proximal anterior and middle cerebral arteries are patent. Posterior circulation: Intracranial vertebral arteries are patent. Left vertebral artery becomes diminutive after PICA origin. Basilar artery is patent. Major cerebellar artery origins are patent. Posterior cerebral arteries are patent. Venous sinuses: Patent as allowed by contrast bolus timing. Review of the MIP images confirms the above findings CT Brain Perfusion Findings: CBF (<30%) Volume: 8mL Perfusion (Tmax>6.0s) volume: 5mL Mismatch Volume: 3mL Infarction Location:Right parieto-occipital lobes corresponding to head CT finding IMPRESSION: Small acute  infarction of the right parietooccipital lobes. There is no proximal intracranial vessel occlusion. 75% stenosis at the right ICA origin is noted and this may reflect a distal embolic or MCA/PCA watershed infarct. Perfusion imaging demonstrates 8 mL core infarction and 3 mL of penumbra in the right parieto-occipital lobes corresponding to noncontrast head CT abnormality. These results were called by telephone at the time of interpretation on 05/13/2021 at 3:44 pm to provider [facility] , who verbally acknowledged these results. Electronically Signed   By: Guadlupe Spanish M.D.   On: 05/13/2021 15:57    EKG:   Assessment/Plan Active Problems:   * No active hospital problems. *    #1 acute right parietal occipital stroke-patient will be  admitted to [facility].  Seen by neurologist via tele medicine. CT angiogram shows 75% stenosis of the right ICA origin this may reflect a distal embolic or MCA PCA watershed infarct.  Perfusion images demonstrate 8 mL core infarction and 3 mL of penumbra in the right parieto-occipital lobes corresponding to a noncontrast head CT abnormality. To me she is weaker in the right upper extremity than the left upper extremity. (I saw her outside of MRI at [facility] waiting to get MRI done.) Will check echo, TSH, hemoglobin A1c, lipid panel, PT OT and speech consults. She passed bed side swallow..start po meds. Neuro following. IV fluids 75 cc an hour normal saline since she is n.p.o.  #2 type 1 diabetes we will place her on sliding scale insulin.  Hold glargine insulin she takes at home.  #3 hypothyroidism check TSH.continue synthroid.  #4 leukocytosis blood cultures done follow-up cultures.  Follow-up labs in AM.  Concern for endocarditis.  #5 hyperlipidemia lipid panel ordered.  On Lipitor fenofibrate at home.  Restart once she is able to take p.o.   #6 history of essential hypertension on lisinopril  #7 diabetic neuropathy on Lyrica and Ultram  Estimated body mass  index is 30.79 kg/m as calculated from the following:   Height as of 08/22/20: 5\' 5"  (1.651 m).   Weight as of 08/22/20: 83.9 kg.   DVT prophylaxis: lovenox Code Status: Full code Family Communication: dw son Disposition Plan:  home  Consults called:  neuro Admission status:  in patient    08/24/20 MD [facility]  If 7PM-7AM, please contact night-coverage [email or web address] Password [facility]  05/13/2021, 5:32 PM

## 2021-05-13 NOTE — ED Triage Notes (Signed)
Pt complains of slurred speech and weakness since last night.

## 2021-05-13 NOTE — ED Notes (Signed)
Patient to MRI via stretcher.

## 2021-05-13 NOTE — Consult Note (Signed)
[facility] Telemedicine Consult   Requesting Provider: Derwood Kaplan Consult Participants: [facility] nurse Georgiann Cocker, myself, patient, bedside nurse  Location of the provider: [facility] Location of the patient: [facility]  This consult was provided via telemedicine with 2-way video and audio communication. The patient/family was informed that care would be provided in this way and agreed to receive care in this manner.   Chief Complaint: Generalized weakness and diffuse joint pain  HPI: This is a 65 year old woman with a past medical history significant for type 1 diabetes, hypertension, hyperlipidemia, hypothyroidism, osteoarthritis.  She reports a couple of days of reduced energy leading to poor appetite.  She was last seen well by family at around 4 PM on 6/14.  Around 7 PM she felt like she had severe worsening of her baseline arthritis and generalized weakness.  She did not sleep well secondary to her severe pain.  This morning she was so weak that she was unable to pick up toast, even with her right hand.  On arrival to the ED she was noted to have concern for left-sided neglect and left-sided weakness more than right, as well as seeing on the left for which code stroke was activated.  She did not notice any focal weakness herself until she came to the ED  LKW: 4 PM on 6/13  tpa given?: No, out of the window IR Thrombectomy? No, no LVO Modified Rankin Scale: 0-Completely asymptomatic and back to baseline post- stroke Time of teleneurologist evaluation: Connected at 3:10 PM, patient not available until 3:27 as they were in the scanner  Past Medical History:  Diagnosis Date   Hypertension    Hypothyroidism    Osteoarthritis    Thyroid disease   - Type 1 diabetes - Hyperlipidemia  Past Surgical History:  Procedure Laterality Date   BACK SURGERY  70   HIP PINNING Left 2012   x3   pin removed last surgery   JOINT REPLACEMENT     TOTAL KNEE ARTHROPLASTY Left  02/17/2016   Procedure: TOTAL KNEE ARTHROPLASTY;  Surgeon: Dannielle Huh, MD;  Location: [facility] OR;  Service: Orthopedics;  Laterality: Left;   TOTAL KNEE ARTHROPLASTY Right 05/11/2016   Procedure: TOTAL KNEE ARTHROPLASTY;  Surgeon: Dannielle Huh, MD;  Location: [facility] OR;  Service: Orthopedics;  Laterality: Right;   Current Outpatient Medications  Medication Instructions   Basaglar KwikPen 72 Units, Subcutaneous, BH-each morning   Blood Glucose Monitoring Suppl (FREESTYLE LITE) DEVI Use four times a day as needed DX E11.9, E10.9, Z79.4   Cholecalciferol (VITAMIN D3) 1.25 MG (50000 UT) CAPS 1 capsule, Oral, Weekly   FreeStyle Unistick II Lancets MISC Use four times a day as needed DX E11.9, E10.9, Z79.4   gemfibrozil (LOPID) 600 mg, Oral, 2 times daily   glucose blood (CONTOUR NEXT TEST) test strip 1 each, Other, 4 times daily, And lancets 2/day   glucose blood (FREESTYLE LITE) test strip Use four times a day as needed DX E11.9, E10.9, Z79.4   hydrochlorothiazide (HYDRODIURIL) 25 mg, Oral, Daily   Insulin Pen Needle 32G X 4 MM MISC Does not apply   levothyroxine (SYNTHROID) 125 mcg, Oral, Daily before breakfast   melatonin 5 mg, Oral, Daily at bedtime   meloxicam (MOBIC) 15 mg, Oral, Daily PRN   pantoprazole (PROTONIX) 40 mg, Oral, Daily   pregabalin (LYRICA) 50 mg, Oral, 2 times daily   sucralfate (CARAFATE) 1 g, Oral, 2 times daily   traMADol (ULTRAM) 50 mg, Oral, 2 times daily  Vitamin D, Ergocalciferol, (DRISDOL) 50000 units CAPS capsule TAKE 1 CAPSULE (50,000 UNITS TOTAL) BY MOUTH 3 (THREE) TIMES A WEEK.   Exam: Vitals:   05/13/21 1446 05/13/21 1450  BP:  125/71  Pulse: 93   Resp: 20   Temp: 98.7 F (37.1 C)   SpO2: 100%    General: Pale and fatigued Pulmonary: breathing comfortably Cardiac: regular rate and rhythm on monitor   NIH Stroke scale 1A: Level of Consciousness - 0 1B: Ask Month and Age - 0 1C: 'Blink Eyes' & 'Squeeze Hands' - 0 2: Test Horizontal Extraocular Movements -  0 3: Test Visual Fields - 1 4: Test Facial Palsy - 0 symmetric activation, possibly slight left nasolabial fold flattening at baseline but this appears chronic given its visible on her EMR picture 5A: Test Left Arm Motor Drift - 1 5B: Test Right Arm Motor Drift - 0 6A: Test Left Leg Motor Drift - 1 6B: Test Right Leg Motor Drift - 0 7: Test Limb Ataxia - 0 --- she has some abnormal movements that look concerning for asterixis in the left upper extremity but this is not likely true ataxia 8: Test Sensation - 0 9: Test Language/Aphasia- 0  10: Test Dysarthria - 1 11: Test Extinction/Inattention - 1 possible subtle leg neglect of the left side versus simple visual field deficit NIHSS score: 4   Imaging Reviewed:  Head CT personally reviewed, w/ hypodensity in the left parietal/visible lobe CTA/CT perfusion demonstrating minimal additional tissue at risk beyond the hypodensity already seen on head CT; no large vessel occlusion though there is significant right carotid stenosis (75% stenosis at the ICA origin)  Labs reviewed in epic and pertinent values follow: Leukocytosis to 21.9 Baseline creatinine 0.8 currently 1.16 with a BUN of 24  Assessment: This is a 65 year old woman with past medical history significant for type 1 diabetes, hypertension, hyperlipidemia, presenting with left parietal/visible stroke in the setting of a couple days of worsening fatigue and reduced appetite with diffuse joint pain and a brisk leukocytosis and mild AKI concerning for infectious process.  Stroke may be in the setting of cardioembolic phenomenon (endocarditis) versus symptomatic right internal carotid artery stenosis.    Recommendations:  - Blood cultures (ordered) - MRI brain w/o contrast (ordered) - Hold antiplatelet for now given possibility of endocarditis - Stroke labs (hemoglobin A1c, lipid panel, both ordered; UA and UDS pending) - Permissive hypertension overnight, given leukocytosis and concern  for potential infection, aggressive IV fluids may be beneficial (defer to primary team to order) - Telemetry monitoring - PT consult, OT consult, Speech consult, - Please admit to [facility] for further stroke work-up, stroke team will follow in consultation  This patient is receiving care for possible acute neurological changes. There was >55 minutes of care by this provider at the time of service, including time for direct evaluation via telemedicine, review of medical records, imaging studies and discussion of findings with providers, the patient and/or family.  Brooke Dare MD-PhD [facility] [phone] If 8pm-8am, please page neurology on call as listed in AMION.

## 2021-05-14 ENCOUNTER — Inpatient Hospital Stay (HOSPITAL_COMMUNITY): Payer: Medicare HMO

## 2021-05-14 ENCOUNTER — Encounter (HOSPITAL_COMMUNITY): Payer: Self-pay | Admitting: Internal Medicine

## 2021-05-14 DIAGNOSIS — I6521 Occlusion and stenosis of right carotid artery: Secondary | ICD-10-CM

## 2021-05-14 DIAGNOSIS — R9431 Abnormal electrocardiogram [ECG] [EKG]: Secondary | ICD-10-CM

## 2021-05-14 LAB — ECHOCARDIOGRAM COMPLETE
Area-P 1/2: 3.21 cm2
Height: 65 in
S' Lateral: 2.4 cm
Weight: 3093.49 oz

## 2021-05-14 LAB — COMPREHENSIVE METABOLIC PANEL
ALT: 21 U/L (ref 0–44)
AST: 18 U/L (ref 15–41)
Albumin: 3.4 g/dL — ABNORMAL LOW (ref 3.5–5.0)
Alkaline Phosphatase: 33 U/L — ABNORMAL LOW (ref 38–126)
Anion gap: 11 (ref 5–15)
BUN: 17 mg/dL (ref 8–23)
CO2: 25 mmol/L (ref 22–32)
Calcium: 9.1 mg/dL (ref 8.9–10.3)
Chloride: 100 mmol/L (ref 98–111)
Creatinine, Ser: 1.06 mg/dL — ABNORMAL HIGH (ref 0.44–1.00)
GFR, Estimated: 59 mL/min — ABNORMAL LOW (ref >60)
Glucose, Bld: 99 mg/dL (ref 70–99)
Potassium: 3.4 mmol/L — ABNORMAL LOW (ref 3.5–5.1)
Sodium: 136 mmol/L (ref 135–145)
Total Bilirubin: 0.6 mg/dL (ref 0.3–1.2)
Total Protein: 6.5 g/dL (ref 6.5–8.1)

## 2021-05-14 LAB — GLUCOSE, CAPILLARY
Glucose-Capillary: 113 mg/dL — ABNORMAL HIGH (ref 70–99)
Glucose-Capillary: 105 mg/dL — ABNORMAL HIGH (ref 70–99)
Glucose-Capillary: 134 mg/dL — ABNORMAL HIGH (ref 70–99)
Glucose-Capillary: 148 mg/dL — ABNORMAL HIGH (ref 70–99)

## 2021-05-14 LAB — LIPID PANEL
Cholesterol: 103 mg/dL (ref 0–200)
HDL: 44 mg/dL (ref >40)
LDL Cholesterol: 35 mg/dL (ref 0–99)
Total CHOL/HDL Ratio: 2.3 RATIO
Triglycerides: 119 mg/dL (ref <150)
VLDL: 24 mg/dL (ref 0–40)

## 2021-05-14 LAB — CBC
HCT: 38.5 % (ref 36.0–46.0)
Hemoglobin: 12.4 g/dL (ref 12.0–15.0)
MCH: 28.6 pg (ref 26.0–34.0)
MCHC: 32.2 g/dL (ref 30.0–36.0)
MCV: 88.9 fL (ref 80.0–100.0)
Platelets: 308 10*3/uL (ref 150–400)
RBC: 4.33 MIL/uL (ref 3.87–5.11)
RDW: 14.1 % (ref 11.5–15.5)
WBC: 13.2 10*3/uL — ABNORMAL HIGH (ref 4.0–10.5)
nRBC: 0 % (ref 0.0–0.2)

## 2021-05-14 LAB — PROTIME-INR
INR: 1.2 (ref 0.8–1.2)
Prothrombin Time: 15.1 seconds (ref 11.4–15.2)

## 2021-05-14 LAB — HEMOGLOBIN A1C
Hgb A1c MFr Bld: 7.1 % — ABNORMAL HIGH (ref 4.8–5.6)
Mean Plasma Glucose: 157 mg/dL

## 2021-05-14 LAB — MAGNESIUM: Magnesium: 2.1 mg/dL (ref 1.7–2.4)

## 2021-05-14 MED ORDER — LORAZEPAM 0.5 MG PO TABS
0.5000 mg | ORAL_TABLET | Freq: Once | ORAL | Status: AC | PRN
  Administered 2021-05-14: 0.5 mg via ORAL
  Filled 2021-05-14: qty 1

## 2021-05-14 MED ORDER — POTASSIUM CHLORIDE CRYS ER 20 MEQ PO TBCR
40.0000 meq | EXTENDED_RELEASE_TABLET | Freq: Once | ORAL | Status: AC
  Administered 2021-05-14: 40 meq via ORAL
  Filled 2021-05-14: qty 2

## 2021-05-14 MED ORDER — ASPIRIN 300 MG RE SUPP
300.0000 mg | Freq: Every day | RECTAL | Status: DC
  Filled 2021-05-14: qty 1

## 2021-05-14 MED ORDER — MELATONIN 5 MG PO TABS
5.0000 mg | ORAL_TABLET | Freq: Every day | ORAL | Status: DC
  Administered 2021-05-14 – 2021-05-16 (×3): 5 mg via ORAL
  Filled 2021-05-14 (×3): qty 1

## 2021-05-14 MED ORDER — PREGABALIN 25 MG PO CAPS
50.0000 mg | ORAL_CAPSULE | Freq: Three times a day (TID) | ORAL | Status: DC
  Administered 2021-05-14 – 2021-05-17 (×7): 50 mg via ORAL
  Filled 2021-05-14: qty 1
  Filled 2021-05-14: qty 2
  Filled 2021-05-14: qty 1
  Filled 2021-05-14: qty 2
  Filled 2021-05-14 (×2): qty 1
  Filled 2021-05-14: qty 2

## 2021-05-14 MED ORDER — CLOPIDOGREL BISULFATE 75 MG PO TABS
75.0000 mg | ORAL_TABLET | Freq: Every day | ORAL | Status: DC
  Administered 2021-05-14 – 2021-05-17 (×4): 75 mg via ORAL
  Filled 2021-05-14 (×4): qty 1

## 2021-05-14 MED ORDER — WITCH HAZEL-GLYCERIN EX PADS
MEDICATED_PAD | CUTANEOUS | Status: DC | PRN
  Filled 2021-05-14: qty 100

## 2021-05-14 MED ORDER — SODIUM CHLORIDE 0.9 % IV SOLN: INTRAVENOUS | Status: DC

## 2021-05-14 MED ORDER — TRAMADOL HCL 50 MG PO TABS
50.0000 mg | ORAL_TABLET | Freq: Three times a day (TID) | ORAL | Status: DC | PRN
  Administered 2021-05-14 – 2021-05-17 (×6): 50 mg via ORAL
  Filled 2021-05-14 (×6): qty 1

## 2021-05-14 MED ORDER — ASPIRIN EC 325 MG PO TBEC
325.0000 mg | DELAYED_RELEASE_TABLET | Freq: Every day | ORAL | Status: DC
  Administered 2021-05-14: 325 mg via ORAL
  Filled 2021-05-14: qty 1

## 2021-05-14 NOTE — Progress Notes (Signed)
Chat with MD patient med list states she takes Melatonin 5mg  HS and Tramadol 50 mg 3 times daily and Lyica 50 mg 3 times daily po and that patient is asking for these medication she feels she maybe going through withdrawals

## 2021-05-14 NOTE — Evaluation (Signed)
Occupational Therapy Evaluation Patient Details Name: Tiffany Navarro MRN: [id number] DOB: Jun 26, 1956 Today's Date: 05/14/2021    History of Present Illness 65 y.o. female with medical history significant of type 1 diabetes, hypertension, hyperlipidemia, osteoarthritis, hypothyroidism admitted with complaints of generalized weakness.  Now complains of L sided weakness and L visual field cut.   Clinical Impression   Patient admitted with the diagnosis above.  MRI suggests: Acute cortical/subcortical infarcts of the right frontal, parietal, and occipital lobes.  PTA she lived with her daughter in an upstairs in-law apartment, and was independent with self care, mobility, drove and helped care for grand kids.  Barriers are listed below.  Currently she is needing up to Min A for basic mobility and min Guard for ADL completion at a seated level.  OT to continue efforts in the acute setting to maximize functional status, but her preference is to return home with her son, and home health services as needed.      Follow Up Recommendations  Home health OT    Equipment Recommendations  Tub/shower seat    Recommendations for Other Services       Precautions / Restrictions Precautions Precautions: Fall Restrictions Weight Bearing Restrictions: No      Mobility Bed Mobility Overal bed mobility: Needs Assistance Bed Mobility: Supine to Sit     Supine to sit: Supervision       Patient Response: Anxious;Impulsive  Transfers Overall transfer level: Needs assistance Equipment used: Rolling walker (2 wheeled) Transfers: Sit to/from [employer] Sit to Stand: Min guard Stand pivot transfers: Min guard;Min assist            Balance Overall balance assessment: Needs assistance Sitting-balance support: Feet supported Sitting balance-Leahy Scale: Good     Standing balance support: Bilateral upper extremity supported Standing balance-Leahy Scale: Poor Standing  balance comment: needing external support - sensation of losing her balance forward.                           ADL either performed or assessed with clinical judgement   ADL Overall ADL's : Needs assistance/impaired     Grooming: Wash/dry hands;Wash/dry face;Set up;Sitting           Upper Body Dressing : Supervision/safety;Sitting   Lower Body Dressing: Min guard;Sitting/lateral leans   Toilet Transfer: Min guard;RW;Ambulation           Functional mobility during ADLs: Cueing for safety;Min guard;Rolling walker       Vision   Vision Assessment?: Yes Ocular Range of Motion: Within Functional Limits Alignment/Gaze Preference: Within Defined Limits Saccades: Within functional limits Visual Fields: Left homonymous hemianopsia     Perception     Praxis Praxis Praxis-Other Comments: back pain and arthritic pains.    Pertinent Vitals/Pain Pain Assessment: 0-10 Pain Score: 3  Pain Location: low back Pain Descriptors / Indicators: Aching;Pins and needles Pain Intervention(s): Monitored during session     Hand Dominance Right   Extremity/Trunk Assessment Upper Extremity Assessment Upper Extremity Assessment: LUE deficits/detail LUE Deficits / Details: weakness noted to L UE, grossly 3+5 MMT. LUE Sensation: WNL LUE Coordination: WNL   Lower Extremity Assessment Lower Extremity Assessment: Defer to PT evaluation   Cervical / Trunk Assessment Cervical / Trunk Assessment: Normal   Communication Communication Communication: No difficulties   Cognition Arousal/Alertness: Awake/alert Behavior During Therapy: Impulsive;Anxious Overall Cognitive Status: Impaired/Different from baseline Area of Impairment: Problem solving;Memory  Memory: Decreased short-term memory       Problem Solving: Slow processing;Difficulty sequencing;Requires verbal cues     General Comments       Exercises     Shoulder Instructions       Home Living Family/patient expects to be discharged to:: Private residence Living Arrangements: Children Available Help at Discharge: Family;Available 24 hours/day Type of Home: House Home Access: Level entry     Home Layout: Two level;Able to live on main level with bedroom/bathroom     Bathroom Shower/Tub: Chief Strategy Officer: Standard Bathroom Accessibility: Yes How Accessible: Accessible via walker Home Equipment: Shower seat          Prior Functioning/Environment Level of Independence: Independent        Comments: patient states initial symptoms were R sided weakness and L visual field cut - approximately 2 weeks ago for initial onset.        OT Problem List: Decreased strength;Decreased range of motion;Impaired balance (sitting and/or standing);Impaired vision/perception;Decreased safety awareness;Impaired UE functional use      OT Treatment/Interventions: Self-care/ADL training;Therapeutic exercise;Balance training;Therapeutic activities    OT Goals(Current goals can be found in the care plan section) Acute Rehab OT Goals Patient Stated Goal: return to son's home for a short while. OT Goal Formulation: With patient Time For Goal Achievement: 05/28/21 Potential to Achieve Goals: Good ADL Goals Pt Will Perform Grooming: with modified independence;sitting;standing Pt Will Perform Lower Body Bathing: with modified independence;sit to/from stand Pt Will Perform Lower Body Dressing: with modified independence;sit to/from stand Pt Will Transfer to Toilet: with modified independence;regular height toilet;ambulating Pt Will Perform Toileting - Clothing Manipulation and hygiene: sit to/from stand;with modified independence Additional ADL Goal #1: Patient will scan her L visual field without VC's to maximize ADL and toileting independence.  OT Frequency: Min 2X/week   Barriers to D/C:    staying with son and DIL post acute       Co-evaluation               AM-PAC OT "6 Clicks" Daily Activity     Outcome Measure Help from another person eating meals?: None Help from another person taking care of personal grooming?: None Help from another person toileting, which includes using toliet, bedpan, or urinal?: A Little Help from another person bathing (including washing, rinsing, drying)?: A Little Help from another person to put on and taking off regular upper body clothing?: None Help from another person to put on and taking off regular lower body clothing?: A Little 6 Click Score: 21   End of Session Equipment Utilized During Treatment: Gait belt;Rolling walker Nurse Communication: Mobility status  Activity Tolerance: Patient tolerated treatment well Patient left: in bed;with call bell/phone within reach;with family/visitor present  OT Visit Diagnosis: Unsteadiness on feet (R26.81);Muscle weakness (generalized) (M62.81);Hemiplegia and hemiparesis Hemiplegia - Right/Left: Left Hemiplegia - dominant/non-dominant: Non-Dominant                Time: 7124-5809 OT Time Calculation (min): 20 min Charges:  OT General Charges $OT Visit: 1 Visit OT Evaluation $OT Eval Moderate Complexity: 1 Mod  05/14/2021  Rich, OTR/L  Acute Rehabilitation Services  Office:  [phone]   Tiffany Navarro 05/14/2021, 10:44 AM

## 2021-05-14 NOTE — Progress Notes (Addendum)
STROKE TEAM PROGRESS NOTE   ATTENDING NOTE: I reviewed above note and agree with the assessment and plan. Pt was seen and examined.   65 year old female with history of diabetes hypertension hyperlipidemia admitted for left-sided weakness and slurred speech.  Per patient and son, symptoms much improved from yesterday and 2 days ago.  Currently speech much improved and left arm weakness are mild, left leg weakness resolved.  CT showed right parieto-occipital infarct.  CTA head and neck right ICA bulb 75% stenosis.  MRI showed right MCA scattered infarct as well as right MCA/ACA and right MCA/PCA watershed infarcts.  EF 60 to 65%.  LDL 35, A1c pending.  Patient admitted intermittent nicotine vaping.  UDS negative.  Creatinine 1.06.  Patient did have leukocytosis on presentation, WBC 21.9-> 19.4-> 13.2. However, no fever.  Blood culture pending.   On exam, son at bedside, patient awake alert, oriented x3, no aphasia, no dysarthria, follows simple commands, able to name and repeat.  Visual fields full, no gaze palsy, facial symmetrical.  Left upper extremity mild pronator drift, finger grip 3+/5.  Left lower extremity 5/5.  Right upper and lower extremities 5/5.  Sensation symmetrical, finger-to-nose intact bilaterally, however slower on the left side.  Gait not tested.  Etiology for patient stroke likely due to left ICA bulb high-grade stenosis.  Will recommend vascular surgery consultation to consider CTA versus TCAR.  Although patient has leukocytosis but improving and no fever or other systemic signs of endocarditis, considered possibility of endocarditis is low.  Will recommend aspirin 325 and Plavix 75 DAPT, continue Lipitor and fenofibrate. BP goal 130-160 before carotid revascularization due to left ICA stenosis.  Currently BP on the low side, recommend IV fluid.  Quit nicotine vaping, education provided.  We will follow.   Marvel Plan, MD PhD Stroke Neurology 05/14/2021 5:47 PM    Interval  History  No acute events overnight, patient just wrapped up a session with physical therapy.   Patient's son Tiffany Navarro is at bedside. Patient and son are knowledgeable about stroke and cause; R carotid stenosis. Patient and sons questions were all answered.     Pertinent Lab Work and Imaging    CT Head WO IV Contrast Small acute infarction of the right parietooccipital lobes. There is no proximal intracranial vessel occlusion. 75% stenosis at the right ICA origin is noted and this may reflect a distal embolic or MCA/PCA watershed infarct.   CT Angio Head and Neck W WO IV Contrast CTA NECK   Aortic arch: No significant plaque along the aortic arch. Great vessel origins are patent.   Right carotid system: There is mixed but primarily noncalcified plaque at the bifurcation and proximal internal carotid causing 75% stenosis.   Left carotid system: Patent. Trace calcified plaque at the ICA origin without stenosis.   Vertebral arteries: Patent. Right vertebral artery is dominant. Minimal calcified plaque along the right V2 segment. No stenosis.   Skeleton: Degenerative changes of the included spine.   Other neck: Unremarkable.   Upper chest: No apical lung mass.   CTA HEAD   Anterior circulation: Intracranial internal carotid arteries are patent with mild calcified plaque. Proximal anterior and middle cerebral arteries are patent.   Posterior circulation: Intracranial vertebral arteries are patent. Left vertebral artery becomes diminutive after PICA origin. Basilar artery is patent. Major cerebellar artery origins are patent. Posterior cerebral arteries are patent.   Venous sinuses: Patent as allowed by contrast bolus timing.  MRI Brain WO IV Contrast Acute cortical/subcortical infarcts  of the right frontal, parietal, and occipital lobes. Minor petechial hemorrhage in the right parieto-occipital region. Minor chronic microvascular ischemic changes.  Echocardiogram Complete   1. Left  ventricular ejection fraction, by estimation, is 60 to 65%. The left ventricle has normal function. The left ventricle has no regional wall motion abnormalities. Left ventricular diastolic parameters were normal.   2. Right ventricular systolic function is normal. The right ventricular size is normal. Tricuspid regurgitation signal is inadequate for assessing PA pressure.   3. The mitral valve is normal in structure. No evidence of mitral valve regurgitation. No evidence of mitral stenosis.   4. The aortic valve is tricuspid. Aortic valve regurgitation is not visualized. No aortic stenosis is present.   Physical Examination   Constitutional: Calm, appropriate for condition  Cardiovascular: Normal RR Respiratory: No increased WOB   Mental status: AAOx4, following commands  Speech: Fluent with repetition and naming intact. No dysarthria  Cranial nerves: EOMI, Left inferior quadrantanopsia, Face symmetric, Tongue midline, Shoulder shrug intact   Motor: Normal bulk and tone. Pronator drift noted to the left arm. Give way weakness noted while doing strength exam in the setting of pain- she reports arthritis in her arms/shoulders   Dlt Bic Tri FgS Grp HF  KnF KnE PIF DoF  R 4 4 4 5  5 5 5 5 5   L 5 5 5 5 4 5 5 5 5 5   Sensory: Intact to light tough throughout  Coordination: Intact FNF  Gait: Deferred   NIHSS: 1 for inferior quadrantanopsia   Assessment and Plan   Tiffany Navarro is a 65 y.o. female w/pmh of type 1 diabetes, hypertension, hyperlipidemia, hypothyroidism, osteoarthritis who presents with generalized weakness found to have focal left sided weakness on arrival to the ED for which a code stroke was initiated. She was outside of the time window for IVTPA and not eligible for thrombectomy due to lack of LVO.   #R Frontal, Parietal Occipital Stroke in the setting of symptomatic carotid stenosis  Patient presents with the symptoms described above. MRI Brain revealed right frontal,  parietal occipital stroke. Vessel imaging w/CTA Head and Neck was pertinent for right carotid stenosis- 75 %. Echo w/EF 60 to 65 % LA normal in size. Stroke labs w/LDL uncalculated due to severely elevated triglycerides. Hemoglobin A1C pending. Her stroke is in the setting of atherosclerosis. - Consult Vascular Surgery for intervention  - Continue Aspirin 325 mg QD + Atorvastatin 40 mg for secondary stroke prevention  -At discharge please place ambulatory referral to neurology for stroke follow up   #Hypertension She has a history of HTN and takes HCTZ 25 mg QD + Lisinopril 5 mg QD at home. Will allow for liberal blood pressure up to 160 given carotid stenosis.  - Do not initiate home BP medications at this time   #Hyperlipidemia From a stroke prevention stand point, the LDL goal is < 70. LDL unable to be calculated. Given carotid stenosis her home Atorvastatin 20 was increased to 40 mg.  - Repeat Lipid panel on an outpatient basis in the future to evaluate LDL level   #Type 1 Diabetes  Hemoglobin A1C this admission pending. Recommend SSI for management of diabetes inpatient.   Hospital day # 1  [name], NP  [facility] Nurse Practitioner Patient seen and discussed with attending physician Dr. [id number]    To contact Stroke Continuity provider, please refer to [place]. After hours, contact General Neurology

## 2021-05-14 NOTE — Progress Notes (Signed)
[facility] notified that patient appeared to have anxiety while at bedside report. New orders received will continue to monitor Ilean Skill LPN

## 2021-05-14 NOTE — Progress Notes (Signed)
  Echocardiogram 2D Echocardiogram has been performed.  Tiffany Navarro Tiffany Navarro 05/14/2021, 9:45 AM

## 2021-05-14 NOTE — Evaluation (Signed)
Physical Therapy Evaluation Patient Details Name: Tiffany Navarro MRN: [id number] DOB: 20-Aug-1956 Today's Date: 05/14/2021   History of Present Illness  65 y.o. female with medical history significant of type 1 diabetes, hypertension, hyperlipidemia, osteoarthritis, hypothyroidism admitted with complaints of generalized weakness.  Now complains of L sided weakness and L visual field cut.  Clinical Impression  Pt presents to PT with deficits in awareness, vision, balance, gait, functional mobility, memory, strength. Pt with mild L weakness compared to R side. Pt is able to mobilize but benefits from use of RW to improve stability, as well as assistance to provide cueing due to L vision and attention deficits. Pt often bumping into  objects when mobilizing, however no losses of balance noted during evaluation. PT provides education to family on promoting L attention and scanning to L side to aide in improving awareness. PT recommends discharge with a RW and assistance for all out of bed mobility. PT also recommends outpatient neuro PT follow-up at the time of discharge.    Follow Up Recommendations Outpatient PT;Supervision for mobility/OOB    Equipment Recommendations  Rolling walker with 5" wheels    Recommendations for Other Services       Precautions / Restrictions Precautions Precautions: Fall Precaution Comments: L hemianopia and neglect Restrictions Weight Bearing Restrictions: No      Mobility  Bed Mobility Overal bed mobility: Needs Assistance Bed Mobility: Supine to Sit     Supine to sit: Supervision     General bed mobility comments: increased time    Transfers Overall transfer level: Needs assistance Equipment used: Rolling walker (2 wheeled) Transfers: Sit to/from [employer] Sit to Stand: Min guard Stand pivot transfers: Min guard;Min assist       General transfer comment: pt requires cueing to bring L hand onto  RW  Ambulation/Gait Ambulation/Gait assistance: Min guard Gait Distance (Feet): 300 Feet Assistive device: Rolling walker (2 wheeled) Gait Pattern/deviations: Step-through pattern Gait velocity: functional Gait velocity interpretation: 1.31 - 2.62 ft/sec, indicative of limited community ambulator General Gait Details: pt with step-through gait, bumping into multiple objects during ambulation largely due to L hemianopia and L neglect. Pt with difficulty scanning to L side to identify signage and other items upon PT request. Pt requires intermittent cueing to regain grip of RW.  Stairs            Wheelchair Mobility    Modified Rankin (Stroke Patients Only) Modified Rankin (Stroke Patients Only) Pre-Morbid Rankin Score: No symptoms Modified Rankin: Moderately severe disability     Balance Overall balance assessment: Needs assistance Sitting-balance support: Feet supported Sitting balance-Leahy Scale: Good     Standing balance support: Bilateral upper extremity supported Standing balance-Leahy Scale: Poor Standing balance comment: needing external support - sensation of losing her balance forward.                             Pertinent Vitals/Pain Pain Assessment: 0-10 Pain Score: 3  Faces Pain Scale: Hurts even more Pain Location: low back Pain Descriptors / Indicators: Aching;Pins and needles Pain Intervention(s): Monitored during session    Home Living Family/patient expects to be discharged to:: Private residence Living Arrangements: Children Available Help at Discharge: Family;Available 24 hours/day Type of Home: House Home Access: Level entry Entrance Stairs-Rails: None Entrance Stairs-Number of Steps: 1 Home Layout: Two level;Able to live on main level with bedroom/bathroom Home Equipment: Shower seat      Prior Function Level of  Independence: Independent         Comments: patient states initial symptoms were R sided weakness and L visual  field cut - approximately 2 weeks ago for initial onset.     Hand Dominance   Dominant Hand: Right    Extremity/Trunk Assessment   Upper Extremity Assessment Upper Extremity Assessment: LUE deficits/detail LUE Deficits / Details: weakness noted to L UE, grossly 3+5 MMT. LUE Sensation: WNL LUE Coordination: WNL    Lower Extremity Assessment Lower Extremity Assessment: Defer to PT evaluation LLE Deficits / Details: grossly 4/5 strength, proprioception grossly intact    Cervical / Trunk Assessment Cervical / Trunk Assessment: Normal  Communication   Communication: No difficulties  Cognition Arousal/Alertness: Awake/alert Behavior During Therapy: Impulsive;Anxious Overall Cognitive Status: Impaired/Different from baseline Area of Impairment: Problem solving;Memory                     Memory: Decreased short-term memory   Safety/Judgement: Decreased awareness of deficits Awareness: Emergent Problem Solving: Slow processing;Difficulty sequencing;Requires verbal cues General Comments: limited awareness of L side due to L neglect and vision deficits      General Comments General comments (skin integrity, edema, etc.): VSS on RA    Exercises     Assessment/Plan    PT Assessment Patient needs continued PT services  PT Problem List Decreased strength;Decreased balance;Decreased activity tolerance;Decreased cognition;Decreased mobility;Decreased knowledge of use of DME;Decreased safety awareness;Decreased knowledge of precautions;Other (comment) (impaired vision)       PT Treatment Interventions DME instruction;Gait training;Stair training;Functional mobility training;Therapeutic activities;Therapeutic exercise;Neuromuscular re-education;Balance training;Patient/family education    PT Goals (Current goals can be found in the Care Plan section)  Acute Rehab PT Goals Patient Stated Goal: return to son's home for a short while. PT Goal Formulation: With patient Time  For Goal Achievement: 05/28/21 Potential to Achieve Goals: Good Additional Goals Additional Goal #1: Pt will demonstrate the ability to identify 75% of objects on left side when mobilizing to demonstrate improved awareness and attention to left side.    Frequency Min 4X/week   Barriers to discharge        Co-evaluation               AM-PAC PT "6 Clicks" Mobility  Outcome Measure Help needed turning from your back to your side while in a flat bed without using bedrails?: None Help needed moving from lying on your back to sitting on the side of a flat bed without using bedrails?: None Help needed moving to and from a bed to a chair (including a wheelchair)?: A Little Help needed standing up from a chair using your arms (e.g., wheelchair or bedside chair)?: A Little Help needed to walk in hospital room?: A Little Help needed climbing 3-5 steps with a railing? : A Little 6 Click Score: 20    End of Session Equipment Utilized During Treatment: Gait belt Activity Tolerance: Patient tolerated treatment well Patient left: in bed;with call bell/phone within reach;with bed alarm set;with family/visitor present Nurse Communication: Mobility status PT Visit Diagnosis: Other abnormalities of gait and mobility (R26.89);Other symptoms and signs involving the nervous system (R29.898)    Time: 1004-1030 PT Time Calculation (min) (ACUTE ONLY): 26 min   Charges:   PT Evaluation $PT Eval Low Complexity: 1 Low          Arlyss Gandy, PT, DPT Acute Rehabilitation Pager: [phone]   Arlyss Gandy 05/14/2021, 10:47 AM

## 2021-05-14 NOTE — TOC Initial Note (Signed)
Transition of Care [facility]) - Initial/Assessment Note    Patient Details  Name: Tiffany Navarro MRN: [id number] Date of Birth: 1956-11-06  Transition of Care [facility]) CM/SW Contact:    Kermit Balo, RN Phone Number: 05/14/2021, 2:27 PM  Clinical Narrative:                 Patient lives home alone but plans on staying with her son and DIL after d/c. CM inquired about HH vs outpatient and pt asked that I call her son. Pts son prefers outpatient therapy. CM has placed referrals and information on AVS. Walker for home has been delivered to the room per [employer].  TOC following.  Expected Discharge Plan: OP Rehab Barriers to Discharge: Continued Medical Work up   Patient Goals and CMS Choice   CMS Medicare.gov Compare Post Acute Care list provided to:: Patient Represenative (must comment) Choice offered to / list presented to : Adult Children  Expected Discharge Plan and Services Expected Discharge Plan: OP Rehab   Discharge Planning Services: CM Consult   Living arrangements for the past 2 months: Single Family Home                 DME Arranged: Walker rolling DME Agency: [employer] Date DME Agency Contacted: 05/14/21   Representative spoke with at DME Agency: Velna Hatchet            Prior Living Arrangements/Services Living arrangements for the past 2 months: Single Family Home Lives with:: Self Patient language and need for interpreter reviewed:: Yes Do you feel safe going back to the place where you live?: Yes      Need for Family Participation in Patient Care: Yes (Comment) Care giver support system in place?: Yes (comment) Current home services: DME (shower seat) Criminal Activity/Legal Involvement Pertinent to Current Situation/Hospitalization: No - Comment as needed  Activities of Daily Living      Permission Sought/Granted                  Emotional Assessment Appearance:: Appears stated age Attitude/Demeanor/Rapport: Engaged Affect (typically  observed): Accepting Orientation: : Oriented to Self, Oriented to Place, Oriented to  Time, Oriented to Situation   Psych Involvement: No (comment)  Admission diagnosis:  Stroke (cerebrum) (HCC) [I63.9] Acute ischemic stroke [facility]) [I63.9] Patient Active Problem List   Diagnosis Date Noted   Carotid stenosis, right 05/14/2021   Acute embolic stroke (HCC) 05/13/2021   Diabetes (HCC) 03/23/2019   Numbness 02/16/2018   Parathyroid disease (HCC) 12/23/2016   S/P total knee replacement 02/17/2016   Chronic pain of both knees 01/09/2016   Hypertriglyceridemia 05/30/2014   Hypothyroidism 01/22/2014   Essential hypertension, benign 01/22/2014   Osteoarthritis of left knee 01/22/2014   PCP:  Marletta Lor, NP Pharmacy:   [id number] - [place], [place] - [address] [place] [address] Phone: [phone] Fax: [phone]     Social Determinants of Health (SDOH) Interventions    Readmission Risk Interventions No flowsheet data found.
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
## 2021-05-14 NOTE — Progress Notes (Addendum)
Progress Note    [name]  WFU:[id number] DOB: [birth year]  DOA: 05/13/2021 PCP: Marletta Lor, NP      Brief Narrative:    Medical records reviewed and are as summarized below:  Tiffany Navarro is a 65 y.o. female with medical history significant of type 1 diabetes, hypertension, hyperlipidemia, osteoarthritis, hypothyroidism, who presented to the hospital with left-sided weakness, generalized weakness, aphasia and unsteady gait.  She was found to have acute embolic stroke involving the right frontal, parietal and occipital areas.  She also has mild petechiae in the region and right parieto-occipital region and 75% right ICA stenosis.  She was out of the window for tPA.    Assessment/Plan:   Principal Problem:   Acute embolic stroke [facility]) Active Problems:   Carotid stenosis, right    Body mass index is 32.17 kg/m.  (Obesity)   Acute embolic stroke involving the right frontal, parietal and occipital lobes, minor petechial hemorrhage in the right parieto-occipital region, 75% right ICA stenosis: Continue aspirin and Lipitor.  Discontinue IV fluids. Consulted vascular surgeon, Dr. Myra Gianotti, for further evaluation.  Follow-up with neurologist.    2D echo i showed EF estimated at 60 to 65%, normal LV diastolic parameters.  Type I DM, diabetic neuropathy: NovoLog as needed for hyperglycemia.  Glucose levels are okay for now.  Hemoglobin A1c is pending.  Hyperlipidemia:  LDL 35, HDL 44, triglycerides 119, total cholesterol 103.   Continue Lipitor and fenofibrate  Hypokalemia: Replete potassium.  Check magnesium level.  Leukocytosis: This is probably reactive.  It is improving without antibiotics.  Follow-up blood cultures.  Diet Order             Diet Carb Modified Fluid consistency: Thin; Room service appropriate? Yes  Diet effective now                      Consultants: Neurologist Vascular surgeon  Procedures: None    Medications:     aspirin EC  325 mg Oral Daily   Or   aspirin  300 mg Rectal Daily   atorvastatin  40 mg Oral Daily   enoxaparin (LOVENOX) injection  40 mg Subcutaneous Q24H   fenofibrate  160 mg Oral Daily   insulin aspart  0-9 Units Subcutaneous TID WC   levothyroxine  112 mcg Oral Daily   pantoprazole  40 mg Oral Daily   Continuous Infusions:     Anti-infectives (From admission, onward)    None              Family Communication/Anticipated D/C date and plan/Code Status   DVT prophylaxis: enoxaparin (LOVENOX) injection 40 mg Start: 05/13/21 2200 SCDs Start: 05/13/21 1734     Code Status: Full Code  Family Communication: Gregary Signs, son Disposition Plan:    Status is: Inpatient  Remains inpatient appropriate because:IV treatments appropriate due to intensity of illness or inability to take PO  Dispo: The patient is from: Home              Anticipated d/c is to: Home              Patient currently is not medically stable to d/c.   Difficult to place patient No           Subjective:   C/o occipital headache.  No dizziness, unilateral weakness or numbness in the extremities.  No change in speech or vision.  Her son was at the bedside.  Objective:  Vitals:   05/13/21 2320 05/14/21 0449 05/14/21 0816 05/14/21 1111  BP: 100/61 103/77 115/70 134/65  Pulse: 83 74 77 84  Resp: 18 18  18   Temp: 98.9 F (37.2 C) 98 F (36.7 C)  (!) 97.4 F (36.3 C)  TempSrc: Oral Oral  Oral  SpO2: 98% 96% 97% 100%  Weight: 87.7 kg     Height: 5\' 5"  (1.651 m)      No data found.  No intake or output data in the 24 hours ending 05/14/21 1352 Filed Weights   05/13/21 2320  Weight: 87.7 kg    Exam:  GEN: NAD SKIN: No rash EYES: EOMI ENT: MMM CV: RRR PULM: CTA B ABD: soft, ND, NT, +BS CNS: AAO x 3, non focal EXT: No edema or tenderness        Data Reviewed:   I have personally reviewed following labs and imaging studies:  Labs: Labs show the following:    Basic Metabolic Panel: Recent Labs  Lab 05/13/21 1458 05/14/21 0514  NA 137 136  K 4.3 3.4*  CL 102 100  CO2 24 25  GLUCOSE 162* 99  BUN 24* 17  CREATININE 1.16* 1.06*  CALCIUM 9.4 9.1   GFR Estimated Creatinine Clearance: 58.7 mL/min (A) (by C-G formula based on SCr of 1.06 mg/dL (H)). Liver Function Tests: Recent Labs  Lab 05/13/21 1458 05/14/21 0514  AST 23 18  ALT 22 21  ALKPHOS 40 33*  BILITOT 0.4 0.6  PROT 7.9 6.5  ALBUMIN 4.0 3.4*   No results for input(s): LIPASE, AMYLASE in the last 168 hours. No results for input(s): AMMONIA in the last 168 hours. Coagulation profile Recent Labs  Lab 05/13/21 1458 05/14/21 0514  INR 1.1 1.2    CBC: Recent Labs  Lab 05/13/21 1458 05/13/21 1734 05/14/21 0514  WBC 21.9* 19.4* 13.2*  NEUTROABS 20.2*  --   --   HGB 13.7 12.5 12.4  HCT 41.2 38.9 38.5  MCV 89.0 89.2 88.9  PLT 378 336 308   Cardiac Enzymes: No results for input(s): CKTOTAL, CKMB, CKMBINDEX, TROPONINI in the last 168 hours. BNP (last 3 results) No results for input(s): PROBNP in the last 8760 hours. CBG: Recent Labs  Lab 05/13/21 1447 05/14/21 0817 05/14/21 1112  GLUCAP 165* 113* 105*   D-Dimer: No results for input(s): DDIMER in the last 72 hours. Hgb A1c: No results for input(s): HGBA1C in the last 72 hours. Lipid Profile: Recent Labs    05/14/21 0514  CHOL 103  HDL 44  LDLCALC 35  TRIG 119  CHOLHDL 2.3   Thyroid function studies: Recent Labs    05/13/21 1800  TSH 2.876   Anemia work up: No results for input(s): VITAMINB12, FOLATE, FERRITIN, TIBC, IRON, RETICCTPCT in the last 72 hours. Sepsis Labs: Recent Labs  Lab 05/13/21 1458 05/13/21 1734 05/14/21 0514  WBC 21.9* 19.4* 13.2*    Microbiology Recent Results (from the past 240 hour(s))  Resp Panel by RT-PCR (Flu A&B, Covid) Nasopharyngeal Swab     Status: None   Collection Time: 05/13/21  3:51 PM   Specimen: Nasopharyngeal Swab; Nasopharyngeal(NP) swabs in vial  transport medium  Result Value Ref Range Status   SARS Coronavirus 2 by RT PCR NEGATIVE NEGATIVE Final    Comment: (NOTE) SARS-CoV-2 target nucleic acids are NOT DETECTED.  The SARS-CoV-2 RNA is generally detectable in upper respiratory specimens during the acute phase of infection. The lowest concentration of SARS-CoV-2 viral copies this assay can detect is  138 copies/mL. A negative result does not preclude SARS-Cov-2 infection and should not be used as the sole basis for treatment or other patient management decisions. A negative result may occur with  improper specimen collection/handling, submission of specimen other than nasopharyngeal swab, presence of viral mutation(s) within the areas targeted by this assay, and inadequate number of viral copies(<138 copies/mL). A negative result must be combined with clinical observations, patient history, and epidemiological information. The expected result is Negative.  Fact Sheet for Patients:  BloggerCourse.com  Fact Sheet for Healthcare Providers:  SeriousBroker.it  This test is no t yet approved or cleared by the [place] FDA and  has been authorized for detection and/or diagnosis of SARS-CoV-2 by FDA under an Emergency Use Authorization (EUA). This EUA will remain  in effect (meaning this test can be used) for the duration of the COVID-19 declaration under Section 564(b)(1) of the Act, 21 U.S.C.section 360bbb-3(b)(1), unless the authorization is terminated  or revoked sooner.       Influenza A by PCR NEGATIVE NEGATIVE Final   Influenza B by PCR NEGATIVE NEGATIVE Final    Comment: (NOTE) The Xpert Xpress SARS-CoV-2/FLU/RSV plus assay is intended as an aid in the diagnosis of influenza from Nasopharyngeal swab specimens and should not be used as a sole basis for treatment. Nasal washings and aspirates are unacceptable for Xpert Xpress SARS-CoV-2/FLU/RSV testing.  Fact  Sheet for Patients: BloggerCourse.com  Fact Sheet for Healthcare Providers: SeriousBroker.it  This test is not yet approved or cleared by the [place] FDA and has been authorized for detection and/or diagnosis of SARS-CoV-2 by FDA under an Emergency Use Authorization (EUA). This EUA will remain in effect (meaning this test can be used) for the duration of the COVID-19 declaration under Section 564(b)(1) of the Act, 21 U.S.C. section 360bbb-3(b)(1), unless the authorization is terminated or revoked.  Performed at [facility], [address], [place], [place] [address]   Culture, blood (routine x 2)     Status: None (Preliminary result)   Collection Time: 05/13/21  4:11 PM   Specimen: BLOOD  Result Value Ref Range Status   Specimen Description   Final    BLOOD RIGHT ANTECUBITAL Performed at [facility], [address], [place], [place] [address]    Special Requests   Final    BOTTLES DRAWN AEROBIC AND ANAEROBIC Blood Culture adequate volume Performed at [facility], [address] [place], [place] [address]    Culture   Final    NO GROWTH < 12 HOURS Performed at [facility], [address], [place], [place] [address]    Report Status PENDING  Incomplete  Culture, blood (routine x 2)     Status: None (Preliminary result)   Collection Time: 05/13/21  4:16 PM   Specimen: BLOOD  Result Value Ref Range Status   Specimen Description   Final    BLOOD LEFT ANTECUBITAL Performed at [facility], [address], [place], [place] [address]    Special Requests   Final    BOTTLES DRAWN AEROBIC ONLY Blood Culture adequate volume Performed at [facility], [address], [place], [place] [address]    Culture   Final    NO GROWTH < 12 HOURS Performed at [facility], [address], [place], [place] [address]    Report  Status PENDING  Incomplete    Procedures and diagnostic studies:  MR BRAIN WO CONTRAST  Result Date: 05/13/2021 CLINICAL DATA:  Stroke, follow-up EXAM: MRI HEAD WITHOUT CONTRAST TECHNIQUE: Multiplanar, multiecho pulse sequences of the brain and surrounding structures were obtained without intravenous contrast. COMPARISON:  Recent CT imaging FINDINGS: Brain: There are cortical/subcortical areas of restricted diffusion in the right frontal, parietal, and occipital lobes. There is some involvement of the perirolandic region. Minor susceptibility in the right parieto-occipital region likely reflecting petechial hemorrhage. Patchy small foci of T2 hyperintensity in the supratentorial white matter nonspecific but may reflect minor chronic microvascular ischemic changes. Ventricles and sulci are normal in size and configuration. There is no intracranial mass or mass effect. There is no hydrocephalus or extra-axial fluid collection. Vascular: Major vessel flow voids at the skull base are preserved. Skull and upper cervical spine: Normal marrow signal is preserved. Sinuses/Orbits: Trace mucosal thickening.  Orbits are unremarkable. Other: Sella is unremarkable.  Mastoid air cells are clear. IMPRESSION: Acute cortical/subcortical infarcts of the right frontal, parietal, and occipital lobes. Minor petechial hemorrhage in the right parieto-occipital region. Minor chronic microvascular ischemic changes. Electronically Signed   By: Guadlupe Spanish M.D.   On: 05/13/2021 18:13   ECHOCARDIOGRAM COMPLETE  Result Date: 05/14/2021    ECHOCARDIOGRAM REPORT   Patient Name:   Tiffany Navarro Date of Exam: 05/14/2021 Medical Rec #:  [id number]         Height:       65.0 in Accession #:    [id number]        Weight:       193.3 lb Date of Birth:  11-17-1956          BSA:          1.950 m Patient Age:    64 years          BP:           115/70 mmHg Patient Gender: F                 HR:           78 bpm. Exam Location:  Inpatient  Procedure: 2D Echo, Cardiac Doppler and Color Doppler Indications:    R94.31 Abnormal EKG  History:        Patient has no prior history of Echocardiogram examinations.                 Risk Factors:Hypertension. Hypothyroidism.  Sonographer:    Elmarie Shiley Dance Referring Phys: [id number] ELIZABETH G MATHEWS IMPRESSIONS  1. Left ventricular ejection fraction, by estimation, is 60 to 65%. The left ventricle has normal function. The left ventricle has no regional wall motion abnormalities. Left ventricular diastolic parameters were normal.  2. Right ventricular systolic function is normal. The right ventricular size is normal. Tricuspid regurgitation signal is inadequate for assessing PA pressure.  3. The mitral valve is normal in structure. No evidence of mitral valve regurgitation. No evidence of mitral stenosis.  4. The aortic valve is tricuspid. Aortic valve regurgitation is not visualized. No aortic stenosis is present. Comparison(s): No prior Echocardiogram. Conclusion(s)/Recommendation(s): Otherwise normal echocardiogram, with minor abnormalities described in the report. FINDINGS  Left Ventricle: Left ventricular ejection fraction, by estimation, is 60 to 65%. The left ventricle has normal function. The left ventricle has no regional wall motion abnormalities. The left ventricular internal cavity size was normal in size. There is  no left ventricular hypertrophy. Left ventricular diastolic parameters were normal. Right Ventricle: The right ventricular size is normal. No increase in right ventricular wall thickness. Right ventricular systolic function  is normal. Tricuspid regurgitation signal is inadequate for assessing PA pressure. Left Atrium: Left atrial size was normal in size. Right Atrium: Right atrial size was normal in size. Pericardium: There is no evidence of pericardial effusion. Mitral Valve: The mitral valve is normal in structure. No evidence of mitral valve regurgitation. No evidence of mitral valve  stenosis. Tricuspid Valve: The tricuspid valve is normal in structure. Tricuspid valve regurgitation is trivial. No evidence of tricuspid stenosis. Aortic Valve: The aortic valve is tricuspid. Aortic valve regurgitation is not visualized. No aortic stenosis is present. Pulmonic Valve: The pulmonic valve was grossly normal. Pulmonic valve regurgitation is mild. No evidence of pulmonic stenosis. Aorta: The aortic root and ascending aorta are structurally normal, with no evidence of dilitation. IAS/Shunts: The atrial septum is grossly normal.  LEFT VENTRICLE PLAX 2D LVIDd:         4.30 cm  Diastology LVIDs:         2.40 cm  LV e' medial:    7.94 cm/s LV PW:         0.80 cm  LV E/e' medial:  10.2 LV IVS:        0.80 cm  LV e' lateral:   9.03 cm/s LVOT diam:     2.00 cm  LV E/e' lateral: 9.0 LV SV:         58 LV SV Index:   30 LVOT Area:     3.14 cm  RIGHT VENTRICLE            IVC RV Basal diam:  2.30 cm    IVC diam: 2.00 cm RV S prime:     8.59 cm/s TAPSE (M-mode): 1.6 cm LEFT ATRIUM             Index       RIGHT ATRIUM           Index LA diam:        3.60 cm 1.85 cm/m  RA Area:     14.10 cm LA Vol (A2C):   45.8 ml 23.49 ml/m RA Volume:   31.20 ml  16.00 ml/m LA Vol (A4C):   43.0 ml 22.05 ml/m LA Biplane Vol: 44.7 ml 22.92 ml/m  AORTIC VALVE LVOT Vmax:   102.25 cm/s LVOT Vmean:  67.300 cm/s LVOT VTI:    0.186 m  AORTA Ao Root diam: 3.10 cm Ao Asc diam:  2.80 cm MITRAL VALVE MV Area (PHT): 3.21 cm    SHUNTS MV Decel Time: 236 msec    Systemic VTI:  0.19 m MV E velocity: 81.00 cm/s  Systemic Diam: 2.00 cm MV A velocity: 74.40 cm/s MV E/A ratio:  1.09 Riley Lam MD Electronically signed by Riley Lam MD Signature Date/Time: 05/14/2021/11:40:35 AM    Final    CT HEAD CODE STROKE WO CONTRAST  Result Date: 05/13/2021 CLINICAL DATA:  Weakness EXAM: CT ANGIOGRAPHY HEAD AND NECK CT PERFUSION BRAIN TECHNIQUE: Multidetector CT imaging of the head and neck was performed using the standard protocol  during bolus administration of intravenous contrast. Multiplanar CT image reconstructions and MIPs were obtained to evaluate the vascular anatomy. Carotid stenosis measurements (when applicable) are obtained utilizing NASCET criteria, using the distal internal carotid diameter as the denominator. Multiphase CT imaging of the brain was performed following IV bolus contrast injection. Subsequent parametric perfusion maps were calculated using RAPID software. CONTRAST:  OMNIPAQUE IOHEXOL 350 MG/ML SOLN COMPARISON:  None. FINDINGS: CT HEAD Brain: There is no acute intracranial hemorrhage or mass  effect. Small area of hypoattenuation with loss of gray-white differentiation in the right parietal lobe possibly with some occipital extension. There is no extra-axial fluid collection. Ventricles and sulci are within normal limits in size and configuration. Vascular: No hyperdense vessel. Skull: Unremarkable. Sinuses/Orbits: No acute finding. Other: None. ASPECTS [facility] Stroke Program Early CT Score) - Ganglionic level infarction (caudate, lentiform nuclei, internal capsule, insula, M1-M3 cortex): 7 - Supraganglionic infarction (M4-M6 cortex): 2 Total score (0-10 with 10 being normal): 9 Review of the MIP images confirms the above findings CTA NECK Aortic arch: No significant plaque along the aortic arch. Great vessel origins are patent. Right carotid system: There is mixed but primarily noncalcified plaque at the bifurcation and proximal internal carotid causing 75% stenosis. Left carotid system: Patent. Trace calcified plaque at the ICA origin without stenosis. Vertebral arteries: Patent. Right vertebral artery is dominant. Minimal calcified plaque along the right V2 segment. No stenosis. Skeleton: Degenerative changes of the included spine. Other neck: Unremarkable. Upper chest: No apical lung mass. Review of the MIP images confirms the above findings CTA HEAD Anterior circulation: Intracranial internal carotid  arteries are patent with mild calcified plaque. Proximal anterior and middle cerebral arteries are patent. Posterior circulation: Intracranial vertebral arteries are patent. Left vertebral artery becomes diminutive after PICA origin. Basilar artery is patent. Major cerebellar artery origins are patent. Posterior cerebral arteries are patent. Venous sinuses: Patent as allowed by contrast bolus timing. Review of the MIP images confirms the above findings CT Brain Perfusion Findings: CBF (<30%) Volume: 8mL Perfusion (Tmax>6.0s) volume: 5mL Mismatch Volume: 3mL Infarction Location:Right parieto-occipital lobes corresponding to head CT finding IMPRESSION: Small acute infarction of the right parietooccipital lobes. There is no proximal intracranial vessel occlusion. 75% stenosis at the right ICA origin is noted and this may reflect a distal embolic or MCA/PCA watershed infarct. Perfusion imaging demonstrates 8 mL core infarction and 3 mL of penumbra in the right parieto-occipital lobes corresponding to noncontrast head CT abnormality. These results were called by telephone at the time of interpretation on 05/13/2021 at 3:44 pm to provider [facility] , who verbally acknowledged these results. Electronically Signed   By: Guadlupe Spanish M.D.   On: 05/13/2021 15:57   CT ANGIO HEAD NECK W WO CM W PERF (CODE STROKE)  Result Date: 05/13/2021 CLINICAL DATA:  Weakness EXAM: CT ANGIOGRAPHY HEAD AND NECK CT PERFUSION BRAIN TECHNIQUE: Multidetector CT imaging of the head and neck was performed using the standard protocol during bolus administration of intravenous contrast. Multiplanar CT image reconstructions and MIPs were obtained to evaluate the vascular anatomy. Carotid stenosis measurements (when applicable) are obtained utilizing NASCET criteria, using the distal internal carotid diameter as the denominator. Multiphase CT imaging of the brain was performed following IV bolus contrast injection. Subsequent parametric  perfusion maps were calculated using RAPID software. CONTRAST:  OMNIPAQUE IOHEXOL 350 MG/ML SOLN COMPARISON:  None. FINDINGS: CT HEAD Brain: There is no acute intracranial hemorrhage or mass effect. Small area of hypoattenuation with loss of gray-white differentiation in the right parietal lobe possibly with some occipital extension. There is no extra-axial fluid collection. Ventricles and sulci are within normal limits in size and configuration. Vascular: No hyperdense vessel. Skull: Unremarkable. Sinuses/Orbits: No acute finding. Other: None. ASPECTS [facility] Stroke Program Early CT Score) - Ganglionic level infarction (caudate, lentiform nuclei, internal capsule, insula, M1-M3 cortex): 7 - Supraganglionic infarction (M4-M6 cortex): 2 Total score (0-10 with 10 being normal): 9 Review of the MIP images confirms the above findings CTA  NECK Aortic arch: No significant plaque along the aortic arch. Great vessel origins are patent. Right carotid system: There is mixed but primarily noncalcified plaque at the bifurcation and proximal internal carotid causing 75% stenosis. Left carotid system: Patent. Trace calcified plaque at the ICA origin without stenosis. Vertebral arteries: Patent. Right vertebral artery is dominant. Minimal calcified plaque along the right V2 segment. No stenosis. Skeleton: Degenerative changes of the included spine. Other neck: Unremarkable. Upper chest: No apical lung mass. Review of the MIP images confirms the above findings CTA HEAD Anterior circulation: Intracranial internal carotid arteries are patent with mild calcified plaque. Proximal anterior and middle cerebral arteries are patent. Posterior circulation: Intracranial vertebral arteries are patent. Left vertebral artery becomes diminutive after PICA origin. Basilar artery is patent. Major cerebellar artery origins are patent. Posterior cerebral arteries are patent. Venous sinuses: Patent as allowed by contrast bolus timing. Review  of the MIP images confirms the above findings CT Brain Perfusion Findings: CBF (<30%) Volume: 8mL Perfusion (Tmax>6.0s) volume: 5mL Mismatch Volume: 3mL Infarction Location:Right parieto-occipital lobes corresponding to head CT finding IMPRESSION: Small acute infarction of the right parietooccipital lobes. There is no proximal intracranial vessel occlusion. 75% stenosis at the right ICA origin is noted and this may reflect a distal embolic or MCA/PCA watershed infarct. Perfusion imaging demonstrates 8 mL core infarction and 3 mL of penumbra in the right parieto-occipital lobes corresponding to noncontrast head CT abnormality. These results were called by telephone at the time of interpretation on 05/13/2021 at 3:44 pm to provider [facility] , who verbally acknowledged these results. Electronically Signed   By: Guadlupe Spanish M.D.   On: 05/13/2021 15:57               LOS: 1 day   Lira Stephen  [facility]   Pager on [email or web address]. If 7PM-7AM, please contact night-coverage at [email or web address]     05/14/2021, 1:52 PM

## 2021-05-14 NOTE — Progress Notes (Signed)
[facility] paged about patient behavior suggested to call her son when I did he did state that she does have anxiety but not taking any medications for this informed MD about this will continue to monitor. Ilean Skill LPN

## 2021-05-14 NOTE — Progress Notes (Signed)
Patient displayed erratic behavior at beginning os shift she complained of anxiety she was hot then cold. She would ask to go to bathroom then say she doesn't but will call later I told her let go while I am here she went and urinated she would throw her legs over the side rails saying she needs to stand I told she is at risk at getting hurt doing this behavior she would state that this is not her normal behavior but she would continue. She complained about the heart monitor I explained that it monitoring your heart and I don't have order to remove it. She stated she wanted to go home I informed her informed her that Md will make that decision. I did give the Ativan 0.5 mg but it didn't seem to help her relax. Will continue to monitor. Ilean Skill LPN

## 2021-05-15 DIAGNOSIS — I6521 Occlusion and stenosis of right carotid artery: Secondary | ICD-10-CM

## 2021-05-15 LAB — BASIC METABOLIC PANEL
Anion gap: 9 (ref 5–15)
BUN: 16 mg/dL (ref 8–23)
CO2: 23 mmol/L (ref 22–32)
Calcium: 8.7 mg/dL — ABNORMAL LOW (ref 8.9–10.3)
Chloride: 105 mmol/L (ref 98–111)
Creatinine, Ser: 0.85 mg/dL (ref 0.44–1.00)
GFR, Estimated: >60 mL/min (ref >60)
Glucose, Bld: 146 mg/dL — ABNORMAL HIGH (ref 70–99)
Potassium: 3.7 mmol/L (ref 3.5–5.1)
Sodium: 137 mmol/L (ref 135–145)

## 2021-05-15 LAB — CBC WITH DIFFERENTIAL/PLATELET
Abs Immature Granulocytes: 0.03 10*3/uL (ref 0.00–0.07)
Basophils Absolute: 0 10*3/uL (ref 0.0–0.1)
Basophils Relative: 0 %
Eosinophils Absolute: 0.4 10*3/uL (ref 0.0–0.5)
Eosinophils Relative: 5 %
HCT: 37.6 % (ref 36.0–46.0)
Hemoglobin: 12.2 g/dL (ref 12.0–15.0)
Immature Granulocytes: 0 %
Lymphocytes Relative: 25 %
Lymphs Abs: 2 10*3/uL (ref 0.7–4.0)
MCH: 28.8 pg (ref 26.0–34.0)
MCHC: 32.4 g/dL (ref 30.0–36.0)
MCV: 88.9 fL (ref 80.0–100.0)
Monocytes Absolute: 0.5 10*3/uL (ref 0.1–1.0)
Monocytes Relative: 7 %
Neutro Abs: 5 10*3/uL (ref 1.7–7.7)
Neutrophils Relative %: 63 %
Platelets: 321 10*3/uL (ref 150–400)
RBC: 4.23 MIL/uL (ref 3.87–5.11)
RDW: 14 % (ref 11.5–15.5)
WBC: 8 10*3/uL (ref 4.0–10.5)
nRBC: 0 % (ref 0.0–0.2)

## 2021-05-15 LAB — GLUCOSE, CAPILLARY
Glucose-Capillary: 132 mg/dL — ABNORMAL HIGH (ref 70–99)
Glucose-Capillary: 147 mg/dL — ABNORMAL HIGH (ref 70–99)
Glucose-Capillary: 140 mg/dL — ABNORMAL HIGH (ref 70–99)
Glucose-Capillary: 117 mg/dL — ABNORMAL HIGH (ref 70–99)

## 2021-05-15 MED ORDER — CEFAZOLIN SODIUM-DEXTROSE 2-4 GM/100ML-% IV SOLN
2.0000 g | INTRAVENOUS | Status: AC
  Administered 2021-05-16: 2 g via INTRAVENOUS
  Filled 2021-05-15: qty 100

## 2021-05-15 MED ORDER — ALPRAZOLAM 0.25 MG PO TABS
0.2500 mg | ORAL_TABLET | Freq: Two times a day (BID) | ORAL | Status: AC | PRN
  Administered 2021-05-15 – 2021-05-16 (×3): 0.25 mg via ORAL
  Filled 2021-05-15 (×4): qty 1

## 2021-05-15 MED ORDER — ASPIRIN EC 81 MG PO TBEC
81.0000 mg | DELAYED_RELEASE_TABLET | Freq: Every day | ORAL | Status: DC
  Administered 2021-05-15 – 2021-05-17 (×2): 81 mg via ORAL
  Filled 2021-05-15 (×3): qty 1

## 2021-05-15 NOTE — H&P (View-Only) (Signed)
  Progress Note    05/15/2021 7:55 AM Hospital Day 2  Subjective:  says she had a terrible night with anxiety and trouble with her sciatica.  Says she has not taken some medicine that helps with these issues.  Says no new neurologic events.  Says her vision may be better.    afebrile  Vitals:   05/15/21 0426 05/15/21 0741  BP: 117/71 138/80  Pulse: 81 82  Resp: 19 18  Temp: 98.4 F (36.9 C)   SpO2: 100% 98%    Physical Exam: Sitting on side of bed.  Bilateral hand grips are equal.   CBC    Component Value Date/Time   WBC 8.0 05/15/2021 0127   RBC 4.23 05/15/2021 0127   HGB 12.2 05/15/2021 0127   HCT 37.6 05/15/2021 0127   PLT 321 05/15/2021 0127   MCV 88.9 05/15/2021 0127   MCH 28.8 05/15/2021 0127   MCHC 32.4 05/15/2021 0127   RDW 14.0 05/15/2021 0127   LYMPHSABS 2.0 05/15/2021 0127   MONOABS 0.5 05/15/2021 0127   EOSABS 0.4 05/15/2021 0127   BASOSABS 0.0 05/15/2021 0127    BMET    Component Value Date/Time   NA 137 05/15/2021 0127   K 3.7 05/15/2021 0127   CL 105 05/15/2021 0127   CO2 23 05/15/2021 0127   GLUCOSE 146 (H) 05/15/2021 0127   BUN 16 05/15/2021 0127   CREATININE 0.85 05/15/2021 0127   CREATININE 0.59 01/22/2014 1605   CALCIUM 8.7 (L) 05/15/2021 0127   GFRNONAA >60 05/15/2021 0127   GFRNONAA >89 01/22/2014 1605   GFRAA >60 09/22/2017 0923   GFRAA >89 01/22/2014 1605    INR    Component Value Date/Time   INR 1.2 05/14/2021 0514     Intake/Output Summary (Last 24 hours) at 05/15/2021 0755 Last data filed at 05/15/2021 0602 Gross per 24 hour  Intake 845.99 ml  Output --  Net 845.99 ml     Assessment/Plan:  65 y.o. female with symptomatic right ICA stenosis Hospital Day 2  -pt seen by Dr. Myra Gianotti yesterday and plan is for right TCAR tomorrow.  No new neurologic events overnight -discussed with pt to discuss with primary team hospitalist about her medications for sciatica and anxiety. -pt now on plavix/asa/statin and these need to  continue -npo after MN/consent/labs (orders placed)   Doreatha Massed, PA-C [facility] [phone] 05/15/2021 7:55 AM   I agree with the above.  I have seen and evaluated the patient.  We discussed proceeding with right sided TCAR for stroke prevention tomorrow.  I spoke with Dr. Roda Shutters, and he stated it was safe to start 81 mg of aspirin and Plavix in anticipation of the carotid stent.  I again discussed the details of the procedure as well as the risk of stroke.  All questions were answered and she wants to proceed.  Durene Cal

## 2021-05-15 NOTE — Progress Notes (Addendum)
  Progress Note    05/15/2021 7:55 AM Hospital Day 2  Subjective:  says she had a terrible night with anxiety and trouble with her sciatica.  Says she has not taken some medicine that helps with these issues.  Says no new neurologic events.  Says her vision may be better.    afebrile  Vitals:   05/15/21 0426 05/15/21 0741  BP: 117/71 138/80  Pulse: 81 82  Resp: 19 18  Temp: 98.4 F (36.9 C)   SpO2: 100% 98%    Physical Exam: Sitting on side of bed.  Bilateral hand grips are equal.   CBC    Component Value Date/Time   WBC 8.0 05/15/2021 0127   RBC 4.23 05/15/2021 0127   HGB 12.2 05/15/2021 0127   HCT 37.6 05/15/2021 0127   PLT 321 05/15/2021 0127   MCV 88.9 05/15/2021 0127   MCH 28.8 05/15/2021 0127   MCHC 32.4 05/15/2021 0127   RDW 14.0 05/15/2021 0127   LYMPHSABS 2.0 05/15/2021 0127   MONOABS 0.5 05/15/2021 0127   EOSABS 0.4 05/15/2021 0127   BASOSABS 0.0 05/15/2021 0127    BMET    Component Value Date/Time   NA 137 05/15/2021 0127   K 3.7 05/15/2021 0127   CL 105 05/15/2021 0127   CO2 23 05/15/2021 0127   GLUCOSE 146 (H) 05/15/2021 0127   BUN 16 05/15/2021 0127   CREATININE 0.85 05/15/2021 0127   CREATININE 0.59 01/22/2014 1605   CALCIUM 8.7 (L) 05/15/2021 0127   GFRNONAA >60 05/15/2021 0127   GFRNONAA >89 01/22/2014 1605   GFRAA >60 09/22/2017 0923   GFRAA >89 01/22/2014 1605    INR    Component Value Date/Time   INR 1.2 05/14/2021 0514     Intake/Output Summary (Last 24 hours) at 05/15/2021 0755 Last data filed at 05/15/2021 0602 Gross per 24 hour  Intake 845.99 ml  Output --  Net 845.99 ml     Assessment/Plan:  64 y.o. female with symptomatic right ICA stenosis Hospital Day 2  -pt seen by Dr. Huie Ghuman yesterday and plan is for right TCAR tomorrow.  No new neurologic events overnight -discussed with pt to discuss with primary team hospitalist about her medications for sciatica and anxiety. -pt now on plavix/asa/statin and these need to  continue -npo after MN/consent/labs (orders placed)   Tiffany Rhyne, PA-C [facility] [phone] 05/15/2021 7:55 AM   I agree with the above.  I have seen and evaluated the patient.  We discussed proceeding with right sided TCAR for stroke prevention tomorrow.  I spoke with Dr. Xu, and he stated it was safe to start 81 mg of aspirin and Plavix in anticipation of the carotid stent.  I again discussed the details of the procedure as well as the risk of stroke.  All questions were answered and she wants to proceed.  Tiffany Navarro 

## 2021-05-15 NOTE — Progress Notes (Addendum)
Progress Note    Tiffany FleetingLaurie E Rickels  AYT:[id number] DOB: 08/09/1956  DOA: 05/13/2021 PCP: Marletta LorBarr, Julie, NP      Brief Narrative:    Medical records reviewed and are as summarized below:  Tiffany Navarro is a 65 y.o. female with medical history significant of type 1 diabetes, hypertension, hyperlipidemia, osteoarthritis, hypothyroidism, who presented to the hospital with left-sided weakness, generalized weakness, aphasia and unsteady gait.  She was found to have acute embolic stroke involving the right frontal, parietal and occipital areas.  She also has mild petechiae in the region and right parieto-occipital region and 75% right ICA stenosis.  She was out of the window for tPA.    Assessment/Plan:   Principal Problem:   Acute embolic stroke [facility](HCC) Active Problems:   Carotid stenosis, right    Body mass index is 32.17 kg/m.  (Obesity)   Acute embolic stroke involving the right frontal, parietal and occipital lobes, minor petechial hemorrhage in the right parieto-occipital region, 75% right ICA stenosis: Continue aspirin, Plavix and Lipitor.  Plan for transcarotid artery revascularization tomorrow.  Follow-up with neurologist and vascular surgeon.  2D echo i showed EF estimated at 60 to 65%, normal LV diastolic parameters.  Type I DM, diabetic neuropathy: NovoLog as needed for hyperglycemia.  Glucose levels are okay for now.  Hemoglobin A1c is 7.1.  Hyperlipidemia:  LDL 35, HDL 44, triglycerides 119, total cholesterol 103.   Continue Lipitor and fenofibrate  Hypokalemia: Improved  Leukocytosis: Resolved     Diet Order             Diet NPO time specified Except for: Sips with Meds  Diet effective midnight           Diet Carb Modified Fluid consistency: Thin; Room service appropriate? Yes  Diet effective now                      Consultants: Neurologist Vascular surgeon  Procedures: None    Medications:    aspirin EC  81 mg Oral Daily    atorvastatin  40 mg Oral Daily   clopidogrel  75 mg Oral Daily   enoxaparin (LOVENOX) injection  40 mg Subcutaneous Q24H   fenofibrate  160 mg Oral Daily   insulin aspart  0-9 Units Subcutaneous TID WC   levothyroxine  112 mcg Oral Daily   melatonin  5 mg Oral QHS   pantoprazole  40 mg Oral Daily   pregabalin  50 mg Oral TID   Continuous Infusions:  sodium chloride 75 mL/hr at 05/15/21 0602   [START ON 05/16/2021]  ceFAZolin (ANCEF) IV        Anti-infectives (From admission, onward)    Start     Dose/Rate Route Frequency Ordered Stop   05/16/21 0600  ceFAZolin (ANCEF) IVPB 2g/100 mL premix       Note to Pharmacy: Send with pt to OR   2 g 200 mL/hr over 30 Minutes Intravenous To Short Stay 05/15/21 0759 05/17/21 0600              Family Communication/Anticipated D/C date and plan/Code Status   DVT prophylaxis: enoxaparin (LOVENOX) injection 40 mg Start: 05/13/21 2200 SCDs Start: 05/13/21 1734     Code Status: Full Code  Family Communication: Gregary SignsSean, son Disposition Plan:    Status is: Inpatient  Remains inpatient appropriate because:IV treatments appropriate due to intensity of illness or inability to take PO  Dispo: The patient is from: Home  Anticipated d/c is to: Home              Patient currently is not medically stable to d/c.   Difficult to place patient No           Subjective:   C/o anxiety.  She said she had a rough night.  Her son was at the bedside this morning.  Objective:    Vitals:   05/14/21 2044 05/14/21 2314 05/15/21 0426 05/15/21 0741  BP: 135/66 (!) 149/82 117/71 138/80  Pulse: 90 95 81 82  Resp: 18 18 19 18   Temp: 98.1 F (36.7 C) 98.5 F (36.9 C) 98.4 F (36.9 C)   TempSrc: Oral Oral Oral   SpO2: 100% 99% 100% 98%  Weight:      Height:       No data found.   Intake/Output Summary (Last 24 hours) at 05/15/2021 0831 Last data filed at 05/15/2021 0602 Gross per 24 hour  Intake 845.99 ml  Output --   Net 845.99 ml   Filed Weights   05/13/21 2320  Weight: 87.7 kg    Exam:  GEN: NAD SKIN: No rash EYES: EOMI ENT: MMM CV: RRR PULM: CTA B ABD: soft, ND, NT, +BS CNS: AAO x 3, non focal EXT: No edema or tenderness         Data Reviewed:   I have personally reviewed following labs and imaging studies:  Labs: Labs show the following:   Basic Metabolic Panel: Recent Labs  Lab 05/13/21 1458 05/14/21 0514 05/14/21 1352 05/15/21 0127  NA 137 136  --  137  K 4.3 3.4*  --  3.7  CL 102 100  --  105  CO2 24 25  --  23  GLUCOSE 162* 99  --  146*  BUN 24* 17  --  16  CREATININE 1.16* 1.06*  --  0.85  CALCIUM 9.4 9.1  --  8.7*  MG  --   --  2.1  --    GFR Estimated Creatinine Clearance: 73.2 mL/min (by C-G formula based on SCr of 0.85 mg/dL). Liver Function Tests: Recent Labs  Lab 05/13/21 1458 05/14/21 0514  AST 23 18  ALT 22 21  ALKPHOS 40 33*  BILITOT 0.4 0.6  PROT 7.9 6.5  ALBUMIN 4.0 3.4*   No results for input(s): LIPASE, AMYLASE in the last 168 hours. No results for input(s): AMMONIA in the last 168 hours. Coagulation profile Recent Labs  Lab 05/13/21 1458 05/14/21 0514  INR 1.1 1.2    CBC: Recent Labs  Lab 05/13/21 1458 05/13/21 1734 05/14/21 0514 05/15/21 0127  WBC 21.9* 19.4* 13.2* 8.0  NEUTROABS 20.2*  --   --  5.0  HGB 13.7 12.5 12.4 12.2  HCT 41.2 38.9 38.5 37.6  MCV 89.0 89.2 88.9 88.9  PLT 378 336 308 321   Cardiac Enzymes: No results for input(s): CKTOTAL, CKMB, CKMBINDEX, TROPONINI in the last 168 hours. BNP (last 3 results) No results for input(s): PROBNP in the last 8760 hours. CBG: Recent Labs  Lab 05/14/21 0817 05/14/21 1112 05/14/21 1613 05/14/21 2231 05/15/21 0609  GLUCAP 113* 105* 134* 148* 132*   D-Dimer: No results for input(s): DDIMER in the last 72 hours. Hgb A1c: Recent Labs    05/14/21 0514  HGBA1C 7.1*   Lipid Profile: Recent Labs    05/14/21 0514  CHOL 103  HDL 44  LDLCALC 35  TRIG  05/16/21  CHOLHDL 2.3   Thyroid function studies: Recent Labs  05/13/21 1800  TSH 2.876   Anemia work up: No results for input(s): VITAMINB12, FOLATE, FERRITIN, TIBC, IRON, RETICCTPCT in the last 72 hours. Sepsis Labs: Recent Labs  Lab 05/13/21 1458 05/13/21 1734 05/14/21 0514 05/15/21 0127  WBC 21.9* 19.4* 13.2* 8.0    Microbiology Recent Results (from the past 240 hour(s))  Resp Panel by RT-PCR (Flu A&B, Covid) Nasopharyngeal Swab     Status: None   Collection Time: 05/13/21  3:51 PM   Specimen: Nasopharyngeal Swab; Nasopharyngeal(NP) swabs in vial transport medium  Result Value Ref Range Status   SARS Coronavirus 2 by RT PCR NEGATIVE NEGATIVE Final    Comment: (NOTE) SARS-CoV-2 target nucleic acids are NOT DETECTED.  The SARS-CoV-2 RNA is generally detectable in upper respiratory specimens during the acute phase of infection. The lowest concentration of SARS-CoV-2 viral copies this assay can detect is 138 copies/mL. A negative result does not preclude SARS-Cov-2 infection and should not be used as the sole basis for treatment or other patient management decisions. A negative result may occur with  improper specimen collection/handling, submission of specimen other than nasopharyngeal swab, presence of viral mutation(s) within the areas targeted by this assay, and inadequate number of viral copies(<138 copies/mL). A negative result must be combined with clinical observations, patient history, and epidemiological information. The expected result is Negative.  Fact Sheet for Patients:  BloggerCourse.com  Fact Sheet for Healthcare Providers:  SeriousBroker.it  This test is no t yet approved or cleared by the [place] FDA and  has been authorized for detection and/or diagnosis of SARS-CoV-2 by FDA under an Emergency Use Authorization (EUA). This EUA will remain  in effect (meaning this test can be used) for the  duration of the COVID-19 declaration under Section 564(b)(1) of the Act, 21 U.S.C.section 360bbb-3(b)(1), unless the authorization is terminated  or revoked sooner.       Influenza A by PCR NEGATIVE NEGATIVE Final   Influenza B by PCR NEGATIVE NEGATIVE Final    Comment: (NOTE) The Xpert Xpress SARS-CoV-2/FLU/RSV plus assay is intended as an aid in the diagnosis of influenza from Nasopharyngeal swab specimens and should not be used as a sole basis for treatment. Nasal washings and aspirates are unacceptable for Xpert Xpress SARS-CoV-2/FLU/RSV testing.  Fact Sheet for Patients: BloggerCourse.com  Fact Sheet for Healthcare Providers: SeriousBroker.it  This test is not yet approved or cleared by the [place] FDA and has been authorized for detection and/or diagnosis of SARS-CoV-2 by FDA under an Emergency Use Authorization (EUA). This EUA will remain in effect (meaning this test can be used) for the duration of the COVID-19 declaration under Section 564(b)(1) of the Act, 21 U.S.C. section 360bbb-3(b)(1), unless the authorization is terminated or revoked.  Performed at [facility], [address], [place], [place] [address]   Culture, blood (routine x 2)     Status: None (Preliminary result)   Collection Time: 05/13/21  4:11 PM   Specimen: BLOOD  Result Value Ref Range Status   Specimen Description   Final    BLOOD RIGHT ANTECUBITAL Performed at [facility], [address], [place], [place] [address]    Special Requests   Final    BOTTLES DRAWN AEROBIC AND ANAEROBIC Blood Culture adequate volume Performed at [facility], [address] [place], [place] [address]    Culture   Final    NO GROWTH 2 DAYS Performed at [facility], [address], [place], [place] [address]  Report Status PENDING  Incomplete  Culture, blood (routine x 2)     Status: None  (Preliminary result)   Collection Time: 05/13/21  4:16 PM   Specimen: BLOOD  Result Value Ref Range Status   Specimen Description   Final    BLOOD LEFT ANTECUBITAL Performed at [facility], [address], [place], [place] [address]    Special Requests   Final    BOTTLES DRAWN AEROBIC ONLY Blood Culture adequate volume Performed at [facility], [address], [place], [place] [address]    Culture   Final    NO GROWTH 2 DAYS Performed at [facility], [address], [place], [place] [address]    Report Status PENDING  Incomplete    Procedures and diagnostic studies:  MR BRAIN WO CONTRAST  Result Date: 05/13/2021 CLINICAL DATA:  Stroke, follow-up EXAM: MRI HEAD WITHOUT CONTRAST TECHNIQUE: Multiplanar, multiecho pulse sequences of the brain and surrounding structures were obtained without intravenous contrast. COMPARISON:  Recent CT imaging FINDINGS: Brain: There are cortical/subcortical areas of restricted diffusion in the right frontal, parietal, and occipital lobes. There is some involvement of the perirolandic region. Minor susceptibility in the right parieto-occipital region likely reflecting petechial hemorrhage. Patchy small foci of T2 hyperintensity in the supratentorial white matter nonspecific but may reflect minor chronic microvascular ischemic changes. Ventricles and sulci are normal in size and configuration. There is no intracranial mass or mass effect. There is no hydrocephalus or extra-axial fluid collection. Vascular: Major vessel flow voids at the skull base are preserved. Skull and upper cervical spine: Normal marrow signal is preserved. Sinuses/Orbits: Trace mucosal thickening.  Orbits are unremarkable. Other: Sella is unremarkable.  Mastoid air cells are clear. IMPRESSION: Acute cortical/subcortical infarcts of the right frontal, parietal, and occipital lobes. Minor petechial hemorrhage in the right parieto-occipital region.  Minor chronic microvascular ischemic changes. Electronically Signed   By: Guadlupe Spanish M.D.   On: 05/13/2021 18:13   ECHOCARDIOGRAM COMPLETE  Result Date: 05/14/2021    ECHOCARDIOGRAM REPORT   Patient Name:   Tiffany Navarro Date of Exam: 05/14/2021 Medical Rec #:  [id number]         Height:       65.0 in Accession #:    [id number]        Weight:       193.3 lb Date of Birth:  06/29/1956          BSA:          1.950 m Patient Age:    64 years          BP:           115/70 mmHg Patient Gender: F                 HR:           78 bpm. Exam Location:  Inpatient Procedure: 2D Echo, Cardiac Doppler and Color Doppler Indications:    R94.31 Abnormal EKG  History:        Patient has no prior history of Echocardiogram examinations.                 Risk Factors:Hypertension. Hypothyroidism.  Sonographer:    Elmarie Shiley Dance Referring Phys: [id number] ELIZABETH G MATHEWS IMPRESSIONS  1. Left ventricular ejection fraction, by estimation, is 60 to 65%. The left ventricle has normal function. The left ventricle has no regional wall motion abnormalities. Left ventricular diastolic parameters were normal.  2. Right  ventricular systolic function is normal. The right ventricular size is normal. Tricuspid regurgitation signal is inadequate for assessing PA pressure.  3. The mitral valve is normal in structure. No evidence of mitral valve regurgitation. No evidence of mitral stenosis.  4. The aortic valve is tricuspid. Aortic valve regurgitation is not visualized. No aortic stenosis is present. Comparison(s): No prior Echocardiogram. Conclusion(s)/Recommendation(s): Otherwise normal echocardiogram, with minor abnormalities described in the report. FINDINGS  Left Ventricle: Left ventricular ejection fraction, by estimation, is 60 to 65%. The left ventricle has normal function. The left ventricle has no regional wall motion abnormalities. The left ventricular internal cavity size was normal in size. There is  no left ventricular  hypertrophy. Left ventricular diastolic parameters were normal. Right Ventricle: The right ventricular size is normal. No increase in right ventricular wall thickness. Right ventricular systolic function is normal. Tricuspid regurgitation signal is inadequate for assessing PA pressure. Left Atrium: Left atrial size was normal in size. Right Atrium: Right atrial size was normal in size. Pericardium: There is no evidence of pericardial effusion. Mitral Valve: The mitral valve is normal in structure. No evidence of mitral valve regurgitation. No evidence of mitral valve stenosis. Tricuspid Valve: The tricuspid valve is normal in structure. Tricuspid valve regurgitation is trivial. No evidence of tricuspid stenosis. Aortic Valve: The aortic valve is tricuspid. Aortic valve regurgitation is not visualized. No aortic stenosis is present. Pulmonic Valve: The pulmonic valve was grossly normal. Pulmonic valve regurgitation is mild. No evidence of pulmonic stenosis. Aorta: The aortic root and ascending aorta are structurally normal, with no evidence of dilitation. IAS/Shunts: The atrial septum is grossly normal.  LEFT VENTRICLE PLAX 2D LVIDd:         4.30 cm  Diastology LVIDs:         2.40 cm  LV e' medial:    7.94 cm/s LV PW:         0.80 cm  LV E/e' medial:  10.2 LV IVS:        0.80 cm  LV e' lateral:   9.03 cm/s LVOT diam:     2.00 cm  LV E/e' lateral: 9.0 LV SV:         58 LV SV Index:   30 LVOT Area:     3.14 cm  RIGHT VENTRICLE            IVC RV Basal diam:  2.30 cm    IVC diam: 2.00 cm RV S prime:     8.59 cm/s TAPSE (M-mode): 1.6 cm LEFT ATRIUM             Index       RIGHT ATRIUM           Index LA diam:        3.60 cm 1.85 cm/m  RA Area:     14.10 cm LA Vol (A2C):   45.8 ml 23.49 ml/m RA Volume:   31.20 ml  16.00 ml/m LA Vol (A4C):   43.0 ml 22.05 ml/m LA Biplane Vol: 44.7 ml 22.92 ml/m  AORTIC VALVE LVOT Vmax:   102.25 cm/s LVOT Vmean:  67.300 cm/s LVOT VTI:    0.186 m  AORTA Ao Root diam: 3.10 cm Ao Asc  diam:  2.80 cm MITRAL VALVE MV Area (PHT): 3.21 cm    SHUNTS MV Decel Time: 236 msec    Systemic VTI:  0.19 m MV E velocity: 81.00 cm/s  Systemic Diam: 2.00 cm MV A velocity: 74.40 cm/s MV E/A  ratio:  1.09 Riley Lam MD Electronically signed by Riley Lam MD Signature Date/Time: 05/14/2021/11:40:35 AM    Final    CT HEAD CODE STROKE WO CONTRAST  Result Date: 05/13/2021 CLINICAL DATA:  Weakness EXAM: CT ANGIOGRAPHY HEAD AND NECK CT PERFUSION BRAIN TECHNIQUE: Multidetector CT imaging of the head and neck was performed using the standard protocol during bolus administration of intravenous contrast. Multiplanar CT image reconstructions and MIPs were obtained to evaluate the vascular anatomy. Carotid stenosis measurements (when applicable) are obtained utilizing NASCET criteria, using the distal internal carotid diameter as the denominator. Multiphase CT imaging of the brain was performed following IV bolus contrast injection. Subsequent parametric perfusion maps were calculated using RAPID software. CONTRAST:  OMNIPAQUE IOHEXOL 350 MG/ML SOLN COMPARISON:  None. FINDINGS: CT HEAD Brain: There is no acute intracranial hemorrhage or mass effect. Small area of hypoattenuation with loss of gray-white differentiation in the right parietal lobe possibly with some occipital extension. There is no extra-axial fluid collection. Ventricles and sulci are within normal limits in size and configuration. Vascular: No hyperdense vessel. Skull: Unremarkable. Sinuses/Orbits: No acute finding. Other: None. ASPECTS [facility] Stroke Program Early CT Score) - Ganglionic level infarction (caudate, lentiform nuclei, internal capsule, insula, M1-M3 cortex): 7 - Supraganglionic infarction (M4-M6 cortex): 2 Total score (0-10 with 10 being normal): 9 Review of the MIP images confirms the above findings CTA NECK Aortic arch: No significant plaque along the aortic arch. Great vessel origins are patent. Right carotid  system: There is mixed but primarily noncalcified plaque at the bifurcation and proximal internal carotid causing 75% stenosis. Left carotid system: Patent. Trace calcified plaque at the ICA origin without stenosis. Vertebral arteries: Patent. Right vertebral artery is dominant. Minimal calcified plaque along the right V2 segment. No stenosis. Skeleton: Degenerative changes of the included spine. Other neck: Unremarkable. Upper chest: No apical lung mass. Review of the MIP images confirms the above findings CTA HEAD Anterior circulation: Intracranial internal carotid arteries are patent with mild calcified plaque. Proximal anterior and middle cerebral arteries are patent. Posterior circulation: Intracranial vertebral arteries are patent. Left vertebral artery becomes diminutive after PICA origin. Basilar artery is patent. Major cerebellar artery origins are patent. Posterior cerebral arteries are patent. Venous sinuses: Patent as allowed by contrast bolus timing. Review of the MIP images confirms the above findings CT Brain Perfusion Findings: CBF (<30%) Volume: 43mL Perfusion (Tmax>6.0s) volume: 7mL Mismatch Volume: 95mL Infarction Location:Right parieto-occipital lobes corresponding to head CT finding IMPRESSION: Small acute infarction of the right parietooccipital lobes. There is no proximal intracranial vessel occlusion. 75% stenosis at the right ICA origin is noted and this may reflect a distal embolic or MCA/PCA watershed infarct. Perfusion imaging demonstrates 8 mL core infarction and 3 mL of penumbra in the right parieto-occipital lobes corresponding to noncontrast head CT abnormality. These results were called by telephone at the time of interpretation on 05/13/2021 at 3:44 pm to provider [facility] , who verbally acknowledged these results. Electronically Signed   By: Guadlupe Spanish M.D.   On: 05/13/2021 15:57   CT ANGIO HEAD NECK W WO CM W PERF (CODE STROKE)  Result Date: 05/13/2021 CLINICAL DATA:   Weakness EXAM: CT ANGIOGRAPHY HEAD AND NECK CT PERFUSION BRAIN TECHNIQUE: Multidetector CT imaging of the head and neck was performed using the standard protocol during bolus administration of intravenous contrast. Multiplanar CT image reconstructions and MIPs were obtained to evaluate the vascular anatomy. Carotid stenosis measurements (when applicable) are obtained utilizing NASCET criteria, using the distal  internal carotid diameter as the denominator. Multiphase CT imaging of the brain was performed following IV bolus contrast injection. Subsequent parametric perfusion maps were calculated using RAPID software. CONTRAST:  OMNIPAQUE IOHEXOL 350 MG/ML SOLN COMPARISON:  None. FINDINGS: CT HEAD Brain: There is no acute intracranial hemorrhage or mass effect. Small area of hypoattenuation with loss of gray-white differentiation in the right parietal lobe possibly with some occipital extension. There is no extra-axial fluid collection. Ventricles and sulci are within normal limits in size and configuration. Vascular: No hyperdense vessel. Skull: Unremarkable. Sinuses/Orbits: No acute finding. Other: None. ASPECTS [facility] Stroke Program Early CT Score) - Ganglionic level infarction (caudate, lentiform nuclei, internal capsule, insula, M1-M3 cortex): 7 - Supraganglionic infarction (M4-M6 cortex): 2 Total score (0-10 with 10 being normal): 9 Review of the MIP images confirms the above findings CTA NECK Aortic arch: No significant plaque along the aortic arch. Great vessel origins are patent. Right carotid system: There is mixed but primarily noncalcified plaque at the bifurcation and proximal internal carotid causing 75% stenosis. Left carotid system: Patent. Trace calcified plaque at the ICA origin without stenosis. Vertebral arteries: Patent. Right vertebral artery is dominant. Minimal calcified plaque along the right V2 segment. No stenosis. Skeleton: Degenerative changes of the included spine. Other neck:  Unremarkable. Upper chest: No apical lung mass. Review of the MIP images confirms the above findings CTA HEAD Anterior circulation: Intracranial internal carotid arteries are patent with mild calcified plaque. Proximal anterior and middle cerebral arteries are patent. Posterior circulation: Intracranial vertebral arteries are patent. Left vertebral artery becomes diminutive after PICA origin. Basilar artery is patent. Major cerebellar artery origins are patent. Posterior cerebral arteries are patent. Venous sinuses: Patent as allowed by contrast bolus timing. Review of the MIP images confirms the above findings CT Brain Perfusion Findings: CBF (<30%) Volume: 82mL Perfusion (Tmax>6.0s) volume: 52mL Mismatch Volume: 47mL Infarction Location:Right parieto-occipital lobes corresponding to head CT finding IMPRESSION: Small acute infarction of the right parietooccipital lobes. There is no proximal intracranial vessel occlusion. 75% stenosis at the right ICA origin is noted and this may reflect a distal embolic or MCA/PCA watershed infarct. Perfusion imaging demonstrates 8 mL core infarction and 3 mL of penumbra in the right parieto-occipital lobes corresponding to noncontrast head CT abnormality. These results were called by telephone at the time of interpretation on 05/13/2021 at 3:44 pm to provider [facility] , who verbally acknowledged these results. Electronically Signed   By: Guadlupe Spanish M.D.   On: 05/13/2021 15:57               LOS: 2 days   Jacoya Bauman  [facility]   Pager on [email or web address]. If 7PM-7AM, please contact night-coverage at [email or web address]     05/15/2021, 8:31 AM

## 2021-05-15 NOTE — Progress Notes (Signed)
Physical Therapy Treatment Patient Details Name: Tiffany Navarro MRN: [id number] DOB: 01-19-1956 Today's Date: 05/15/2021    History of Present Illness 65 y.o. female with medical history significant of type 1 diabetes, hypertension, hyperlipidemia, osteoarthritis, hypothyroidism admitted with complaints of generalized weakness.  Now complains of L sided weakness and L visual field cut.    PT Comments    Pt tolerates treatment well but continues to require verbal cues for attention to L side and to scan environment to left side more frequently due to vision deficits. With cueing pt is able to recall these precautions and demonstrates improved safety. Pt experiences no losses of balance when ambulating other than when bumping into 2 objects within room prior to cues. Pt will benefit from continued acute PT services to continue to reinforce L sided attention and awareness. PT continues to recommend outpatient PT and a RW at the time of discharge.   Follow Up Recommendations  Outpatient PT;Supervision for mobility/OOB     Equipment Recommendations  Rolling walker with 5" wheels    Recommendations for Other Services       Precautions / Restrictions Precautions Precautions: Fall Precaution Comments: L hemianopia and inattention Restrictions Weight Bearing Restrictions: No    Mobility  Bed Mobility                    Transfers Overall transfer level: Needs assistance Equipment used: Rolling walker (2 wheeled) Transfers: Sit to/from Stand Sit to Stand: Supervision            Ambulation/Gait Ambulation/Gait assistance: Min guard Gait Distance (Feet): 450 Feet Assistive device: Rolling walker (2 wheeled) Gait Pattern/deviations: Step-through pattern Gait velocity: functional Gait velocity interpretation: 1.31 - 2.62 ft/sec, indicative of limited community ambulator General Gait Details: pt with slowed step-through gait, initially bumping into 2 objects on left side  prior to leaving room. Pt demonstrates improved scanning to left side after initial PT cues. PT also providing cues to maintain L hand on walker at all times when ambulating, if needing to remove left hand from walker to stop gait and replace hand before initiating further mobility   Stairs             Wheelchair Mobility    Modified Rankin (Stroke Patients Only) Modified Rankin (Stroke Patients Only) Pre-Morbid Rankin Score: No symptoms Modified Rankin: Moderately severe disability     Balance Overall balance assessment: Needs assistance Sitting-balance support: No upper extremity supported;Feet supported Sitting balance-Leahy Scale: Good     Standing balance support: Single extremity supported Standing balance-Leahy Scale: Poor Standing balance comment: reliant on UE support of RW                            Cognition Arousal/Alertness: Awake/alert Behavior During Therapy: Impulsive Overall Cognitive Status: Impaired/Different from baseline Area of Impairment: Awareness;Memory;Safety/judgement                     Memory: Decreased short-term memory   Safety/Judgement: Decreased awareness of safety Awareness: Emergent          Exercises      General Comments General comments (skin integrity, edema, etc.): VSS on RA      Pertinent Vitals/Pain Pain Assessment: Faces Faces Pain Scale: Hurts little more Pain Location: head Pain Descriptors / Indicators: Aching Pain Intervention(s): Monitored during session    Home Living  Prior Function            PT Goals (current goals can now be found in the care plan section) Acute Rehab PT Goals Patient Stated Goal: regain independence in mobility Progress towards PT goals: Progressing toward goals    Frequency    Min 4X/week      PT Plan Current plan remains appropriate    Co-evaluation              AM-PAC PT "6 Clicks" Mobility   Outcome  Measure  Help needed turning from your back to your side while in a flat bed without using bedrails?: None Help needed moving from lying on your back to sitting on the side of a flat bed without using bedrails?: None Help needed moving to and from a bed to a chair (including a wheelchair)?: A Little Help needed standing up from a chair using your arms (e.g., wheelchair or bedside chair)?: A Little Help needed to walk in hospital room?: A Little Help needed climbing 3-5 steps with a railing? : A Little 6 Click Score: 20    End of Session Equipment Utilized During Treatment: Gait belt Activity Tolerance: Patient tolerated treatment well Patient left: in bed;with call bell/phone within reach;with bed alarm set Nurse Communication: Mobility status PT Visit Diagnosis: Other abnormalities of gait and mobility (R26.89);Other symptoms and signs involving the nervous system (R29.898)     Time: 2620-3559 PT Time Calculation (min) (ACUTE ONLY): 23 min  Charges:  $Gait Training: 8-22 mins $Therapeutic Activity: 8-22 mins                     Arlyss Gandy, PT, DPT Acute Rehabilitation Pager: [phone]    Arlyss Gandy 05/15/2021, 11:57 AM

## 2021-05-15 NOTE — Progress Notes (Addendum)
STROKE TEAM PROGRESS NOTE   Interval History  Overnight she did not sleep well due to sciatica pain. After receiving her Lyrica she was able to sleep.   Neurological exam is improved- left inferior quadrantanopsia is not noted on exam this morning. She states she has been able to see better and feels that a small part of her vision is still lacking- not appreciated with VF exam this morning.    Son Ines Bloomer is at bedside, all questions answered.    Pertinent Lab Work and Imaging    CT Head WO IV Contrast Small acute infarction of the right parietooccipital lobes. There is no proximal intracranial vessel occlusion. 75% stenosis at the right ICA origin is noted and this may reflect a distal embolic or MCA/PCA watershed infarct.   CT Angio Head and Neck W WO IV Contrast CTA NECK   Aortic arch: No significant plaque along the aortic arch. Great vessel origins are patent.   Right carotid system: There is mixed but primarily noncalcified plaque at the bifurcation and proximal internal carotid causing 75% stenosis.   Left carotid system: Patent. Trace calcified plaque at the ICA origin without stenosis.   Vertebral arteries: Patent. Right vertebral artery is dominant. Minimal calcified plaque along the right V2 segment. No stenosis.   Skeleton: Degenerative changes of the included spine.   Other neck: Unremarkable.   Upper chest: No apical lung mass.   CTA HEAD   Anterior circulation: Intracranial internal carotid arteries are patent with mild calcified plaque. Proximal anterior and middle cerebral arteries are patent.   Posterior circulation: Intracranial vertebral arteries are patent. Left vertebral artery becomes diminutive after PICA origin. Basilar artery is patent. Major cerebellar artery origins are patent. Posterior cerebral arteries are patent.   Venous sinuses: Patent as allowed by contrast bolus timing.  MRI Brain WO IV Contrast Acute cortical/subcortical infarcts of the  right frontal, parietal, and occipital lobes. Minor petechial hemorrhage in the right parieto-occipital region. Minor chronic microvascular ischemic changes.  Echocardiogram Complete   1. Left ventricular ejection fraction, by estimation, is 60 to 65%. The left ventricle has normal function. The left ventricle has no regional wall motion abnormalities. Left ventricular diastolic parameters were normal.   2. Right ventricular systolic function is normal. The right ventricular size is normal. Tricuspid regurgitation signal is inadequate for assessing PA pressure.   3. The mitral valve is normal in structure. No evidence of mitral valve regurgitation. No evidence of mitral stenosis.   4. The aortic valve is tricuspid. Aortic valve regurgitation is not visualized. No aortic stenosis is present.   Physical Examination   Constitutional: Calm, appropriate for condition  Cardiovascular: Normal RR Respiratory: No increased WOB   Mental status: AAOx4, following commands  Speech: Fluent with repetition and naming intact. No dysarthria  Cranial nerves: EOMI, VFF, Face symmetric, Tongue midline, Shoulder shrug intact   Motor: Normal bulk and tone. Pronator drift noted to the left arm.  Dlt Bic Tri FgS Grp HF  KnF KnE PIF DoF  R 5 5 5 5 5 5 5 5 5 5   L 5 5 5 5 5 5 5 5 5 5   Sensory: Intact to light tough throughout  Coordination: Intact FNF  Gait: Deferred   NIHSS: 0   Assessment and Plan   Ms. TRU LEOPARD is a 65 y.o. female w/pmh of type 1 diabetes, hypertension, hyperlipidemia, hypothyroidism, osteoarthritis who presents with generalized weakness found to have focal left sided weakness on arrival to the  ED for which a code stroke was initiated. She was outside of the time window for IVTPA and not eligible for thrombectomy due to lack of LVO.   #R Frontal, Parietal Occipital Stroke in the setting of symptomatic carotid stenosis  Patient presents with the symptoms described above. MRI Brain  revealed right frontal, parietal occipital stroke. Vessel imaging w/CTA Head and Neck was pertinent for right carotid stenosis- 75 %. Echo w/EF 60 to 65 % LA normal in size. Stroke labs w/LDL uncalculated due to severely elevated triglycerides. Hemoglobin A1C 7.1. Her stroke is in the setting of atherosclerosis. - Vascular surgery consulted; planning for right TCAR 05/16/21 - Continue Aspirin 81 mg QD + Plavix 75 mg for secondary stroke prevention. Vascular surgery to determine duration of DAPT.  - Continue Atorvastatin 40 mg for secondary stroke prevention  - At discharge please place ambulatory referral to neurology for stroke follow up   #Hypertension She has a history of HTN and takes HCTZ 25 mg QD + Lisinopril 5 mg QD at home. Will allow for liberal blood pressure up to 160 given carotid stenosis.  - Do not initiate home BP medications at this time  - Liberalize SBP up to 160   #Hyperlipidemia From a stroke prevention stand point, the LDL goal is < 70. LDL unable to be calculated. Given carotid stenosis her home Atorvastatin 20 was increased to 40 mg.  - Repeat Lipid panel on an outpatient basis in the future to evaluate LDL level   #Type 1 Diabetes  Hemoglobin A1C 7.1, very close to goal < 7 from a stroke reduction standpoint. Recommend SSI for management of diabetes inpatient.  - Continue SSI   Hospital day # 2  Stark Jock, NP  [facility] Nurse Practitioner Patient seen and discussed with attending physician Dr. Roda Shutters   ATTENDING NOTE: I reviewed above note and agree with the assessment and plan. Pt was seen and examined.   No acute event overnight, neuro stable.  Vascular surgery Dr. Myra Gianotti on board, will perform TCAR procedure tomorrow.  Blood culture so far NGTD, WBC has normalized, today 8.0.  No concern of endocarditis at this time.  Continue aspirin and Plavix DAPT, as well as Lipitor and fenofibrate.  BP goal 130-160 before TCAR procedure.  BP stable, continue  on IV fluid.  N.p.o. after midnight.  We will follow.  Marvel Plan, MD PhD Stroke Neurology 05/15/2021 7:34 AM   To contact Stroke Continuity provider, please refer to [email or web address]. After hours, contact General Neurology

## 2021-05-15 NOTE — Plan of Care (Signed)
  Problem: Education: Goal: Knowledge of General Education information will improve Description: Including pain rating scale, medication(s)/side effects and non-pharmacologic comfort measures Outcome: Progressing   Problem: Health Behavior/Discharge Planning: Goal: Ability to manage health-related needs will improve Outcome: Progressing   Problem: Clinical Measurements: Goal: Cardiovascular complication will be avoided Outcome: Progressing   Problem: Pain Managment: Goal: General experience of comfort will improve Outcome: Progressing   Problem: Safety: Goal: Ability to remain free from injury will improve Outcome: Progressing   Problem: Education: Goal: Knowledge of disease or condition will improve Outcome: Progressing Goal: Knowledge of secondary prevention will improve Outcome: Progressing Goal: Knowledge of patient specific risk factors addressed and post discharge goals established will improve Outcome: Progressing Goal: Individualized Educational Video(s) Outcome: Progressing   Problem: Ischemic Stroke/TIA Tissue Perfusion: Goal: Complications of ischemic stroke/TIA will be minimized Outcome: Progressing

## 2021-05-15 NOTE — Plan of Care (Signed)

## 2021-05-16 ENCOUNTER — Encounter (HOSPITAL_COMMUNITY): Payer: Self-pay | Admitting: Internal Medicine

## 2021-05-16 ENCOUNTER — Encounter (HOSPITAL_COMMUNITY)
Admission: EM | Disposition: A | Discharge status: Home / Self Care | Payer: Self-pay | Source: Home / Self Care | Attending: Internal Medicine

## 2021-05-16 ENCOUNTER — Inpatient Hospital Stay (HOSPITAL_COMMUNITY): Payer: Medicare HMO | Admitting: Certified Registered Nurse Anesthetist

## 2021-05-16 ENCOUNTER — Inpatient Hospital Stay (HOSPITAL_COMMUNITY): Payer: Medicare HMO

## 2021-05-16 DIAGNOSIS — I6521 Occlusion and stenosis of right carotid artery: Secondary | ICD-10-CM

## 2021-05-16 HISTORY — PX: TRANSCAROTID ARTERY REVASCULARIZATIONÂ: SHX6778

## 2021-05-16 HISTORY — PX: ULTRASOUND GUIDANCE FOR VASCULAR ACCESS: SHX6516

## 2021-05-16 LAB — POCT ACTIVATED CLOTTING TIME: Activated Clotting Time: 237 seconds

## 2021-05-16 LAB — HEMOGLOBIN A1C
Hgb A1c MFr Bld: 7.3 % — ABNORMAL HIGH (ref 4.8–5.6)
Mean Plasma Glucose: 163 mg/dL

## 2021-05-16 LAB — GLUCOSE, CAPILLARY
Glucose-Capillary: 132 mg/dL — ABNORMAL HIGH (ref 70–99)
Glucose-Capillary: 123 mg/dL — ABNORMAL HIGH (ref 70–99)
Glucose-Capillary: 140 mg/dL — ABNORMAL HIGH (ref 70–99)
Glucose-Capillary: 187 mg/dL — ABNORMAL HIGH (ref 70–99)
Glucose-Capillary: 174 mg/dL — ABNORMAL HIGH (ref 70–99)

## 2021-05-16 LAB — BASIC METABOLIC PANEL
Anion gap: 10 (ref 5–15)
BUN: 12 mg/dL (ref 8–23)
CO2: 21 mmol/L — ABNORMAL LOW (ref 22–32)
Calcium: 9.1 mg/dL (ref 8.9–10.3)
Chloride: 106 mmol/L (ref 98–111)
Creatinine, Ser: 0.76 mg/dL (ref 0.44–1.00)
GFR, Estimated: >60 mL/min (ref >60)
Glucose, Bld: 128 mg/dL — ABNORMAL HIGH (ref 70–99)
Potassium: 4.2 mmol/L (ref 3.5–5.1)
Sodium: 137 mmol/L (ref 135–145)

## 2021-05-16 LAB — CBC
HCT: 38.3 % (ref 36.0–46.0)
Hemoglobin: 12.3 g/dL (ref 12.0–15.0)
MCH: 29.2 pg (ref 26.0–34.0)
MCHC: 32.1 g/dL (ref 30.0–36.0)
MCV: 91 fL (ref 80.0–100.0)
Platelets: 334 10*3/uL (ref 150–400)
RBC: 4.21 MIL/uL (ref 3.87–5.11)
RDW: 13.9 % (ref 11.5–15.5)
WBC: 7.2 10*3/uL (ref 4.0–10.5)
nRBC: 0 % (ref 0.0–0.2)

## 2021-05-16 LAB — TYPE AND SCREEN
ABO/RH(D): O POS
Antibody Screen: NEGATIVE

## 2021-05-16 LAB — ABO/RH: ABO/RH(D): O POS

## 2021-05-16 SURGERY — TRANSCAROTID ARTERY REVASCULARIZATION (TCAR)
Anesthesia: General | Site: Neck | Laterality: Right

## 2021-05-16 MED ORDER — HEMOSTATIC AGENTS (NO CHARGE) OPTIME
TOPICAL | Status: DC | PRN
  Administered 2021-05-16: 1 via TOPICAL

## 2021-05-16 MED ORDER — PHENYLEPHRINE 40 MCG/ML (10ML) SYRINGE FOR IV PUSH (FOR BLOOD PRESSURE SUPPORT)
PREFILLED_SYRINGE | INTRAVENOUS | Status: DC | PRN
  Administered 2021-05-16 (×3): 40 ug via INTRAVENOUS

## 2021-05-16 MED ORDER — SUGAMMADEX SODIUM 200 MG/2ML IV SOLN
INTRAVENOUS | Status: DC | PRN
  Administered 2021-05-16: 200 mg via INTRAVENOUS

## 2021-05-16 MED ORDER — ORAL CARE MOUTH RINSE: 15.0000 mL | Freq: Once | OROMUCOSAL | Status: AC

## 2021-05-16 MED ORDER — ROCURONIUM BROMIDE 10 MG/ML (PF) SYRINGE
PREFILLED_SYRINGE | INTRAVENOUS | Status: AC
  Filled 2021-05-16: qty 20

## 2021-05-16 MED ORDER — MEPERIDINE HCL 25 MG/ML IJ SOLN: 6.2500 mg | INTRAMUSCULAR | Status: DC | PRN

## 2021-05-16 MED ORDER — SODIUM CHLORIDE 0.9 % IV SOLN
INTRAVENOUS | Status: DC | PRN
  Administered 2021-05-16: 500 mL

## 2021-05-16 MED ORDER — SODIUM CHLORIDE 0.9 % IV SOLN: 500.0000 mL | Freq: Once | INTRAVENOUS | Status: DC | PRN

## 2021-05-16 MED ORDER — PROTAMINE SULFATE 10 MG/ML IV SOLN
INTRAVENOUS | Status: DC | PRN
  Administered 2021-05-16: 10 mg via INTRAVENOUS
  Administered 2021-05-16: 40 mg via INTRAVENOUS

## 2021-05-16 MED ORDER — PHENOL 1.4 % MT LIQD: 1.0000 | OROMUCOSAL | Status: DC | PRN

## 2021-05-16 MED ORDER — GLYCOPYRROLATE 0.2 MG/ML IJ SOLN
INTRAMUSCULAR | Status: DC | PRN
  Administered 2021-05-16 (×2): .2 mg via INTRAVENOUS

## 2021-05-16 MED ORDER — DEXAMETHASONE SODIUM PHOSPHATE 10 MG/ML IJ SOLN
INTRAMUSCULAR | Status: DC | PRN
  Administered 2021-05-16: 8 mg via INTRAVENOUS

## 2021-05-16 MED ORDER — GUAIFENESIN-DM 100-10 MG/5ML PO SYRP: 15.0000 mL | ORAL_SOLUTION | ORAL | Status: DC | PRN

## 2021-05-16 MED ORDER — ALUM & MAG HYDROXIDE-SIMETH 200-200-20 MG/5ML PO SUSP: 15.0000 mL | ORAL | Status: DC | PRN

## 2021-05-16 MED ORDER — FENTANYL CITRATE (PF) 100 MCG/2ML IJ SOLN
25.0000 ug | INTRAMUSCULAR | Status: DC | PRN
  Administered 2021-05-16: 50 ug via INTRAVENOUS

## 2021-05-16 MED ORDER — PROPOFOL 10 MG/ML IV BOLUS
INTRAVENOUS | Status: DC | PRN
  Administered 2021-05-16: 40 mg via INTRAVENOUS
  Administered 2021-05-16: 120 mg via INTRAVENOUS
  Administered 2021-05-16: 20 mg via INTRAVENOUS

## 2021-05-16 MED ORDER — PROPOFOL 10 MG/ML IV BOLUS
INTRAVENOUS | Status: AC
  Filled 2021-05-16: qty 20

## 2021-05-16 MED ORDER — LIDOCAINE HCL (PF) 2 % IJ SOLN
INTRAMUSCULAR | Status: AC
  Filled 2021-05-16: qty 10

## 2021-05-16 MED ORDER — SENNOSIDES-DOCUSATE SODIUM 8.6-50 MG PO TABS: 1.0000 | ORAL_TABLET | Freq: Every evening | ORAL | Status: DC | PRN

## 2021-05-16 MED ORDER — DEXAMETHASONE SODIUM PHOSPHATE 10 MG/ML IJ SOLN
INTRAMUSCULAR | Status: AC
  Filled 2021-05-16: qty 1

## 2021-05-16 MED ORDER — LACTATED RINGERS IV SOLN: INTRAVENOUS | Status: DC | PRN

## 2021-05-16 MED ORDER — FENTANYL CITRATE (PF) 100 MCG/2ML IJ SOLN
INTRAMUSCULAR | Status: AC
  Filled 2021-05-16: qty 2

## 2021-05-16 MED ORDER — CHLORHEXIDINE GLUCONATE 0.12 % MT SOLN
OROMUCOSAL | Status: AC
  Filled 2021-05-16: qty 15

## 2021-05-16 MED ORDER — LIDOCAINE 2% (20 MG/ML) 5 ML SYRINGE
INTRAMUSCULAR | Status: DC | PRN
  Administered 2021-05-16: 80 mg via INTRAVENOUS

## 2021-05-16 MED ORDER — HEPARIN SODIUM (PORCINE) 1000 UNIT/ML IJ SOLN
INTRAMUSCULAR | Status: DC | PRN
  Administered 2021-05-16: 9000 [IU] via INTRAVENOUS
  Administered 2021-05-16: 1000 [IU] via INTRAVENOUS

## 2021-05-16 MED ORDER — CHLORHEXIDINE GLUCONATE 0.12 % MT SOLN
15.0000 mL | Freq: Once | OROMUCOSAL | Status: AC
  Administered 2021-05-16: 15 mL via OROMUCOSAL

## 2021-05-16 MED ORDER — DOCUSATE SODIUM 100 MG PO CAPS
100.0000 mg | ORAL_CAPSULE | Freq: Every day | ORAL | Status: DC
  Administered 2021-05-17: 100 mg via ORAL
  Filled 2021-05-16: qty 1

## 2021-05-16 MED ORDER — LIDOCAINE HCL (PF) 1 % IJ SOLN
INTRAMUSCULAR | Status: AC
  Filled 2021-05-16: qty 30

## 2021-05-16 MED ORDER — POTASSIUM CHLORIDE CRYS ER 20 MEQ PO TBCR: 20.0000 meq | EXTENDED_RELEASE_TABLET | Freq: Every day | ORAL | Status: DC | PRN

## 2021-05-16 MED ORDER — EPHEDRINE 5 MG/ML INJ
INTRAVENOUS | Status: AC
  Filled 2021-05-16: qty 10

## 2021-05-16 MED ORDER — ONDANSETRON HCL 4 MG/2ML IJ SOLN
INTRAMUSCULAR | Status: DC | PRN
  Administered 2021-05-16: 4 mg via INTRAVENOUS

## 2021-05-16 MED ORDER — ONDANSETRON HCL 4 MG/2ML IJ SOLN: 4.0000 mg | Freq: Four times a day (QID) | INTRAMUSCULAR | Status: DC | PRN

## 2021-05-16 MED ORDER — FENTANYL CITRATE (PF) 250 MCG/5ML IJ SOLN
INTRAMUSCULAR | Status: AC
  Filled 2021-05-16: qty 5

## 2021-05-16 MED ORDER — HYDRALAZINE HCL 20 MG/ML IJ SOLN: 5.0000 mg | INTRAMUSCULAR | Status: DC | PRN

## 2021-05-16 MED ORDER — 0.9 % SODIUM CHLORIDE (POUR BTL) OPTIME
TOPICAL | Status: DC | PRN
  Administered 2021-05-16: 1000 mL

## 2021-05-16 MED ORDER — IODIXANOL 320 MG/ML IV SOLN
INTRAVENOUS | Status: DC | PRN
  Administered 2021-05-16: 23 mL via INTRA_ARTERIAL

## 2021-05-16 MED ORDER — PHENYLEPHRINE 40 MCG/ML (10ML) SYRINGE FOR IV PUSH (FOR BLOOD PRESSURE SUPPORT)
PREFILLED_SYRINGE | INTRAVENOUS | Status: AC
  Filled 2021-05-16: qty 10

## 2021-05-16 MED ORDER — PHENYLEPHRINE HCL-NACL 10-0.9 MG/250ML-% IV SOLN
INTRAVENOUS | Status: DC | PRN
  Administered 2021-05-16: 25 ug/min via INTRAVENOUS

## 2021-05-16 MED ORDER — MAGNESIUM SULFATE 2 GM/50ML IV SOLN: 2.0000 g | Freq: Every day | INTRAVENOUS | Status: DC | PRN

## 2021-05-16 MED ORDER — CEFAZOLIN SODIUM-DEXTROSE 2-4 GM/100ML-% IV SOLN
2.0000 g | Freq: Three times a day (TID) | INTRAVENOUS | Status: AC
  Administered 2021-05-16 – 2021-05-17 (×2): 2 g via INTRAVENOUS
  Filled 2021-05-16 (×2): qty 100

## 2021-05-16 MED ORDER — ROCURONIUM BROMIDE 10 MG/ML (PF) SYRINGE
PREFILLED_SYRINGE | INTRAVENOUS | Status: DC | PRN
  Administered 2021-05-16: 20 mg via INTRAVENOUS
  Administered 2021-05-16: 50 mg via INTRAVENOUS

## 2021-05-16 MED ORDER — SODIUM CHLORIDE 0.9 % IV SOLN
INTRAVENOUS | Status: AC
  Filled 2021-05-16: qty 1.2

## 2021-05-16 MED ORDER — ONDANSETRON HCL 4 MG/2ML IJ SOLN
INTRAMUSCULAR | Status: AC
  Filled 2021-05-16: qty 2

## 2021-05-16 MED ORDER — PROTAMINE SULFATE 10 MG/ML IV SOLN
INTRAVENOUS | Status: AC
  Filled 2021-05-16: qty 5

## 2021-05-16 MED ORDER — METOPROLOL TARTRATE 5 MG/5ML IV SOLN: 2.0000 mg | INTRAVENOUS | Status: DC | PRN

## 2021-05-16 MED ORDER — EPHEDRINE SULFATE 50 MG/ML IJ SOLN
INTRAMUSCULAR | Status: DC | PRN
  Administered 2021-05-16 (×3): 5 mg via INTRAVENOUS

## 2021-05-16 MED ORDER — ETOMIDATE 2 MG/ML IV SOLN
INTRAVENOUS | Status: AC
  Filled 2021-05-16: qty 10

## 2021-05-16 MED ORDER — PROMETHAZINE HCL 25 MG/ML IJ SOLN: 6.2500 mg | INTRAMUSCULAR | Status: DC | PRN

## 2021-05-16 MED ORDER — ESMOLOL HCL 100 MG/10ML IV SOLN
INTRAVENOUS | Status: AC
  Filled 2021-05-16: qty 10

## 2021-05-16 MED ORDER — FENTANYL CITRATE (PF) 250 MCG/5ML IJ SOLN
INTRAMUSCULAR | Status: DC | PRN
  Administered 2021-05-16: 50 ug via INTRAVENOUS
  Administered 2021-05-16: 100 ug via INTRAVENOUS

## 2021-05-16 MED ORDER — LABETALOL HCL 5 MG/ML IV SOLN: 10.0000 mg | INTRAVENOUS | Status: DC | PRN

## 2021-05-16 MED ORDER — ESMOLOL HCL 100 MG/10ML IV SOLN
INTRAVENOUS | Status: DC | PRN
  Administered 2021-05-16: 20 mg via INTRAVENOUS

## 2021-05-16 MED ORDER — LACTATED RINGERS IV SOLN: INTRAVENOUS | Status: DC

## 2021-05-16 SURGICAL SUPPLY — 51 items
BAG BANDED W/RUBBER/TAPE 36X54 (MISCELLANEOUS) ×3 IMPLANT
BALLN STERLING RX 5X30X80 (BALLOONS) ×3
BALLOON STERLING RX 5X30X80 (BALLOONS) IMPLANT
CANISTER SUCT 3000ML PPV (MISCELLANEOUS) ×3 IMPLANT
CATH SUCT 10FR WHISTLE TIP (CATHETERS) ×3 IMPLANT
CLIP VESOCCLUDE MED 6/CT (CLIP) ×3 IMPLANT
CLIP VESOCCLUDE SM WIDE 6/CT (CLIP) ×3 IMPLANT
COVER DOME SNAP 22 D (MISCELLANEOUS) ×3 IMPLANT
COVER PROBE W GEL 5X96 (DRAPES) ×3 IMPLANT
DERMABOND ADVANCED (GAUZE/BANDAGES/DRESSINGS) ×2
DERMABOND ADVANCED .7 DNX12 (GAUZE/BANDAGES/DRESSINGS) ×2 IMPLANT
DRAPE FEMORAL ANGIO 80X135IN (DRAPES) ×3 IMPLANT
ELECT REM PT RETURN 9FT ADLT (ELECTROSURGICAL) ×3
ELECTRODE REM PT RTRN 9FT ADLT (ELECTROSURGICAL) ×2 IMPLANT
GLOVE BIOGEL PI IND STRL 7.5 (GLOVE) ×2 IMPLANT
GLOVE BIOGEL PI INDICATOR 7.5 (GLOVE) ×1
GLOVE SURG POLYISO LF SZ7.5 (GLOVE) ×3 IMPLANT
GOWN STRL REUS W/ TWL LRG LVL3 (GOWN DISPOSABLE) ×4 IMPLANT
GOWN STRL REUS W/ TWL XL LVL3 (GOWN DISPOSABLE) ×2 IMPLANT
GOWN STRL REUS W/TWL LRG LVL3 (GOWN DISPOSABLE) ×6
GOWN STRL REUS W/TWL XL LVL3 (GOWN DISPOSABLE) ×3
GUIDEWIRE ENROUTE 0.014 (WIRE) ×3 IMPLANT
HEMOSTAT SNOW SURGICEL 2X4 (HEMOSTASIS) IMPLANT
INTRODUCER KIT GALT 7CM (INTRODUCER) ×3
KIT BASIN OR (CUSTOM PROCEDURE TRAY) ×3 IMPLANT
KIT ENCORE 26 ADVANTAGE (KITS) ×3 IMPLANT
KIT INTRODUCER GALT 7 (INTRODUCER) ×2 IMPLANT
KIT TURNOVER KIT B (KITS) ×3 IMPLANT
NDL HYPO 25GX1X1/2 BEV (NEEDLE) IMPLANT
NEEDLE HYPO 25GX1X1/2 BEV (NEEDLE) IMPLANT
PACK CAROTID (CUSTOM PROCEDURE TRAY) ×3 IMPLANT
POSITIONER HEAD DONUT 9IN (MISCELLANEOUS) ×3 IMPLANT
SET MICROPUNCTURE 5F STIFF (MISCELLANEOUS) ×1 IMPLANT
STENT TRANSCAROTID SYSTEM 9X40 (Permanent Stent) ×1 IMPLANT
SURGIFLO W/THROMBIN 8M KIT (HEMOSTASIS) ×1 IMPLANT
SUT PROLENE 5 0 C 1 24 (SUTURE) ×5 IMPLANT
SUT PROLENE 6 0 BV (SUTURE) IMPLANT
SUT SILK 2 0 PERMA HAND 18 BK (SUTURE) ×2 IMPLANT
SUT SILK 2 0 SH (SUTURE) ×4 IMPLANT
SUT VIC AB 3-0 SH 27 (SUTURE) ×3
SUT VIC AB 3-0 SH 27X BRD (SUTURE) ×4 IMPLANT
SUT VIC AB 3-0 X1 27 (SUTURE) ×1 IMPLANT
SUT VIC AB 4-0 PS2 27 (SUTURE) ×3 IMPLANT
SYR 10ML LL (SYRINGE) ×9 IMPLANT
SYR 20ML LL LF (SYRINGE) ×3 IMPLANT
SYR CONTROL 10ML LL (SYRINGE) IMPLANT
SYSTEM TRANSCAROTID NEUROPRTCT (MISCELLANEOUS) ×2 IMPLANT
TOWEL GREEN STERILE (TOWEL DISPOSABLE) ×3 IMPLANT
TRANSCAROTID NEUROPROTECT SYS (MISCELLANEOUS) ×3
WATER STERILE IRR 1000ML POUR (IV SOLUTION) ×3 IMPLANT
WIRE BENTSON .035X145CM (WIRE) ×3 IMPLANT

## 2021-05-16 NOTE — Anesthesia Preprocedure Evaluation (Addendum)
Anesthesia Evaluation  Patient identified by MRN, date of birth, ID band Patient awake    Reviewed: Allergy & Precautions, NPO status , Patient's Chart, lab work & pertinent test results  Airway Mallampati: II  TM Distance: >3 FB Neck ROM: Full    Dental  (+) Dental Advisory Given   Pulmonary Current Smoker,    Pulmonary exam normal breath sounds clear to auscultation       Cardiovascular hypertension, Pt. on medications Normal cardiovascular exam Rhythm:Regular Rate:Normal  Echo 04/2021  1. Left ventricular ejection fraction, by estimation, is 60 to 65%. The left ventricle has normal function. The left ventricle has no regional wall motion abnormalities. Left ventricular diastolic parameters were normal.  2. Right ventricular systolic function is normal. The right ventricular size is normal. Tricuspid regurgitation signal is inadequate for assessing PA pressure.  3. The mitral valve is normal in structure. No evidence of mitral valve regurgitation. No evidence of mitral stenosis.  4. The aortic valve is tricuspid. Aortic valve regurgitation is not visualized. No aortic stenosis is present.   Neuro/Psych Back surgery CVA negative psych ROS   GI/Hepatic negative GI ROS, Neg liver ROS,   Endo/Other  diabetesHypothyroidism   Renal/GU negative Renal ROS  negative genitourinary   Musculoskeletal  (+) Arthritis ,   Abdominal (+) + obese,   Peds  Hematology   Anesthesia Other Findings   Reproductive/Obstetrics                           Anesthesia Physical  Anesthesia Plan  ASA: 3  Anesthesia Plan: General   Post-op Pain Management:    Induction: Intravenous  PONV Risk Score and Plan: 2 and Ondansetron, Dexamethasone, Treatment may vary due to age or medical condition and Midazolam  Airway Management Planned: Oral ETT  Additional Equipment: Arterial line  Intra-op Plan:    Post-operative Plan: Extubation in OR  Informed Consent: I have reviewed the patients History and Physical, chart, labs and discussed the procedure including the risks, benefits and alternatives for the proposed anesthesia with the patient or authorized representative who has indicated his/her understanding and acceptance.     Dental advisory given  Plan Discussed with: CRNA  Anesthesia Plan Comments: (2 x PIV)        Anesthesia Quick Evaluation

## 2021-05-16 NOTE — Progress Notes (Signed)
STROKE TEAM PROGRESS NOTE   Interval History   Granddaughter at bedside.  Patient was seen after TCAR procedure.  Doing well, stated that her headache was gone when she woke up from surgery.   Pertinent Lab Work and Imaging    CT Head WO IV Contrast Small acute infarction of the right parietooccipital lobes. There is no proximal intracranial vessel occlusion. 75% stenosis at the right ICA origin is noted and this may reflect a distal embolic or MCA/PCA watershed infarct.   CT Angio Head and Neck W WO IV Contrast CTA NECK   Aortic arch: No significant plaque along the aortic arch. Great vessel origins are patent.   Right carotid system: There is mixed but primarily noncalcified plaque at the bifurcation and proximal internal carotid causing 75% stenosis.   Left carotid system: Patent. Trace calcified plaque at the ICA origin without stenosis.   Vertebral arteries: Patent. Right vertebral artery is dominant. Minimal calcified plaque along the right V2 segment. No stenosis.   Skeleton: Degenerative changes of the included spine.   Other neck: Unremarkable.   Upper chest: No apical lung mass.   CTA HEAD   Anterior circulation: Intracranial internal carotid arteries are patent with mild calcified plaque. Proximal anterior and middle cerebral arteries are patent.   Posterior circulation: Intracranial vertebral arteries are patent. Left vertebral artery becomes diminutive after PICA origin. Basilar artery is patent. Major cerebellar artery origins are patent. Posterior cerebral arteries are patent.   Venous sinuses: Patent as allowed by contrast bolus timing.  MRI Brain WO IV Contrast Acute cortical/subcortical infarcts of the right frontal, parietal, and occipital lobes. Minor petechial hemorrhage in the right parieto-occipital region. Minor chronic microvascular ischemic changes.  Echocardiogram Complete   1. Left ventricular ejection fraction, by estimation, is 60 to 65%. The  left ventricle has normal function. The left ventricle has no regional wall motion abnormalities. Left ventricular diastolic parameters were normal.   2. Right ventricular systolic function is normal. The right ventricular size is normal. Tricuspid regurgitation signal is inadequate for assessing PA pressure.   3. The mitral valve is normal in structure. No evidence of mitral valve regurgitation. No evidence of mitral stenosis.   4. The aortic valve is tricuspid. Aortic valve regurgitation is not visualized. No aortic stenosis is present.   Physical Examination   Constitutional: Calm, appropriate for condition  Cardiovascular: Normal RR Respiratory: No increased WOB   Mental status: AAOx4, following commands  Speech: Fluent with repetition and naming intact. No dysarthria  Cranial nerves: EOMI, VFF, Face symmetric, Tongue midline, Shoulder shrug intact   Motor: Normal bulk and tone. Pronator drift noted to the left arm.  Dlt Bic Tri FgS Grp HF  KnF KnE PIF DoF  R 5 5 5 5 5 5 5 5 5 5   L 4 5 5 5 5 5 5 5 5 5   Sensory: Intact to light tough throughout  Coordination: Intact FNF  Gait: Deferred   NIHSS: 0   Assessment and Plan   Tiffany Navarro is a 65 y.o. female w/pmh of type 1 diabetes, hypertension, hyperlipidemia, hypothyroidism, osteoarthritis who presents with generalized weakness found to have focal left sided weakness on arrival to the ED for which a code stroke was initiated. She was outside of the time window for IVTPA and not eligible for thrombectomy due to lack of LVO.   #R Frontal, Parietal Occipital Stroke in the setting of symptomatic right carotid stenosis  Patient presents with the symptoms described  above. MRI Brain revealed right frontal, parietal occipital stroke. Vessel imaging w/CTA Head and Neck was pertinent for right carotid stenosis- 75 %. Echo w/EF 60 to 65 % LA normal in size. Stroke labs w/LDL uncalculated due to severely elevated triglycerides. Hemoglobin  A1C 7.1. Her stroke is in the setting of atherosclerosis. - Vascular surgery consulted, s/p right TCAR 05/16/21 - Continue Aspirin 81 mg QD + Plavix 75 mg for secondary stroke prevention. Vascular surgery to determine duration of DAPT.  - Continue Atorvastatin 40 mg for secondary stroke prevention  - At discharge please place ambulatory referral to neurology for stroke follow up   #Hypertension She has a history of HTN and takes HCTZ 25 mg QD + Lisinopril 5 mg QD at home. Will allow for liberal blood pressure up to 160 given carotid stenosis.  - Do not initiate home BP medications at this time  - Liberalize SBP up to 160   #Hyperlipidemia From a stroke prevention stand point, the LDL goal is < 70. LDL unable to be calculated. Given carotid stenosis her home Atorvastatin 20 was increased to 40 mg.  - Repeat Lipid panel on an outpatient basis in the future to evaluate LDL level   #Type 1 Diabetes  Hemoglobin A1C 7.1, very close to goal < 7 from a stroke reduction standpoint. Recommend SSI for management of diabetes inpatient.  - Continue SSI   Hospital day # 3  Neurology will sign off. Please call with questions. Pt will follow up with stroke clinic NP at [facility] in about 4 weeks. Thanks for the consult.  Marvel Plan, MD PhD Stroke Neurology 05/16/2021 6:45 PM    To contact Stroke Continuity provider, please refer to [email or web address]. After hours, contact General Neurology

## 2021-05-16 NOTE — Discharge Instructions (Signed)
   [facility]  Discharge Instructions   Carotid Surgery  Please refer to the following instructions for your post-procedure care. Your surgeon or physician assistant will discuss any changes with you.  Activity  You are encouraged to walk as much as you can. You can slowly return to normal activities but must avoid strenuous activity and heavy lifting until your doctor tell you it's okay. Avoid activities such as vacuuming or swinging a golf club. You can drive after one week if you are comfortable and you are no longer taking prescription pain medications. It is normal to feel tired for serval weeks after your surgery. It is also normal to have difficulty with sleep habits, eating, and bowel movements after surgery. These will go away with time.  Bathing/Showering  Shower daily after you go home. Do not soak in a bathtub, hot tub, or swim until the incision heals completely.  Incision Care  Shower every day. Clean your incision with mild soap and water. Pat the area dry with a clean towel. You do not need a bandage unless otherwise instructed. Do not apply any ointments or creams to your incision. You may have skin glue on your incision. Do not peel it off. It will come off on its own in about one week. Your incision may feel thickened and raised for several weeks after your surgery. This is normal and the skin will soften over time.   For Men Only: It's okay to shave around the incision but do not shave the incision itself for 2 weeks. It is common to have numbness under your chin that could last for several months.  Diet  Resume your normal diet. There are no special food restrictions following this procedure. A low fat/low cholesterol diet is recommended for all patients with vascular disease. In order to heal from your surgery, it is CRITICAL to get adequate nutrition. Your body requires vitamins, minerals, and protein. Vegetables are the best source of  vitamins and minerals. Vegetables also provide the perfect balance of protein. Processed food has little nutritional value, so try to avoid this.  Medications  Resume taking all of your medications unless your doctor or physician assistant tells you not to. If your incision is causing pain, you may take over-the- counter pain relievers such as acetaminophen (Tylenol). If you were prescribed a stronger pain medication, please be aware these medications can cause nausea and constipation. Prevent nausea by taking the medication with a snack or meal. Avoid constipation by drinking plenty of fluids and eating foods with a high amount of fiber, such as fruits, vegetables, and grains.   Do not take Tylenol if you are taking prescription pain medications.  Follow Up  Our office will schedule a follow up appointment 2-3 weeks following discharge.  Please call us immediately for any of the following conditions  . Increased pain, redness, drainage (pus) from your incision site. . Fever of 101 degrees or higher. . If you should develop stroke (slurred speech, difficulty swallowing, weakness on one side of your body, loss of vision) you should call 911 and go to the nearest emergency room. .  Reduce your risk of vascular disease:  . Stop smoking. If you would like help call [facility] at 1-800-QUIT-NOW (1-800-784-8669) or [place] at 336-586-4000. . Manage your cholesterol . Maintain a desired weight . Control your diabetes . Keep your blood pressure down .  If you have any questions, please call the office at [phone]. 

## 2021-05-16 NOTE — Anesthesia Procedure Notes (Signed)
Procedure Name: Intubation Date/Time: 05/16/2021 10:39 AM Performed by: Inda Coke, CRNA Pre-anesthesia Checklist: Patient identified, Emergency Drugs available, Suction available and Patient being monitored Patient Re-evaluated:Patient Re-evaluated prior to induction Oxygen Delivery Method: Circle System Utilized Preoxygenation: Pre-oxygenation with 100% oxygen Induction Type: IV induction Ventilation: Mask ventilation without difficulty and Oral airway inserted - appropriate to patient size Laryngoscope Size: Mac and 3 Grade View: Grade I Tube type: Oral Tube size: 7.0 mm Number of attempts: 1 Airway Equipment and Method: Stylet and Oral airway Placement Confirmation: ETT inserted through vocal cords under direct vision, positive ETCO2 and breath sounds checked- equal and bilateral Secured at: 21 cm Tube secured with: Tape Dental Injury: Teeth and Oropharynx as per pre-operative assessment

## 2021-05-16 NOTE — Interval H&P Note (Signed)
History and Physical Interval Note:  05/16/2021 9:37 AM  Tiffany Navarro  has presented today for surgery, with the diagnosis of Carotid Stenosis w/CVA.  The various methods of treatment have been discussed with the patient and family. After consideration of risks, benefits and other options for treatment, the patient has consented to  Procedure(s): RIGHT TRANSCAROTID ARTERY REVASCULARIZATION (Right) as a surgical intervention.  The patient's history has been reviewed, patient examined, no change in status, stable for surgery.  I have reviewed the patient's chart and labs.  Questions were answered to the patient's satisfaction.     Durene Cal

## 2021-05-16 NOTE — Progress Notes (Signed)
Progress Note    Tiffany Navarro  VPX:[id number] DOB: 1956-01-15  DOA: 05/13/2021 PCP: Marletta Lor, NP      Brief Narrative:    Medical records reviewed and are as summarized below:  Tiffany Navarro is a 65 y.o. female with medical history significant of type 1 diabetes, hypertension, hyperlipidemia, osteoarthritis, hypothyroidism, who presented to the hospital with left-sided weakness, generalized weakness, aphasia and unsteady gait.  She was found to have acute embolic stroke involving the right frontal, parietal and occipital areas.  She also has mild petechiae in the region and right parieto-occipital region and 75% right ICA stenosis.  She was out of the window for tPA.    Assessment/Plan:   Principal Problem:   Acute embolic stroke [facility]) Active Problems:   Carotid stenosis, right    Body mass index is 32.17 kg/m.  (Obesity)   Acute embolic stroke involving the right frontal, parietal and occipital lobes, minor petechial hemorrhage in the right parieto-occipital region, 75% right ICA stenosis: Continue aspirin, Plavix and Lipitor.  Plan for transcarotid artery revascularization today.  Follow-up with neurologist and vascular surgeon.  2D echo showed EF estimated at 60 to 65%, normal LV diastolic parameters.  Type I DM, diabetic neuropathy: NovoLog as needed for hyperglycemia.  Glucose levels are okay for now.  Hemoglobin A1c is 7.1.  Hyperlipidemia:  LDL 35, HDL 44, triglycerides 119, total cholesterol 103.   Continue Lipitor and fenofibrate  Hypokalemia: Improved  Leukocytosis: Resolved  Anxiety: Xanax as needed   Diet Order             Diet NPO time specified Except for: Sips with Meds  Diet effective midnight                      Consultants: Neurologist Vascular surgeon  Procedures: Plan for TCAR today    Medications:    aspirin EC  81 mg Oral Daily   atorvastatin  40 mg Oral Daily   clopidogrel  75 mg Oral Daily    enoxaparin (LOVENOX) injection  40 mg Subcutaneous Q24H   fenofibrate  160 mg Oral Daily   insulin aspart  0-9 Units Subcutaneous TID WC   levothyroxine  112 mcg Oral Daily   melatonin  5 mg Oral QHS   pantoprazole  40 mg Oral Daily   pregabalin  50 mg Oral TID   Continuous Infusions:  sodium chloride 75 mL/hr at 05/16/21 0649    ceFAZolin (ANCEF) IV        Anti-infectives (From admission, onward)    Start     Dose/Rate Route Frequency Ordered Stop   05/16/21 0600  ceFAZolin (ANCEF) IVPB 2g/100 mL premix       Note to Pharmacy: Send with pt to OR   2 g 200 mL/hr over 30 Minutes Intravenous To Short Stay 05/15/21 0759 05/17/21 0600              Family Communication/Anticipated D/C date and plan/Code Status   DVT prophylaxis: enoxaparin (LOVENOX) injection 40 mg Start: 05/13/21 2200 SCDs Start: 05/13/21 1734     Code Status: Full Code  Family Communication: Tiffany Navarro, son Disposition Plan:    Status is: Inpatient  Remains inpatient appropriate because:IV treatments appropriate due to intensity of illness or inability to take PO  Dispo: The patient is from: Home              Anticipated d/c is to: Home  Patient currently is not medically stable to d/c.   Difficult to place patient No           Subjective:   Interval events noted.  She said she had a better night last night.  She slept well and she thinks Xanax helped a great deal.  No new complaints.  Objective:    Vitals:   05/15/21 1555 05/15/21 1930 05/15/21 2331 05/16/21 0407  BP: 140/74 (!) 146/74 104/90 133/75  Pulse: 72 80 73 71  Resp: 18 18 18 17   Temp: 98.4 F (36.9 C) 98.2 F (36.8 C) (!) 97.4 F (36.3 C) 98.7 F (37.1 C)  TempSrc: Oral Oral Oral Oral  SpO2: 100% 99% 97% 98%  Weight:      Height:       No data found.   Intake/Output Summary (Last 24 hours) at 05/16/2021 0720 Last data filed at 05/16/2021 0600 Gross per 24 hour  Intake 1909.92 ml  Output --  Net  1909.92 ml   Filed Weights   05/13/21 2320  Weight: 87.7 kg    Exam:  GEN: NAD SKIN: Warm and dry EYES: No pallor or icterus ENT: MMM CV: RRR PULM: CTA B ABD: soft, ND, NT, +BS CNS: AAO x 3, non focal EXT: No edema or tenderness          Data Reviewed:   I have personally reviewed following labs and imaging studies:  Labs: Labs show the following:   Basic Metabolic Panel: Recent Labs  Lab 05/13/21 1458 05/14/21 0514 05/14/21 1352 05/15/21 0127 05/16/21 0352  NA 137 136  --  137 137  K 4.3 3.4*  --  3.7 4.2  CL 102 100  --  105 106  CO2 24 25  --  23 21*  GLUCOSE 162* 99  --  146* 128*  BUN 24* 17  --  16 12  CREATININE 1.16* 1.06*  --  0.85 0.76  CALCIUM 9.4 9.1  --  8.7* 9.1  MG  --   --  2.1  --   --    GFR Estimated Creatinine Clearance: 77.7 mL/min (by C-G formula based on SCr of 0.76 mg/dL). Liver Function Tests: Recent Labs  Lab 05/13/21 1458 05/14/21 0514  AST 23 18  ALT 22 21  ALKPHOS 40 33*  BILITOT 0.4 0.6  PROT 7.9 6.5  ALBUMIN 4.0 3.4*   No results for input(s): LIPASE, AMYLASE in the last 168 hours. No results for input(s): AMMONIA in the last 168 hours. Coagulation profile Recent Labs  Lab 05/13/21 1458 05/14/21 0514  INR 1.1 1.2    CBC: Recent Labs  Lab 05/13/21 1458 05/13/21 1734 05/14/21 0514 05/15/21 0127 05/16/21 0352  WBC 21.9* 19.4* 13.2* 8.0 7.2  NEUTROABS 20.2*  --   --  5.0  --   HGB 13.7 12.5 12.4 12.2 12.3  HCT 41.2 38.9 38.5 37.6 38.3  MCV 89.0 89.2 88.9 88.9 91.0  PLT 378 336 308 321 334   Cardiac Enzymes: No results for input(s): CKTOTAL, CKMB, CKMBINDEX, TROPONINI in the last 168 hours. BNP (last 3 results) No results for input(s): PROBNP in the last 8760 hours. CBG: Recent Labs  Lab 05/15/21 0609 05/15/21 1145 05/15/21 1554 05/15/21 2115 05/16/21 0635  GLUCAP 132* 147* 140* 117* 132*   D-Dimer: No results for input(s): DDIMER in the last 72 hours. Hgb A1c: Recent Labs     05/14/21 0514 05/15/21 0127  HGBA1C 7.1* 7.3*   Lipid Profile: Recent Labs  05/14/21 0514  CHOL 103  HDL 44  LDLCALC 35  TRIG 119  CHOLHDL 2.3   Thyroid function studies: Recent Labs    05/13/21 1800  TSH 2.876   Anemia work up: No results for input(s): VITAMINB12, FOLATE, FERRITIN, TIBC, IRON, RETICCTPCT in the last 72 hours. Sepsis Labs: Recent Labs  Lab 05/13/21 1734 05/14/21 0514 05/15/21 0127 05/16/21 0352  WBC 19.4* 13.2* 8.0 7.2    Microbiology Recent Results (from the past 240 hour(s))  Resp Panel by RT-PCR (Flu A&B, Covid) Nasopharyngeal Swab     Status: None   Collection Time: 05/13/21  3:51 PM   Specimen: Nasopharyngeal Swab; Nasopharyngeal(NP) swabs in vial transport medium  Result Value Ref Range Status   SARS Coronavirus 2 by RT PCR NEGATIVE NEGATIVE Final    Comment: (NOTE) SARS-CoV-2 target nucleic acids are NOT DETECTED.  The SARS-CoV-2 RNA is generally detectable in upper respiratory specimens during the acute phase of infection. The lowest concentration of SARS-CoV-2 viral copies this assay can detect is 138 copies/mL. A negative result does not preclude SARS-Cov-2 infection and should not be used as the sole basis for treatment or other patient management decisions. A negative result may occur with  improper specimen collection/handling, submission of specimen other than nasopharyngeal swab, presence of viral mutation(s) within the areas targeted by this assay, and inadequate number of viral copies(<138 copies/mL). A negative result must be combined with clinical observations, patient history, and epidemiological information. The expected result is Negative.  Fact Sheet for Patients:  BloggerCourse.comhttps://www.fda.gov/media/152166/download  Fact Sheet for Healthcare Providers:  SeriousBroker.ithttps://www.fda.gov/media/152162/download  This test is no t yet approved or cleared by the [place] FDA and  has been authorized for detection and/or diagnosis of  SARS-CoV-2 by FDA under an Emergency Use Authorization (EUA). This EUA will remain  in effect (meaning this test can be used) for the duration of the COVID-19 declaration under Section 564(b)(1) of the Act, 21 U.S.C.section 360bbb-3(b)(1), unless the authorization is terminated  or revoked sooner.       Influenza A by PCR NEGATIVE NEGATIVE Final   Influenza B by PCR NEGATIVE NEGATIVE Final    Comment: (NOTE) The Xpert Xpress SARS-CoV-2/FLU/RSV plus assay is intended as an aid in the diagnosis of influenza from Nasopharyngeal swab specimens and should not be used as a sole basis for treatment. Nasal washings and aspirates are unacceptable for Xpert Xpress SARS-CoV-2/FLU/RSV testing.  Fact Sheet for Patients: BloggerCourse.comhttps://www.fda.gov/media/152166/download  Fact Sheet for Healthcare Providers: SeriousBroker.ithttps://www.fda.gov/media/152162/download  This test is not yet approved or cleared by the [place] FDA and has been authorized for detection and/or diagnosis of SARS-CoV-2 by FDA under an Emergency Use Authorization (EUA). This EUA will remain in effect (meaning this test can be used) for the duration of the COVID-19 declaration under Section 564(b)(1) of the Act, 21 U.S.C. section 360bbb-3(b)(1), unless the authorization is terminated or revoked.  Performed at [facility], [address] [place], [place] [address]   Culture, blood (routine x 2)     Status: None (Preliminary result)   Collection Time: 05/13/21  4:11 PM   Specimen: BLOOD  Result Value Ref Range Status   Specimen Description   Final    BLOOD RIGHT ANTECUBITAL Performed at [facility], [address] [place], [place] [address]    Special Requests   Final    BOTTLES DRAWN AEROBIC AND ANAEROBIC Blood Culture adequate volume Performed at [facility], [address] [place], [place] [address]  Culture   Final    NO GROWTH 2 DAYS Performed at [facility], [address], [place], [place] [address]    Report Status PENDING  Incomplete  Culture, blood (routine x 2)     Status: None (Preliminary result)   Collection Time: 05/13/21  4:16 PM   Specimen: BLOOD  Result Value Ref Range Status   Specimen Description   Final    BLOOD LEFT ANTECUBITAL Performed at [facility], [address], [place], [place] [address]    Special Requests   Final    BOTTLES DRAWN AEROBIC ONLY Blood Culture adequate volume Performed at [facility], [address] [place], [place] [address]    Culture   Final    NO GROWTH 2 DAYS Performed at [facility], [address] [place], [place] [address]    Report Status PENDING  Incomplete    Procedures and diagnostic studies:  ECHOCARDIOGRAM COMPLETE  Result Date: 05/14/2021    ECHOCARDIOGRAM REPORT   Patient Name:   CONCETTA GUION Date of Exam: 05/14/2021 Medical Rec #:  [id number]         Height:       65.0 in Accession #:    [id number]        Weight:       193.3 lb Date of Birth:  11-24-1956          BSA:          1.950 m Patient Age:    64 years          BP:           115/70 mmHg Patient Gender: F                 HR:           78 bpm. Exam Location:  Inpatient Procedure: 2D Echo, Cardiac Doppler and Color Doppler Indications:    R94.31 Abnormal EKG  History:        Patient has no prior history of Echocardiogram examinations.                 Risk Factors:Hypertension. Hypothyroidism.  Sonographer:    Elmarie Shiley Dance Referring Phys: [id number] ELIZABETH G MATHEWS IMPRESSIONS  1. Left ventricular ejection fraction, by estimation, is 60 to 65%. The left ventricle has normal function. The left ventricle has no regional wall motion abnormalities. Left ventricular diastolic parameters were normal.  2. Right ventricular systolic function is normal. The right ventricular size is normal. Tricuspid regurgitation signal is inadequate for assessing PA pressure.  3. The mitral valve is  normal in structure. No evidence of mitral valve regurgitation. No evidence of mitral stenosis.  4. The aortic valve is tricuspid. Aortic valve regurgitation is not visualized. No aortic stenosis is present. Comparison(s): No prior Echocardiogram. Conclusion(s)/Recommendation(s): Otherwise normal echocardiogram, with minor abnormalities described in the report. FINDINGS  Left Ventricle: Left ventricular ejection fraction, by estimation, is 60 to 65%. The left ventricle has normal function. The left ventricle has no regional wall motion abnormalities. The left ventricular internal cavity size was normal in size. There is  no left ventricular hypertrophy. Left ventricular diastolic parameters were normal. Right Ventricle: The right ventricular size is normal. No increase in right ventricular wall thickness. Right ventricular systolic function is normal. Tricuspid regurgitation signal is inadequate for assessing PA pressure. Left Atrium: Left atrial size was normal in size. Right Atrium: Right atrial size was normal in size.  Pericardium: There is no evidence of pericardial effusion. Mitral Valve: The mitral valve is normal in structure. No evidence of mitral valve regurgitation. No evidence of mitral valve stenosis. Tricuspid Valve: The tricuspid valve is normal in structure. Tricuspid valve regurgitation is trivial. No evidence of tricuspid stenosis. Aortic Valve: The aortic valve is tricuspid. Aortic valve regurgitation is not visualized. No aortic stenosis is present. Pulmonic Valve: The pulmonic valve was grossly normal. Pulmonic valve regurgitation is mild. No evidence of pulmonic stenosis. Aorta: The aortic root and ascending aorta are structurally normal, with no evidence of dilitation. IAS/Shunts: The atrial septum is grossly normal.  LEFT VENTRICLE PLAX 2D LVIDd:         4.30 cm  Diastology LVIDs:         2.40 cm  LV e' medial:    7.94 cm/s LV PW:         0.80 cm  LV E/e' medial:  10.2 LV IVS:        0.80 cm   LV e' lateral:   9.03 cm/s LVOT diam:     2.00 cm  LV E/e' lateral: 9.0 LV SV:         58 LV SV Index:   30 LVOT Area:     3.14 cm  RIGHT VENTRICLE            IVC RV Basal diam:  2.30 cm    IVC diam: 2.00 cm RV S prime:     8.59 cm/s TAPSE (M-mode): 1.6 cm LEFT ATRIUM             Index       RIGHT ATRIUM           Index LA diam:        3.60 cm 1.85 cm/m  RA Area:     14.10 cm LA Vol (A2C):   45.8 ml 23.49 ml/m RA Volume:   31.20 ml  16.00 ml/m LA Vol (A4C):   43.0 ml 22.05 ml/m LA Biplane Vol: 44.7 ml 22.92 ml/m  AORTIC VALVE LVOT Vmax:   102.25 cm/s LVOT Vmean:  67.300 cm/s LVOT VTI:    0.186 m  AORTA Ao Root diam: 3.10 cm Ao Asc diam:  2.80 cm MITRAL VALVE MV Area (PHT): 3.21 cm    SHUNTS MV Decel Time: 236 msec    Systemic VTI:  0.19 m MV E velocity: 81.00 cm/s  Systemic Diam: 2.00 cm MV A velocity: 74.40 cm/s MV E/A ratio:  1.09 Riley Lam MD Electronically signed by Riley Lam MD Signature Date/Time: 05/14/2021/11:40:35 AM    Final                LOS: 3 days   Tarnisha Kachmar  [facility]   Pager on [email or web address]. If 7PM-7AM, please contact night-coverage at [email or web address]     05/16/2021, 7:20 AM

## 2021-05-16 NOTE — Op Note (Signed)
Patient name: Tiffany Navarro MRN: [id number] DOB: 25-Jul-1956 Sex: female  05/16/2021 Pre-operative Diagnosis: Symptomatic right carotid stenosis Post-operative diagnosis:  Same Surgeon:  Annamarie Major Assistants:  Ivin Booty, PA Procedure:   #1: Right carotid stent (TCAR)   #2: Retrograde flow reversal neuro protection   #3: Ultrasound-guided access, left femoral artery Anesthesia: General Blood Loss: Minimal Specimens: None  Findings: 60-70% carotid stenosis Stent:  9x40 Predilation balloon:  5x30 Total time:  49 minutes Fluoroscopy time:2.1 minutes Contrast:  23cc Dose area:  1278.14  Indications: This is a 65 year old female who presented to the hospital with a right brain stroke.  CT scan revealed 70% stenosis.  She comes in today for carotid stenting.  The risks and benefits were discussed with the patient preoperatively and she wanted to proceed.  Procedure:  The patient was identified in the holding area and taken to [place] [address]  The patient was then placed supine on the table. general anesthesia was administered.  The patient was prepped and draped in the usual sterile fashion.  A time out was called and antibiotics were administered.  A PA was necessary to expedite the procedure and assist with technical details.  The left femoral vein was evaluated with ultrasound and found to be widely patent and easily compressible.  The left common femoral vein was then cannulated under ultrasound guidance with a micropuncture needle.  A 018 wire was advanced without resistance and a micropuncture sheath was placed.  Next a Bentson wire was inserted followed by placement of the TCAR sheath.  Ultrasound was used to identify the location of the common carotid artery above the right clavicle.  A transverse incision was made just above the clavicle.  Cautery was used divide subcutaneous tissue and platysma muscle.  I then identified the 2 heads of the sternocleidomastoid and developed the  avascular plane between the muscle bellies.  Sharp dissection was used to dissect out the common carotid artery which was disease-free.  It was encircled proximally with an umbilical tape and a vessel loop.  The patient was fully heparinized.  The initial ACT was around 230 and additional heparin was given.  A 5-0 Prolene U stitch was placed on the anterior surface of the right common carotid artery.  The right common carotid artery was then cannulated with a micropuncture needle.  A 018 wire was advanced up to the mark and a micropuncture sheath was inserted for 3 cm.  A contrast injection was then performed to locate the carotid bifurcation.  A carotid Amplatz wire was then inserted up to the mark on the screen and the TCAR sheath was inserted.  This was sutured into position with 2 silk sutures.  The flow reversal tubing was then connected and passive flow reversal was confirmed with a saline flush.  Next a TCAR timeout was performed.  Active flow reversal was initiated by occluding the proximal common carotid artery with the vessel loop.  Active flow reversal was confirmed with a saline flush.  Next the 018 wire and the 5 x 30 balloon were inserted.  Additional imaging was performed through the sheath.  The wire was advanced easily into the internal carotid artery.  Predilation balloon was then inflated across the lesion at 10 atm.  There was no hemodynamic response.  The balloon was then removed.  A 9 x 40 stent was then inserted and deployed.  I waited 2 minutes and then performed completion imaging and multiple oblique views which showed  a widely patent stent with no residual stenosis.  The wire was then removed.  Active flow reversal was discontinued.  The blood was returned to the patient through the venous cannula.  The carotid sheath was then removed and the arteriotomy closed by securing the previously placed 5-0 Prolene.  Hand-held Doppler was used to evaluate the signal in the common carotid artery  which was normal.  50 mg of protamine was then given.  The sheath was removed in the left groin and manual pressure was held for hemostasis.  The carotid incision was irrigated.  Once hemostasis was satisfactory the platysma muscle was closed with 3-0 Vicryl and the skin was closed with 3-0 Vicryl followed by Dermabond.  The patient was successfully extubated and taken to recovery in stable condition.  There are no immediate complications.   Disposition: To PACU stable.   Theotis Burrow, M.D., [facility] [place] Office: [phone] Pager:  [phone]

## 2021-05-16 NOTE — Transfer of Care (Signed)
Immediate Anesthesia Transfer of Care Note  Patient: Tiffany Navarro  Procedure(s) Performed: RIGHT TRANSCAROTID ARTERY REVASCULARIZATION (Right: Neck) ULTRASOUND GUIDANCE FOR VASCULAR ACCESS, left femoral vein (Left: Groin)  Patient Location: PACU  Anesthesia Type:General  Level of Consciousness: awake and alert   Airway & Oxygen Therapy: Patient Spontanous Breathing and Patient connected to nasal cannula oxygen  Post-op Assessment: Report given to RN, Post -op Vital signs reviewed and stable, Patient moving all extremities X 4 and Patient able to stick tongue midline  Post vital signs: Reviewed and stable  Last Vitals:  Vitals Value Taken Time  BP 116/74 05/16/21 1219  Temp    Pulse 87 05/16/21 1222  Resp 16 05/16/21 1222  SpO2 95 % 05/16/21 1222  Vitals shown include unvalidated device data.  Last Pain:  Vitals:   05/16/21 0937  TempSrc:   PainSc: 0-No pain      Patients Stated Pain Goal: 1 (05/15/21 2010)  Complications: No notable events documented.

## 2021-05-16 NOTE — Anesthesia Postprocedure Evaluation (Signed)
Anesthesia Post Note  Patient: Tiffany Navarro  Procedure(s) Performed: RIGHT TRANSCAROTID ARTERY REVASCULARIZATION (Right: Neck) ULTRASOUND GUIDANCE FOR VASCULAR ACCESS, left femoral vein (Left: Groin)     Patient location during evaluation: PACU Anesthesia Type: General Level of consciousness: awake and alert, oriented and patient cooperative Pain management: pain level controlled Vital Signs Assessment: post-procedure vital signs reviewed and stable Respiratory status: spontaneous breathing, nonlabored ventilation and respiratory function stable Cardiovascular status: blood pressure returned to baseline and stable Postop Assessment: no apparent nausea or vomiting Anesthetic complications: no   No notable events documented.  Last Vitals:  Vitals:   05/16/21 1505 05/16/21 1558  BP: 123/75 (!) 149/71  Pulse: 78 83  Resp: 14 17  Temp: (!) 36.1 C 36.8 C  SpO2: 91% 95%    Last Pain:  Vitals:   05/16/21 1725  TempSrc:   PainSc: 1                  Lannie Fields

## 2021-05-16 NOTE — Progress Notes (Signed)
OT Cancellation Note  Patient Details Name: Tiffany Navarro MRN: [id number] DOB: 1956-01-31   Cancelled Treatment:    Reason Eval/Treat Not Completed: Patient at procedure or test/ unavailable.  Off the floor for a procedure.  Continue efforts as appropriate.    Quinten Allerton D Horald Birky 05/16/2021, 2:16 PM

## 2021-05-16 NOTE — Anesthesia Procedure Notes (Signed)
Arterial Line Insertion Start/End6/17/2022 10:00 AM Performed by: Audie Pinto, CRNA, CRNA  Patient location: Pre-op. Preanesthetic checklist: patient identified and risks and benefits discussed Lidocaine 1% used for infiltration Left, radial was placed Catheter size: 20 G Hand hygiene performed  and maximum sterile barriers used  Allen's test indicative of satisfactory collateral circulation Attempts: 2 Procedure performed without using ultrasound guided technique. Following insertion, dressing applied and Biopatch. Post procedure assessment: unchanged  Patient tolerated the procedure well with no immediate complications.

## 2021-05-16 NOTE — Progress Notes (Signed)
PT Cancellation Note  Patient Details Name: [name] MRN: [id number] DOB: [birth year]   Cancelled Treatment:    Reason Eval/Treat Not Completed: Patient at procedure or test/unavailable Patient off unit at procedure. Will re-attempt as time allows.   [name] A. Dan Humphreys PT, DPT [facility] Pager [phone] Office [phone]    [name] 05/16/2021, 12:44 PM

## 2021-05-16 NOTE — Care Management Important Message (Signed)
Important Message  Patient Details  Name: Tiffany Navarro MRN: [id number] Date of Birth: 1956-11-27   Medicare Important Message Given:  Yes  Patient left prior to IM delivery will mail to the patient home address.   Adelisa Satterwhite 05/16/2021, 3:09 PM

## 2021-05-16 NOTE — Care Management Important Message (Signed)
Important Message  Patient Details  Name: Tiffany Navarro MRN: [id number] Date of Birth: Dec 28, 1955   Medicare Important Message Given:  Yes  Patient left prior to IM delivery will mail to home address.    Greogory Cornette 05/16/2021, 3:07 PM

## 2021-05-17 LAB — GLUCOSE, CAPILLARY
Glucose-Capillary: 142 mg/dL — ABNORMAL HIGH (ref 70–99)
Glucose-Capillary: 131 mg/dL — ABNORMAL HIGH (ref 70–99)

## 2021-05-17 LAB — CBC
HCT: 36 % (ref 36.0–46.0)
Hemoglobin: 11.9 g/dL — ABNORMAL LOW (ref 12.0–15.0)
MCH: 28.9 pg (ref 26.0–34.0)
MCHC: 33.1 g/dL (ref 30.0–36.0)
MCV: 87.4 fL (ref 80.0–100.0)
Platelets: 353 10*3/uL (ref 150–400)
RBC: 4.12 MIL/uL (ref 3.87–5.11)
RDW: 13.5 % (ref 11.5–15.5)
WBC: 10.5 10*3/uL (ref 4.0–10.5)
nRBC: 0 % (ref 0.0–0.2)

## 2021-05-17 LAB — BASIC METABOLIC PANEL
Anion gap: 9 (ref 5–15)
BUN: 11 mg/dL (ref 8–23)
CO2: 24 mmol/L (ref 22–32)
Calcium: 9.1 mg/dL (ref 8.9–10.3)
Chloride: 106 mmol/L (ref 98–111)
Creatinine, Ser: 0.72 mg/dL (ref 0.44–1.00)
GFR, Estimated: >60 mL/min (ref >60)
Glucose, Bld: 132 mg/dL — ABNORMAL HIGH (ref 70–99)
Potassium: 3.7 mmol/L (ref 3.5–5.1)
Sodium: 139 mmol/L (ref 135–145)

## 2021-05-17 LAB — LIPID PANEL
Cholesterol: 110 mg/dL (ref 0–200)
HDL: 36 mg/dL — ABNORMAL LOW (ref >40)
LDL Cholesterol: 42 mg/dL (ref 0–99)
Total CHOL/HDL Ratio: 3.1 RATIO
Triglycerides: 160 mg/dL — ABNORMAL HIGH (ref <150)
VLDL: 32 mg/dL (ref 0–40)

## 2021-05-17 MED ORDER — CONTOUR NEXT TEST VI STRP: 1.0000 | ORAL_STRIP | Freq: Three times a day (TID) | Status: AC

## 2021-05-17 MED ORDER — CLOPIDOGREL BISULFATE 75 MG PO TABS: 75.0000 mg | ORAL_TABLET | Freq: Every day | ORAL | 0 refills | Status: DC

## 2021-05-17 MED ORDER — BASAGLAR KWIKPEN 100 UNIT/ML ~~LOC~~ SOPN: 68.0000 [IU] | PEN_INJECTOR | SUBCUTANEOUS | Status: DC

## 2021-05-17 MED ORDER — ASPIRIN 81 MG PO TBEC: 81.0000 mg | DELAYED_RELEASE_TABLET | Freq: Every day | ORAL | Status: AC

## 2021-05-17 MED ORDER — ATORVASTATIN CALCIUM 40 MG PO TABS: 40.0000 mg | ORAL_TABLET | Freq: Every day | ORAL | 0 refills | Status: DC

## 2021-05-17 NOTE — Progress Notes (Addendum)
Progress Note    05/17/2021 7:50 AM 1 Day Post-Op  Subjective:  Neck incision a little sore. Reports immediately post-op right-sided headache for 3 hours, now completely resolved. She feels as though she had some persistent left visual field cut, but this was not described pre-operatively. No extremity weakness.   Vitals:   05/17/21 0400 05/17/21 0735  BP:  110/66  Pulse:  64  Resp:  15  Temp: 98.2 F (36.8 C) 98 F (36.7 C)  SpO2:  92%    Physical Exam: General appearance: Awake, alert in no apparent distress Neurologic: Alert and oriented x4, tongue midline, face symmetric, grip strength 5/5 bilaterally. Moves all extremities well. Cardiac: Heart rate and rhythm are regular Respirations: Nonlabored Incision: Well approximated without bleeding or hematoma.  Subcutaneous tissue soft to palpation Groin puncture site: Soft without hematoma or bleeding   CBC    Component Value Date/Time   WBC 10.5 05/17/2021 0508   RBC 4.12 05/17/2021 0508   HGB 11.9 (L) 05/17/2021 0508   HCT 36.0 05/17/2021 0508   PLT 353 05/17/2021 0508   MCV 87.4 05/17/2021 0508   MCH 28.9 05/17/2021 0508   MCHC 33.1 05/17/2021 0508   RDW 13.5 05/17/2021 0508   LYMPHSABS 2.0 05/15/2021 0127   MONOABS 0.5 05/15/2021 0127   EOSABS 0.4 05/15/2021 0127   BASOSABS 0.0 05/15/2021 0127    BMET    Component Value Date/Time   NA 139 05/17/2021 0508   K 3.7 05/17/2021 0508   CL 106 05/17/2021 0508   CO2 24 05/17/2021 0508   GLUCOSE 132 (H) 05/17/2021 0508   BUN 11 05/17/2021 0508   CREATININE 0.72 05/17/2021 0508   CREATININE 0.59 01/22/2014 1605   CALCIUM 9.1 05/17/2021 0508   GFRNONAA >60 05/17/2021 0508   GFRNONAA >89 01/22/2014 1605   GFRAA >60 09/22/2017 0923   GFRAA >89 01/22/2014 1605     Intake/Output Summary (Last 24 hours) at 05/17/2021 0750 Last data filed at 05/17/2021 0518 Gross per 24 hour  Intake 1650 ml  Output 3720 ml  Net -2070 ml    HOSPITAL MEDICATIONS Scheduled  Meds:  aspirin EC  81 mg Oral Daily   atorvastatin  40 mg Oral Daily   clopidogrel  75 mg Oral Daily   docusate sodium  100 mg Oral Daily   enoxaparin (LOVENOX) injection  40 mg Subcutaneous Q24H   fenofibrate  160 mg Oral Daily   insulin aspart  0-9 Units Subcutaneous TID WC   levothyroxine  112 mcg Oral Daily   melatonin  5 mg Oral QHS   pantoprazole  40 mg Oral Daily   pregabalin  50 mg Oral TID   Continuous Infusions:  sodium chloride 75 mL/hr at 05/16/21 0649   sodium chloride     magnesium sulfate bolus IVPB     PRN Meds:.sodium chloride, acetaminophen, ALPRAZolam, alum & mag hydroxide-simeth, guaiFENesin-dextromethorphan, hydrALAZINE, labetalol, magnesium sulfate bolus IVPB, metoprolol tartrate, ondansetron, phenol, potassium chloride, senna-docusate, traMADol, witch hazel-glycerin  Assessment and Plan: POD 1 right TCAR for symptomatic right carotid stenosis. Neuro intact. Hemodynamically stable.  Continue Plavix, aspirin, statin.  Follow-up in [facility] office in 3-4 weeks with carotid duplex  -DVT prophylaxis:  Lovenox   Wendi Maya, PA-C [facility] [phone] 05/17/2021  7:50 AM   I agree with the above.  I seen and evaluated the patient.  She is postoperative day #1 status post right-sided TCAR for symptomatic stenosis.  She has recovered without issues from her operation.  Neurologically, she is at her baseline.  Her incisions are healing appropriately.  From a vascular standpoint, she can be discharged home.  She will need to be on dual antiplatelet therapy with aspirin and Plavix as well as statin therapy.  I will make a determination whether or not we can stop Plavix or just continue it.  Durene Cal

## 2021-05-17 NOTE — Discharge Summary (Addendum)
Physician Discharge Summary  Tiffany Navarro XBM:[id number] DOB: 02/11/1956 DOA: 05/13/2021  PCP: Marletta Lor, NP  Admit date: 05/13/2021 Discharge date: 05/17/2021  Discharge disposition: Home   Recommendations for Outpatient Follow-Up:   Follow-up with PCP in 1 week. Follow-up with vascular surgeon, in 3 to 4 weeks. Follow-up with neurologist in 1 month.   Discharge Diagnosis:   Principal Problem:   Acute embolic stroke [facility]) Active Problems:   Carotid stenosis, right    Discharge Condition: Stable.  Diet recommendation:  Diet Order             Diet heart healthy/carb modified Room service appropriate? Yes; Fluid consistency: Thin  Diet effective now           Diet - low sodium heart healthy                     Code Status: Full Code     Hospital Course:   Tiffany Navarro is a 65 y.o. female with medical history significant of type 1 diabetes, hypertension, hyperlipidemia, osteoarthritis, hypothyroidism, who presented to the hospital with left-sided weakness, generalized weakness, aphasia and unsteady gait.   She was found to have acute embolic stroke involving the right frontal, parietal and occipital areas.  She also has mild petechiae in the region and right parieto-occipital region and 75% right ICA stenosis.  She was out of the window for tPA.   She was treated with aspirin, Plavix and Lipitor.  She was given IV fluids for hydration.  She underwent right carotid stent placement on 05/16/2021. Outpatient PT was recommended.  Her condition has improved and she is deemed stable for discharge to home today.  Discharge plan was discussed with the patient and her granddaughter at the bedside.   Medical Consultants:   Neurologist Vascular surgeon   Discharge Exam:    Vitals:   05/17/21 0200 05/17/21 0400 05/17/21 0735 05/17/21 1050  BP: 125/64  110/66 110/66  Pulse: 66  64 64  Resp: Temp:  98.2 F (36.8 C) 98 F (36.7 C) 98 F  (36.7 C)  TempSrc:  Oral Oral   SpO2: 92%  92% 92%  Weight:      Height:         GEN: NAD SKIN: No rash EYES: EOMI ENT: MMM CV: RRR PULM: CTA B ABD: soft, ND, NT, +BS CNS: AAO x 3, non focal EXT: No edema or tenderness   The results of significant diagnostics from this hospitalization (including imaging, microbiology, ancillary and laboratory) are listed below for reference.     Procedures and Diagnostic Studies:   MR BRAIN WO CONTRAST  Result Date: 05/13/2021 CLINICAL DATA:  Stroke, follow-up EXAM: MRI HEAD WITHOUT CONTRAST TECHNIQUE: Multiplanar, multiecho pulse sequences of the brain and surrounding structures were obtained without intravenous contrast. COMPARISON:  Recent CT imaging FINDINGS: Brain: There are cortical/subcortical areas of restricted diffusion in the right frontal, parietal, and occipital lobes. There is some involvement of the perirolandic region. Minor susceptibility in the right parieto-occipital region likely reflecting petechial hemorrhage. Patchy small foci of T2 hyperintensity in the supratentorial white matter nonspecific but may reflect minor chronic microvascular ischemic changes. Ventricles and sulci are normal in size and configuration. There is no intracranial mass or mass effect. There is no hydrocephalus or extra-axial fluid collection. Vascular: Major vessel flow voids at the skull base are preserved. Skull and upper cervical spine: Normal marrow signal is preserved. Sinuses/Orbits: Trace mucosal thickening.  Orbits are unremarkable. Other: Sella is unremarkable.  Mastoid air cells are clear. IMPRESSION: Acute cortical/subcortical infarcts of the right frontal, parietal, and occipital lobes. Minor petechial hemorrhage in the right parieto-occipital region. Minor chronic microvascular ischemic changes. Electronically Signed   By: Guadlupe Spanish M.D.   On: 05/13/2021 18:13   ECHOCARDIOGRAM COMPLETE  Result Date: 05/14/2021    ECHOCARDIOGRAM REPORT    Patient Name:   Tiffany Navarro Date of Exam: 05/14/2021 Medical Rec #:  [id number]         Height:       65.0 in Accession #:    [id number]        Weight:       193.3 lb Date of Birth:  [birth year]          BSA:          1.950 m Patient Age:    64 years          BP:           115/70 mmHg Patient Gender: F                 HR:           78 bpm. Exam Location:  Inpatient Procedure: 2D Echo, Cardiac Doppler and Color Doppler Indications:    R94.31 Abnormal EKG  History:        Patient has no prior history of Echocardiogram examinations.                 Risk Factors:Hypertension. Hypothyroidism.  Sonographer:    Elmarie Shiley Dance Referring Phys: [id number] ELIZABETH G MATHEWS IMPRESSIONS  1. Left ventricular ejection fraction, by estimation, is 60 to 65%. The left ventricle has normal function. The left ventricle has no regional wall motion abnormalities. Left ventricular diastolic parameters were normal.  2. Right ventricular systolic function is normal. The right ventricular size is normal. Tricuspid regurgitation signal is inadequate for assessing PA pressure.  3. The mitral valve is normal in structure. No evidence of mitral valve regurgitation. No evidence of mitral stenosis.  4. The aortic valve is tricuspid. Aortic valve regurgitation is not visualized. No aortic stenosis is present. Comparison(s): No prior Echocardiogram. Conclusion(s)/Recommendation(s): Otherwise normal echocardiogram, with minor abnormalities described in the report. FINDINGS  Left Ventricle: Left ventricular ejection fraction, by estimation, is 60 to 65%. The left ventricle has normal function. The left ventricle has no regional wall motion abnormalities. The left ventricular internal cavity size was normal in size. There is  no left ventricular hypertrophy. Left ventricular diastolic parameters were normal. Right Ventricle: The right ventricular size is normal. No increase in right ventricular wall thickness. Right ventricular systolic function is  normal. Tricuspid regurgitation signal is inadequate for assessing PA pressure. Left Atrium: Left atrial size was normal in size. Right Atrium: Right atrial size was normal in size. Pericardium: There is no evidence of pericardial effusion. Mitral Valve: The mitral valve is normal in structure. No evidence of mitral valve regurgitation. No evidence of mitral valve stenosis. Tricuspid Valve: The tricuspid valve is normal in structure. Tricuspid valve regurgitation is trivial. No evidence of tricuspid stenosis. Aortic Valve: The aortic valve is tricuspid. Aortic valve regurgitation is not visualized. No aortic stenosis is present. Pulmonic Valve: The pulmonic valve was grossly normal. Pulmonic valve regurgitation is mild. No evidence of pulmonic stenosis. Aorta: The aortic root and ascending aorta are structurally normal, with no evidence of dilitation. IAS/Shunts: The atrial septum is grossly normal.  LEFT VENTRICLE PLAX 2D  LVIDd:         4.30 cm  Diastology LVIDs:         2.40 cm  LV e' medial:    7.94 cm/s LV PW:         0.80 cm  LV E/e' medial:  10.2 LV IVS:        0.80 cm  LV e' lateral:   9.03 cm/s LVOT diam:     2.00 cm  LV E/e' lateral: 9.0 LV SV:         58 LV SV Index:   30 LVOT Area:     3.14 cm  RIGHT VENTRICLE            IVC RV Basal diam:  2.30 cm    IVC diam: 2.00 cm RV S prime:     8.59 cm/s TAPSE (M-mode): 1.6 cm LEFT ATRIUM             Index       RIGHT ATRIUM           Index LA diam:        3.60 cm 1.85 cm/m  RA Area:     14.10 cm LA Vol (A2C):   45.8 ml 23.49 ml/m RA Volume:   31.20 ml  16.00 ml/m LA Vol (A4C):   43.0 ml 22.05 ml/m LA Biplane Vol: 44.7 ml 22.92 ml/m  AORTIC VALVE LVOT Vmax:   102.25 cm/s LVOT Vmean:  67.300 cm/s LVOT VTI:    0.186 m  AORTA Ao Root diam: 3.10 cm Ao Asc diam:  2.80 cm MITRAL VALVE MV Area (PHT): 3.21 cm    SHUNTS MV Decel Time: 236 msec    Systemic VTI:  0.19 m MV E velocity: 81.00 cm/s  Systemic Diam: 2.00 cm MV A velocity: 74.40 cm/s MV E/A ratio:  1.09  Riley Lam MD Electronically signed by Riley Lam MD Signature Date/Time: 05/14/2021/11:40:35 AM    Final    CT HEAD CODE STROKE WO CONTRAST  Result Date: 05/13/2021 CLINICAL DATA:  Weakness EXAM: CT ANGIOGRAPHY HEAD AND NECK CT PERFUSION BRAIN TECHNIQUE: Multidetector CT imaging of the head and neck was performed using the standard protocol during bolus administration of intravenous contrast. Multiplanar CT image reconstructions and MIPs were obtained to evaluate the vascular anatomy. Carotid stenosis measurements (when applicable) are obtained utilizing NASCET criteria, using the distal internal carotid diameter as the denominator. Multiphase CT imaging of the brain was performed following IV bolus contrast injection. Subsequent parametric perfusion maps were calculated using RAPID software. CONTRAST:  OMNIPAQUE IOHEXOL 350 MG/ML SOLN COMPARISON:  None. FINDINGS: CT HEAD Brain: There is no acute intracranial hemorrhage or mass effect. Small area of hypoattenuation with loss of gray-white differentiation in the right parietal lobe possibly with some occipital extension. There is no extra-axial fluid collection. Ventricles and sulci are within normal limits in size and configuration. Vascular: No hyperdense vessel. Skull: Unremarkable. Sinuses/Orbits: No acute finding. Other: None. ASPECTS [facility] Stroke Program Early CT Score) - Ganglionic level infarction (caudate, lentiform nuclei, internal capsule, insula, M1-M3 cortex): 7 - Supraganglionic infarction (M4-M6 cortex): 2 Total score (0-10 with 10 being normal): 9 Review of the MIP images confirms the above findings CTA NECK Aortic arch: No significant plaque along the aortic arch. Great vessel origins are patent. Right carotid system: There is mixed but primarily noncalcified plaque at the bifurcation and proximal internal carotid causing 75% stenosis. Left carotid system: Patent. Trace calcified plaque at the ICA origin without  stenosis. Vertebral arteries: Patent. Right vertebral artery is dominant. Minimal calcified plaque along the right V2 segment. No stenosis. Skeleton: Degenerative changes of the included spine. Other neck: Unremarkable. Upper chest: No apical lung mass. Review of the MIP images confirms the above findings CTA HEAD Anterior circulation: Intracranial internal carotid arteries are patent with mild calcified plaque. Proximal anterior and middle cerebral arteries are patent. Posterior circulation: Intracranial vertebral arteries are patent. Left vertebral artery becomes diminutive after PICA origin. Basilar artery is patent. Major cerebellar artery origins are patent. Posterior cerebral arteries are patent. Venous sinuses: Patent as allowed by contrast bolus timing. Review of the MIP images confirms the above findings CT Brain Perfusion Findings: CBF (<30%) Volume: 8mL Perfusion (Tmax>6.0s) volume: 5mL Mismatch Volume: 3mL Infarction Location:Right parieto-occipital lobes corresponding to head CT finding IMPRESSION: Small acute infarction of the right parietooccipital lobes. There is no proximal intracranial vessel occlusion. 75% stenosis at the right ICA origin is noted and this may reflect a distal embolic or MCA/PCA watershed infarct. Perfusion imaging demonstrates 8 mL core infarction and 3 mL of penumbra in the right parieto-occipital lobes corresponding to noncontrast head CT abnormality. These results were called by telephone at the time of interpretation on 05/13/2021 at 3:44 pm to provider [facility] , who verbally acknowledged these results. Electronically Signed   By: Guadlupe Spanish M.D.   On: 05/13/2021 15:57   CT ANGIO HEAD NECK W WO CM W PERF (CODE STROKE)  Result Date: 05/13/2021 CLINICAL DATA:  Weakness EXAM: CT ANGIOGRAPHY HEAD AND NECK CT PERFUSION BRAIN TECHNIQUE: Multidetector CT imaging of the head and neck was performed using the standard protocol during bolus administration of intravenous  contrast. Multiplanar CT image reconstructions and MIPs were obtained to evaluate the vascular anatomy. Carotid stenosis measurements (when applicable) are obtained utilizing NASCET criteria, using the distal internal carotid diameter as the denominator. Multiphase CT imaging of the brain was performed following IV bolus contrast injection. Subsequent parametric perfusion maps were calculated using RAPID software. CONTRAST:  OMNIPAQUE IOHEXOL 350 MG/ML SOLN COMPARISON:  None. FINDINGS: CT HEAD Brain: There is no acute intracranial hemorrhage or mass effect. Small area of hypoattenuation with loss of gray-white differentiation in the right parietal lobe possibly with some occipital extension. There is no extra-axial fluid collection. Ventricles and sulci are within normal limits in size and configuration. Vascular: No hyperdense vessel. Skull: Unremarkable. Sinuses/Orbits: No acute finding. Other: None. ASPECTS [facility] Stroke Program Early CT Score) - Ganglionic level infarction (caudate, lentiform nuclei, internal capsule, insula, M1-M3 cortex): 7 - Supraganglionic infarction (M4-M6 cortex): 2 Total score (0-10 with 10 being normal): 9 Review of the MIP images confirms the above findings CTA NECK Aortic arch: No significant plaque along the aortic arch. Great vessel origins are patent. Right carotid system: There is mixed but primarily noncalcified plaque at the bifurcation and proximal internal carotid causing 75% stenosis. Left carotid system: Patent. Trace calcified plaque at the ICA origin without stenosis. Vertebral arteries: Patent. Right vertebral artery is dominant. Minimal calcified plaque along the right V2 segment. No stenosis. Skeleton: Degenerative changes of the included spine. Other neck: Unremarkable. Upper chest: No apical lung mass. Review of the MIP images confirms the above findings CTA HEAD Anterior circulation: Intracranial internal carotid arteries are patent with mild calcified plaque.  Proximal anterior and middle cerebral arteries are patent. Posterior circulation: Intracranial vertebral arteries are patent. Left vertebral artery becomes diminutive after PICA origin. Basilar artery is patent. Major cerebellar artery origins are patent. Posterior cerebral  arteries are patent. Venous sinuses: Patent as allowed by contrast bolus timing. Review of the MIP images confirms the above findings CT Brain Perfusion Findings: CBF (<30%) Volume: 8mL Perfusion (Tmax>6.0s) volume: 5mL Mismatch Volume: 3mL Infarction Location:Right parieto-occipital lobes corresponding to head CT finding IMPRESSION: Small acute infarction of the right parietooccipital lobes. There is no proximal intracranial vessel occlusion. 75% stenosis at the right ICA origin is noted and this may reflect a distal embolic or MCA/PCA watershed infarct. Perfusion imaging demonstrates 8 mL core infarction and 3 mL of penumbra in the right parieto-occipital lobes corresponding to noncontrast head CT abnormality. These results were called by telephone at the time of interpretation on 05/13/2021 at 3:44 pm to provider [facility] , who verbally acknowledged these results. Electronically Signed   By: Guadlupe Spanish M.D.   On: 05/13/2021 15:57     Labs:   Basic Metabolic Panel: Recent Labs  Lab 05/13/21 1458 05/14/21 0514 05/14/21 1352 05/15/21 0127 05/16/21 0352 05/17/21 0508  NA 137 136  --  137 137 139  K 4.3 3.4*  --  3.7 4.2 3.7  CL 102 100  --  105 106 106  CO2 24 25  --  23 21* 24  GLUCOSE 162* 99  --  146* 128* 132*  BUN 24* 17  --  CREATININE 1.16* 1.06*  --  0.85 0.76 0.72  CALCIUM 9.4 9.1  --  8.7* 9.1 9.1  MG  --   --  2.1  --   --   --    GFR Estimated Creatinine Clearance: 77.7 mL/min (by C-G formula based on SCr of 0.72 mg/dL). Liver Function Tests: Recent Labs  Lab 05/13/21 1458 05/14/21 0514  AST 23 18  ALT 22 21  ALKPHOS 40 33*  BILITOT 0.4 0.6  PROT 7.9 6.5  ALBUMIN 4.0 3.4*   No  results for input(s): LIPASE, AMYLASE in the last 168 hours. No results for input(s): AMMONIA in the last 168 hours. Coagulation profile Recent Labs  Lab 05/13/21 1458 05/14/21 0514  INR 1.1 1.2    CBC: Recent Labs  Lab 05/13/21 1458 05/13/21 1734 05/14/21 0514 05/15/21 0127 05/16/21 0352 05/17/21 0508  WBC 21.9* 19.4* 13.2* 8.0 7.2 10.5  NEUTROABS 20.2*  --   --  5.0  --   --   HGB 13.7 12.5 12.4 12.2 12.3 11.9*  HCT 41.2 38.9 38.5 37.6 38.3 36.0  MCV 89.0 89.2 88.9 88.9 91.0 87.4  PLT 378 336 308 321 334 353   Cardiac Enzymes: No results for input(s): CKTOTAL, CKMB, CKMBINDEX, TROPONINI in the last 168 hours. BNP: Invalid input(s): POCBNP CBG: Recent Labs  Lab 05/16/21 0928 05/16/21 1324 05/16/21 1605 05/16/21 2124 05/17/21 0614  GLUCAP 123* 140* 187* 174* 142*   D-Dimer No results for input(s): DDIMER in the last 72 hours. Hgb A1c Recent Labs    05/15/21 0127  HGBA1C 7.3*   Lipid Profile Recent Labs    05/17/21 0508  CHOL 110  HDL 36*  LDLCALC 42  TRIG 161*  CHOLHDL 3.1   Thyroid function studies No results for input(s): TSH, T4TOTAL, T3FREE, THYROIDAB in the last 72 hours.  Invalid input(s): FREET3 Anemia work up No results for input(s): VITAMINB12, FOLATE, FERRITIN, TIBC, IRON, RETICCTPCT in the last 72 hours. Microbiology Recent Results (from the past 240 hour(s))  Resp Panel by RT-PCR (Flu A&B, Covid) Nasopharyngeal Swab     Status: None   Collection Time: 05/13/21  3:51 PM  Specimen: Nasopharyngeal Swab; Nasopharyngeal(NP) swabs in vial transport medium  Result Value Ref Range Status   SARS Coronavirus 2 by RT PCR NEGATIVE NEGATIVE Final    Comment: (NOTE) SARS-CoV-2 target nucleic acids are NOT DETECTED.  The SARS-CoV-2 RNA is generally detectable in upper respiratory specimens during the acute phase of infection. The lowest concentration of SARS-CoV-2 viral copies this assay can detect is 138 copies/mL. A negative result does not  preclude SARS-Cov-2 infection and should not be used as the sole basis for treatment or other patient management decisions. A negative result may occur with  improper specimen collection/handling, submission of specimen other than nasopharyngeal swab, presence of viral mutation(s) within the areas targeted by this assay, and inadequate number of viral copies(<138 copies/mL). A negative result must be combined with clinical observations, patient history, and epidemiological information. The expected result is Negative.  Fact Sheet for Patients:  BloggerCourse.comhttps://www.fda.gov/media/152166/download  Fact Sheet for Healthcare Providers:  SeriousBroker.ithttps://www.fda.gov/media/152162/download  This test is no t yet approved or cleared by the [place] FDA and  has been authorized for detection and/or diagnosis of SARS-CoV-2 by FDA under an Emergency Use Authorization (EUA). This EUA will remain  in effect (meaning this test can be used) for the duration of the COVID-19 declaration under Section 564(b)(1) of the Act, 21 U.S.C.section 360bbb-3(b)(1), unless the authorization is terminated  or revoked sooner.       Influenza A by PCR NEGATIVE NEGATIVE Final   Influenza B by PCR NEGATIVE NEGATIVE Final    Comment: (NOTE) The Xpert Xpress SARS-CoV-2/FLU/RSV plus assay is intended as an aid in the diagnosis of influenza from Nasopharyngeal swab specimens and should not be used as a sole basis for treatment. Nasal washings and aspirates are unacceptable for Xpert Xpress SARS-CoV-2/FLU/RSV testing.  Fact Sheet for Patients: BloggerCourse.comhttps://www.fda.gov/media/152166/download  Fact Sheet for Healthcare Providers: SeriousBroker.ithttps://www.fda.gov/media/152162/download  This test is not yet approved or cleared by the [place] FDA and has been authorized for detection and/or diagnosis of SARS-CoV-2 by FDA under an Emergency Use Authorization (EUA). This EUA will remain in effect (meaning this test can be used) for the  duration of the COVID-19 declaration under Section 564(b)(1) of the Act, 21 U.S.C. section 360bbb-3(b)(1), unless the authorization is terminated or revoked.  Performed at [facility], [address] [place], [place] [address]   Culture, blood (routine x 2)     Status: None (Preliminary result)   Collection Time: 05/13/21  4:11 PM   Specimen: BLOOD  Result Value Ref Range Status   Specimen Description   Final    BLOOD RIGHT ANTECUBITAL Performed at [facility], [address] [place], [place] [address]    Special Requests   Final    BOTTLES DRAWN AEROBIC AND ANAEROBIC Blood Culture adequate volume Performed at [facility], [address] [place], [place] [address]    Culture   Final    NO GROWTH 3 DAYS Performed at [facility], [address] [place], [place] [address]    Report Status PENDING  Incomplete  Culture, blood (routine x 2)     Status: None (Preliminary result)   Collection Time: 05/13/21  4:16 PM   Specimen: BLOOD  Result Value Ref Range Status   Specimen Description   Final    BLOOD LEFT ANTECUBITAL Performed at [facility], [address] [place], [place] [address]    Special Requests   Final    BOTTLES DRAWN AEROBIC ONLY  Blood Culture adequate volume Performed at [facility], [address], [place], [place] [address]    Culture   Final    NO GROWTH 3 DAYS Performed at [facility], [address], [place], [place] [address]    Report Status PENDING  Incomplete     Discharge Instructions:   Discharge Instructions     Ambulatory referral to Neurology   Complete by: As directed    Follow up with stroke clinic NP (Jessica Vanschaick or Darrol Angel, if both not available, consider Manson Allan, or Ahern) at [facility] in about 4 weeks. Thanks.   Ambulatory referral to Occupational Therapy   Complete by: As directed    Ambulatory referral  to Physical Therapy   Complete by: As directed    Ambulatory referral to Physical Therapy   Complete by: As directed    Marchell, Froman [phone] --please call the son   Diet - low sodium heart healthy   Complete by: As directed    Discharge wound care:   Complete by: As directed    Keep incision wound clean and dry   Increase activity slowly   Complete by: As directed       Allergies as of 05/17/2021   No Known Allergies      Medication List     STOP taking these medications    meloxicam 15 MG tablet Commonly known as: MOBIC       TAKE these medications    aspirin 81 MG EC tablet Take 1 tablet (81 mg total) by mouth daily. Swallow whole. Start taking on: May 18, 2021   atorvastatin 40 MG tablet Commonly known as: LIPITOR Take 1 tablet (40 mg total) by mouth daily. Start taking on: May 18, 2021 What changed:  medication strength how much to take when to take this   Basaglar KwikPen 100 UNIT/ML Inject 68 Units into the skin every morning.   clopidogrel 75 MG tablet Commonly known as: PLAVIX Take 1 tablet (75 mg total) by mouth daily. Start taking on: May 18, 2021   fenofibrate 160 MG tablet Take 160 mg by mouth daily.   FreeStyle [employer] Use four times a day as needed DX E11.9, E10.9, Z79.4   FREESTYLE LITE test strip Generic drug: glucose blood Use four times a day as needed DX E11.9, E10.9, Z79.4   Contour Next Test test strip Generic drug: glucose blood 1 each by Other route 3 (three) times daily. TID   FreeStyle Unistick II Lancets Misc Use four times a day as needed DX E11.9, E10.9, Z79.4   hydrochlorothiazide 25 MG tablet Commonly known as: HYDRODIURIL Take 25 mg by mouth daily.   Insulin Pen Needle 32G X 4 MM Misc by Does not apply route.   levothyroxine 112 MCG tablet Commonly known as: SYNTHROID Take 112 mcg by mouth daily. What changed: Another medication with the same name was removed. Continue taking this medication,  and follow the directions you see here.   lisinopril 5 MG tablet Commonly known as: ZESTRIL Take 5 mg by mouth daily.   melatonin 5 MG Tabs Take 5 mg by mouth at bedtime.   pantoprazole 40 MG tablet Commonly known as: PROTONIX Take 40 mg by mouth daily.   pregabalin 50 MG capsule Commonly known as: LYRICA Take 50 mg by mouth 3 (three) times daily.   traMADol 50 MG tablet Commonly known as: ULTRAM Take 50 mg by mouth in the morning, at noon, and at bedtime.  Vitamin D (Ergocalciferol) 1.25 MG (50000 UNIT) Caps capsule Commonly known as: DRISDOL TAKE 1 CAPSULE (50,000 UNITS TOTAL) BY MOUTH 3 (THREE) TIMES A WEEK.   Vitamin D3 1.25 MG (50000 UT) Caps Take 1 capsule by mouth once a week.               Durable Medical Equipment  (From admission, onward)           Start     Ordered   05/14/21 1257  For home use only DME Walker rolling  Once       Question Answer Comment  Walker: With 5 Inch Wheels   Patient needs a walker to treat with the following condition Stroke [facility])      05/14/21 1256              Discharge Care Instructions  (From admission, onward)           Start     Ordered   05/17/21 0000  Discharge wound care:       Comments: Keep incision wound clean and dry   05/17/21 1137            Follow-up Information     [facility] Follow [facility].   Specialty: Rehabilitation Why: The outpatient rehab will contact you for the first appointment Contact information: [address] [id number] mc [place] [address] [phone]        [facility] Follow up.   Why: 3-4 weeks. The office will call the patient with an appointment(sent) Contact information: [address] [place] [address] [phone]        [facility]. Schedule an appointment as soon as possible for a visit in 1 month(s).   Specialty:  Neurology Contact information: [address] [place] [address] [phone]                 Time coordinating discharge: 31 minutes  Signed:  Lurene Shadow  [facility] 05/17/2021, 11:37 AM   Pager on [email or web address]. If 7PM-7AM, please contact night-coverage at [email or web address]

## 2021-05-17 NOTE — Progress Notes (Signed)
Physical Therapy Treatment Patient Details Name: Tiffany Navarro MRN: [id number] DOB: 08-07-1956 Today's Date: 05/17/2021    History of Present Illness 65 y.o. female with medical history significant of type 1 diabetes, hypertension, hyperlipidemia, osteoarthritis, hypothyroidism admitted with complaints of generalized weakness.  Now complains of L sided weakness and L visual field cut.    PT Comments    Pt demonstrates improved knowledge and recall of visual deficits and techniques to improve safety this session. Pt tolerates ambulation for increased distances compared to last session, with and without use of RW, with improved stability when using RW. Pt reports fatiguing after trial of gait without AD. Pt collides with one object upon returning to her room with RW. Pt demonstrates deficits in functional mobility, gait, safety, and vision and will benefit from continued acute PT to improve independence in mobility. SPT recommends outpatient PT following discharge for dynamic gait challenges and balance to improve safety and aid in independence with mobility.    Follow Up Recommendations  Outpatient PT;Supervision for mobility/OOB     Equipment Recommendations  Rolling walker with 5" wheels    Recommendations for Other Services       Precautions / Restrictions Precautions Precautions: Fall Precaution Comments: L hemianopia and inattention Restrictions Weight Bearing Restrictions: No    Mobility  Bed Mobility Overal bed mobility: Modified Independent Bed Mobility: Supine to Sit     Supine to sit: Modified independent (Device/Increase time);HOB elevated          Transfers Overall transfer level: Modified independent Equipment used: None Transfers: Sit to/from Stand Sit to Stand: Modified independent (Device/Increase time)            Ambulation/Gait Ambulation/Gait assistance: Supervision;Min guard Gait Distance (Feet): 1000 Feet (>1000) Assistive device: Rolling  walker (2 wheeled);None Gait Pattern/deviations: Step-through pattern Gait velocity: functional Gait velocity interpretation: >2.62 ft/sec, indicative of community ambulatory General Gait Details: Pt ambulates with and without use of RW this session, with improved stability when using RW. Pt is able to recall and demonstrate scanning to her L, with improved attention to obstacles around her and bumping into one object when returning to her room to end session.   Stairs             Wheelchair Mobility    Modified Rankin (Stroke Patients Only) Modified Rankin (Stroke Patients Only) Pre-Morbid Rankin Score: No symptoms Modified Rankin: Moderate disability     Balance Overall balance assessment: Needs assistance Sitting-balance support: No upper extremity supported;Feet supported Sitting balance-Leahy Scale: Good     Standing balance support: During functional activity Standing balance-Leahy Scale: Fair Standing balance comment: pt tolerates static and dynamic standing without reliance of UE support.                            Cognition Arousal/Alertness: Awake/alert Behavior During Therapy: Impulsive Overall Cognitive Status: Impaired/Different from baseline Area of Impairment: Awareness;Safety/judgement                         Safety/Judgement: Decreased awareness of safety Awareness: Emergent   General Comments: Pt demonstrates improvement in awareness of L side. Pt requires verbal cue to recognize RW is backwards when initally attempting to ambulate.      Exercises      General Comments General comments (skin integrity, edema, etc.): Pt reports spilling her oatmeal after losing her spoon in the bed this morning, having some difficulty locating spoon.  Pertinent Vitals/Pain Pain Assessment: No/denies pain    Home Living                      Prior Function            PT Goals (current goals can now be found in the care  plan section) Acute Rehab PT Goals Patient Stated Goal: go home Progress towards PT goals: Progressing toward goals    Frequency    Min 4X/week      PT Plan Current plan remains appropriate    Co-evaluation              AM-PAC PT "6 Clicks" Mobility   Outcome Measure  Help needed turning from your back to your side while in a flat bed without using bedrails?: None Help needed moving from lying on your back to sitting on the side of a flat bed without using bedrails?: None Help needed moving to and from a bed to a chair (including a wheelchair)?: A Little Help needed standing up from a chair using your arms (e.g., wheelchair or bedside chair)?: A Little Help needed to walk in hospital room?: A Little Help needed climbing 3-5 steps with a railing? : A Little 6 Click Score: 20    End of Session Equipment Utilized During Treatment: Gait belt Activity Tolerance: Patient tolerated treatment well Patient left: in chair;with call bell/phone within reach;with family/visitor present Nurse Communication: Mobility status PT Visit Diagnosis: Other abnormalities of gait and mobility (R26.89);Other symptoms and signs involving the nervous system (R29.898)     Time: 2409-7353 PT Time Calculation (min) (ACUTE ONLY): 21 min  Charges:  $Gait Training: 8-22 mins                     Acute Rehab  Pager: [phone]    Waldemar Dickens, SPT  05/17/2021, 1:04 PM

## 2021-05-18 LAB — CULTURE, BLOOD (ROUTINE X 2)
Culture: NO GROWTH
Culture: NO GROWTH
Special Requests: ADEQUATE
Special Requests: ADEQUATE

## 2021-05-19 ENCOUNTER — Encounter (HOSPITAL_COMMUNITY): Payer: Self-pay | Admitting: Surgery

## 2021-05-20 ENCOUNTER — Ambulatory Visit: Payer: Medicare HMO | Attending: Adult Health | Admitting: Physical Therapy

## 2021-05-20 ENCOUNTER — Other Ambulatory Visit: Payer: Self-pay

## 2021-05-20 ENCOUNTER — Ambulatory Visit: Payer: Medicare HMO | Admitting: Occupational Therapy

## 2021-05-20 DIAGNOSIS — R41844 Frontal lobe and executive function deficit: Secondary | ICD-10-CM | POA: Diagnosis not present

## 2021-05-20 DIAGNOSIS — R41842 Visuospatial deficit: Secondary | ICD-10-CM

## 2021-05-20 DIAGNOSIS — R414 Neurologic neglect syndrome: Secondary | ICD-10-CM | POA: Diagnosis not present

## 2021-05-20 DIAGNOSIS — R2681 Unsteadiness on feet: Secondary | ICD-10-CM | POA: Insufficient documentation

## 2021-05-20 DIAGNOSIS — R2689 Other abnormalities of gait and mobility: Secondary | ICD-10-CM

## 2021-05-20 DIAGNOSIS — R278 Other lack of coordination: Secondary | ICD-10-CM | POA: Diagnosis not present

## 2021-05-20 DIAGNOSIS — M6281 Muscle weakness (generalized): Secondary | ICD-10-CM

## 2021-05-20 DIAGNOSIS — R41840 Attention and concentration deficit: Secondary | ICD-10-CM | POA: Insufficient documentation

## 2021-05-20 NOTE — Addendum Note (Signed)
Addended by: Alease Medina L on: 05/20/2021 04:48 PM   Modules accepted: Orders

## 2021-05-20 NOTE — Therapy (Signed)
[facility] [address] [place], [place], [address] Phone: [phone]   Fax:  [phone]  Physical Therapy Evaluation  Patient Details  Name: Tiffany Navarro MRN: [id number] Date of Birth: 07/26/1956 No data recorded  Encounter Date: 05/20/2021   PT End of Session - 05/20/21 1523     Visit Number 1    Number of Visits 8    Date for PT Re-Evaluation 07/15/21    Authorization Type [employer]    PT Start Time 1530    PT Stop Time 1615    PT Time Calculation (min) 45 min    Equipment Utilized During Treatment Gait belt    Activity Tolerance Patient tolerated treatment well    Behavior During Therapy [facility] for tasks assessed/performed             Past Medical History:  Diagnosis Date   Hypertension    Hypothyroidism    Osteoarthritis    Thyroid disease     Past Surgical History:  Procedure Laterality Date   BACK SURGERY  82   HIP PINNING Left 2012   x3   pin removed last surgery   JOINT REPLACEMENT     TOTAL KNEE ARTHROPLASTY Left 02/17/2016   Procedure: TOTAL KNEE ARTHROPLASTY;  Surgeon: Dannielle HuhSteve Lucey, MD;  Location: [facility] OR;  Service: Orthopedics;  Laterality: Left;   TOTAL KNEE ARTHROPLASTY Right 05/11/2016   Procedure: TOTAL KNEE ARTHROPLASTY;  Surgeon: Dannielle HuhSteve Lucey, MD;  Location: [facility] OR;  Service: Orthopedics;  Laterality: Right;   TRANSCAROTID ARTERY REVASCULARIZATION  Right 05/16/2021   Procedure: RIGHT TRANSCAROTID ARTERY REVASCULARIZATION;  Surgeon: Nada LibmanBrabham, Vance W, MD;  Location: [facility] OR;  Service: Vascular;  Laterality: Right;   ULTRASOUND GUIDANCE FOR VASCULAR ACCESS Left 05/16/2021   Procedure: ULTRASOUND GUIDANCE FOR VASCULAR ACCESS, left femoral vein;  Surgeon: Nada LibmanBrabham, Vance W, MD;  Location: [facility] OR;  Service: Vascular;  Laterality: Left;    There were no vitals filed for this visit.    Subjective Assessment - 05/20/21 1533     Subjective Pt states she had her CVA 05/13/21. Went to the  hospital and got some PT. Biggest deficit is her vision, confusion, headaches (was getting this prior to CVA and believes this may be from arthritis in her neck), "A little issue" with walking (feels that left side is going a little faster). Feels that she is going to fall forward.    Pertinent History PMH significant for of type 1 diabetes, hypertension, hyperlipidemia, osteoarthritis, hypothyroidism.    Limitations Standing;Walking    How long can you sit comfortably? n/a    How long can you walk comfortably? In house only; Son worried about pt walking outside    Patient Stated Goals Cooking for family and groceries    Currently in Pain? Yes    Pain Score 4     Pain Location Back    Pain Orientation Right    Pain Descriptors / Indicators Sore    Pain Type Chronic pain    Pain Radiating Towards R foot    Aggravating Factors  Prolonged sitting    Pain Relieving Factors Repositioning                [facility] PT Assessment - 05/20/21 1538       Assessment   Medical Diagnosis CVA    Hand Dominance Right    Prior Therapy Sciatica ~5 years ago      Precautions   Precautions Fall  Precaution Comments visual deficits      Restrictions   Weight Bearing Restrictions No      Balance Screen   Has the patient fallen in the past 6 months No      Home Environment   Living Environment Private residence    Living Arrangements Children    Available Help at Discharge Family    Home Access Level entry    Home Layout One level    Home Equipment None;Walker - 2 wheels;Cane - single point;Shower seat      Prior Function   Level of Independence Independent    Vocation Retired    Leisure Walking ~1 mile 3x/wk      Observation/Other Assessments   Observations Chronic sciatic pain in R LE    Focus on Therapeutic Outcomes (FOTO)  **      Sensation   Additional Comments Reports pins/needles in toes for both feet      ROM / Strength   AROM / PROM / Strength AROM;Strength       Strength   Strength Assessment Site Hip;Knee    Right/Left Hip Right;Left    Right Hip Flexion 4/5    Right Hip ABduction 3+/5    Left Hip Flexion 3+/5    Left Hip ABduction 3+/5    Right/Left Knee Right;Left    Right Knee Flexion 3+/5    Right Knee Extension 5/5    Left Knee Flexion 4+/5    Left Knee Extension 5/5      Transfers   Five time sit to stand comments  18 sec      Ambulation/Gait   Ambulation/Gait Yes    Ambulation/Gait Assistance 4: Min guard    Ambulation Distance (Feet) 500 Feet    Assistive device None    Gait Pattern Step-through pattern;Lateral trunk lean to right;Poor foot clearance - left;Left foot flat;Decreased stride length;Decreased weight shift to right    Ambulation Surface Level;Indoor    Gait velocity 1 m/s      Standardized Balance Assessment   Standardized Balance Assessment Berg Balance Test      Berg Balance Test   Sit to Stand Able to stand without using hands and stabilize independently    Standing Unsupported Able to stand safely 2 minutes    Sitting with Back Unsupported but Feet Supported on Floor or Stool Able to sit safely and securely 2 minutes    Stand to Sit Sits safely with minimal use of hands    Transfers Able to transfer safely, minor use of hands    Standing Unsupported with Eyes Closed Able to stand 10 seconds with supervision    Standing Unsupported with Feet Together Able to place feet together independently and stand for 1 minute with supervision    From Standing, Reach Forward with Outstretched Arm Can reach confidently >25 cm (10")    From Standing Position, Pick up Object from Floor Able to pick up shoe, needs supervision   with L UE   From Standing Position, Turn to Look Behind Over each Shoulder Looks behind one side only/other side shows less weight shift    Turn 360 Degrees Able to turn 360 degrees safely in 4 seconds or less    Standing Unsupported, Alternately Place Feet on Step/Stool Able to complete 4 steps without  aid or supervision    Standing Unsupported, One Foot in Front Able to plae foot ahead of the other independently and hold 30 seconds    Standing on One Leg Able  to lift leg independently and hold 5-10 seconds    Total Score 48    Berg comment: 48/56      Functional Gait  Assessment   Gait assessed  Yes    Gait Level Surface Walks 20 ft in less than 5.5 sec, no assistive devices, good speed, no evidence for imbalance, normal gait pattern, deviates no more than 6 in outside of the 12 in walkway width.    Change in Gait Speed Able to smoothly change walking speed without loss of balance or gait deviation. Deviate no more than 6 in outside of the 12 in walkway width.    Gait with Horizontal Head Turns Performs head turns smoothly with slight change in gait velocity (eg, minor disruption to smooth gait path), deviates 6-10 in outside 12 in walkway width, or uses an assistive device.    Gait with Vertical Head Turns Performs task with slight change in gait velocity (eg, minor disruption to smooth gait path), deviates 6 - 10 in outside 12 in walkway width or uses assistive device    Gait and Pivot Turn Pivot turns safely within 3 sec and stops quickly with no loss of balance.    Step Over Obstacle Cannot perform without assistance.   tripped   Gait with Narrow Base of Support Ambulates less than 4 steps heel to toe or cannot perform without assistance.    Gait with Eyes Closed Walks 20 ft, uses assistive device, slower speed, mild gait deviations, deviates 6-10 in outside 12 in walkway width. Ambulates 20 ft in less than 9 sec but greater than 7 sec.    Ambulating Backwards Walks 20 ft, uses assistive device, slower speed, mild gait deviations, deviates 6-10 in outside 12 in walkway width.    Steps Alternating feet, must use rail.    Total Score 19    FGA comment: <22/30 indicates high fall risk in community dwelling adult                        Objective measurements completed on  examination: See above findings.                 PT Short Term Goals - 05/20/21 1628       PT SHORT TERM GOAL #1   Title Pt will be independent with initial HEP    Time 4    Period Weeks    Status New    Target Date 06/17/21      PT SHORT TERM GOAL #2   Title Pt will be able to perform 5x STS in <13 sec to demo improved functional LE strength    Baseline 18 sec    Time 4    Period Weeks    Status New    Target Date 06/17/21      PT SHORT TERM GOAL #3   Title Pt will be able to amb at least 1000' mod I on level and unlevel surfaces for improved community amb    Baseline Only walking in the house    Time 4    Period Weeks    Status New    Target Date 06/17/21               PT Long Term Goals - 05/20/21 1630       PT LONG TERM GOAL #1   Title Pt will be independent with progressing her own strength and balance at home    Time 8  Period Weeks    Status New    Target Date 07/15/21      PT LONG TERM GOAL #2   Title Pt will have improved Berg Balance Score to at least 52/56 to demo MDIC for decreasing fall risk    Baseline 48/56    Time 8    Period Weeks    Status New    Target Date 07/15/21      PT LONG TERM GOAL #3   Title Pt will have improved FGA to at least 22/30 for decreased risk of falls    Baseline 19/30    Time 8    Period Weeks    Status New    Target Date 07/15/21                    Plan - 05/20/21 1620     Clinical Impression Statement Tiffany Navarro is a 65 y/o F presenting to OPPT s/p R frontal, parietal, and occipital infarcts on 05/13/21. PMH significant for type 1 diabetes (with neuropathy), hypertension, hyperlipidemia, osteoarthritis, hypothyroidism, chronic R side sciatica (R LE typically weaker than L LE prior to CVA), and bilat TKAs (L with decreased ROM than R at baseline). On assessment, pt found to have gross bilat LE weakness (partially chronic) and decreased dynamic balance (DGI indicates high fall  risk). Pt would benefit from PT to optimize her level of function and reach her goals to be able to cook for her family and return to safe walking.    Personal Factors and Comorbidities Age;Comorbidity 1;Comorbidity 2;Comorbidity 3+;Finances    Comorbidities type 1 diabetes, hypertension, hyperlipidemia, osteoarthritis, hypothyroidism, R side sciatica, bilat TKA    Examination-Activity Limitations Locomotion Level;Transfers;Stairs;Bed Mobility    Examination-Participation Restrictions Cleaning;Community Activity;Shop;Yard Work;Laundry;Meal Prep    Stability/Clinical Decision Making Evolving/Moderate complexity    Clinical Decision Making Moderate    Rehab Potential Good    PT Frequency 1x / week    PT Duration 8 weeks    PT Treatment/Interventions ADLs/Self Care Home Management;Gait training;Stair training;Functional mobility training;Moist Heat;Iontophoresis 4mg /ml Dexamethasone;Therapeutic activities;Therapeutic exercise;Balance training;Neuromuscular re-education;Manual techniques;Patient/family education;Passive range of motion;Dry needling;Taping    PT Next Visit Plan Initiate strength and balance HEP. Work on dynamic gait and higher level balance activities.    Recommended Other Services Discussed speech therapy consult    Consulted and Agree with Plan of Care Patient             Patient will benefit from skilled therapeutic intervention in order to improve the following deficits and impairments:  Abnormal gait, Difficulty walking, Decreased activity tolerance, Pain, Decreased balance, Decreased mobility, Decreased strength, Impaired sensation  Visit Diagnosis: Unsteadiness on feet  Other abnormalities of gait and mobility  Muscle weakness (generalized)     Problem List Patient Active Problem List   Diagnosis Date Noted   Carotid stenosis, right 05/14/2021   Acute embolic stroke (HCC) 05/13/2021   Diabetes (HCC) 03/23/2019   Numbness 02/16/2018   Parathyroid disease  (HCC) 12/23/2016   S/P total knee replacement 02/17/2016   Chronic pain of both knees 01/09/2016   Hypertriglyceridemia 05/30/2014   Hypothyroidism 01/22/2014   Essential hypertension, benign 01/22/2014   Osteoarthritis of left knee 01/22/2014    [facility] April Ma L Dhruvi Crenshaw PT, DPT 05/20/2021, 4:36 PM  [place] [facility] [address] [place], [place], [place] Phone: [phone]   Fax:  [phone]  Name: DENINE BROTZ MRN: Tiffany Fleeting Date of Birth: 1956/03/07

## 2021-05-21 ENCOUNTER — Encounter: Payer: Self-pay | Admitting: Occupational Therapy

## 2021-05-21 NOTE — Therapy (Signed)
[facility] [address] [place], [place], [address] Phone: [phone]   Fax:  [phone]  Occupational Therapy Evaluation  Patient Details  Name: Tiffany Navarro MRN: [id number] Date of Birth: 1956/01/03 Referring Provider (OT): Judd Lien, NP (pt referred by hospitalist will send to PCP)   Encounter Date: 05/20/2021   OT End of Session - 05/20/21 1511     Visit Number 1    Number of Visits 24    Date for OT Re-Evaluation 08/12/21    Authorization Type [employer]    Authorization - Visit Number 1    Authorization - Number of Visits 10    Progress Note Due on Visit 10    OT Start Time 1450    OT Stop Time 1525    OT Time Calculation (min) 35 min             Past Medical History:  Diagnosis Date   Hypertension    Hypothyroidism    Osteoarthritis    Thyroid disease     Past Surgical History:  Procedure Laterality Date   BACK SURGERY  82   HIP PINNING Left 2012   x3   pin removed last surgery   JOINT REPLACEMENT     TOTAL KNEE ARTHROPLASTY Left 02/17/2016   Procedure: TOTAL KNEE ARTHROPLASTY;  Surgeon: Dannielle Huh, MD;  [facility]: [facility];  Service: Orthopedics;  Laterality: Left;   TOTAL KNEE ARTHROPLASTY Right 05/11/2016   Procedure: TOTAL KNEE ARTHROPLASTY;  Surgeon: Dannielle Huh, MD;  Location: [facility] OR;  Service: Orthopedics;  Laterality: Right;   TRANSCAROTID ARTERY REVASCULARIZATION  Right 05/16/2021   Procedure: RIGHT TRANSCAROTID ARTERY REVASCULARIZATION;  Surgeon: Nada Libman, MD;  Location: [facility] OR;  Service: Vascular;  Laterality: Right;   ULTRASOUND GUIDANCE FOR VASCULAR ACCESS Left 05/16/2021   Procedure: ULTRASOUND GUIDANCE FOR VASCULAR ACCESS, left femoral vein;  Surgeon: Nada Libman, MD;  [facility] OR;  Service: Vascular;  Laterality: Left;    There were no vitals filed for this visit.   Subjective Assessment - 05/20/21 1455     Subjective  Pt reports she wants to be able to  make a pot of meatballs    Patient Stated Goals to be normal, make a pot of meatballs    Currently in Pain? Yes    Pain Score 3     Pain Location Back    Pain Orientation Right    Pain Descriptors / Indicators Sore    Pain Type Surgical pain    Pain Onset More than a month ago    Pain Frequency Intermittent    Aggravating Factors  laying in bed    Pain Relieving Factors repositioning               [facility] OT Assessment - 05/21/21 0001       Assessment   Medical Diagnosis CVA    Referring Provider (OT) Judd Lien, NP   pt referred by hospitalist will send to PCP   Onset Date/Surgical Date 05/14/21    Hand Dominance Right    Prior Therapy Sciatica ~5 years ago      Precautions   Precautions Fall    Precaution Comments visual deficits, L inattention      Restrictions   Weight Bearing Restrictions No      Balance Screen   Has the patient fallen in the past 6 months No    Has the patient had a decrease in activity level because  of a fear of falling?  No    Is the patient reluctant to leave their home because of a fear of falling?  No      Home  Environment   Family/patient expects to be discharged to: Private residence   staying with son   Lives With Family;Son   currently staying with her son     Prior Function   Level of Independence Independent    Vocation Retired    Leisure Walking ~1 mile 3x/wk      ADL   Eating/Feeding Modified independent    Grooming Modified independent    Upper Body Bathing Minimal assistance    Lower Body Bathing Minimal assistance    Clinical cytogeneticist Minimal assistance      IADL   Light Housekeeping Needs help with all home maintenance tasks    Meal Prep Able to complete simple cold meal and snack prep   reports cooking with supervision   Medication Management Has difficulty remembering to take medication    family assists   Financial Management Dependent      Mobility   Mobility Status --   supervision     Written Expression   Dominant Hand Right      Vision Assessment   Vision Assessment Vision impaired  _ to be further tested in functional context    Visual Fields Left visual field deficit   visual deficit left inferior quadrant   Comment Number cacellation, 51M, pt missed all items left of midline, left inattention      Cognition   Overall Cognitive Status Impaired/Different from baseline    Area of Impairment Safety/judgement;Awareness;Problem solving    Safety/Judgement Decreased awareness of safety;Decreased awareness of deficits    Problem Solving Difficulty sequencing    Attention Selective    Awareness Impaired    Awareness Impairment --   not aware of left inattention   Behaviors Impulsive      Sensation   Additional Comments Reports pins/needles in toes for both feet      Coordination   Fine Motor Movements are Fluid and Coordinated No    9 Hole Peg Test Right;Left    Right 9 Hole Peg Test 30.22    Left 9 Hole Peg Test 47.09    Coordination impaired for LUE      ROM / Strength   AROM / PROM / Strength AROM;Strength      AROM   Overall AROM  Within functional limits for tasks performed   however decreased LUE control     Strength   Overall Strength Deficits    Overall Strength Comments RUE 4-4+/5, LUE 3+/5      Hand Function   Right Hand Grip (lbs) 54.4    Left Hand Grip (lbs) 43.2                               OT Short Term Goals - 05/21/21 1204       OT SHORT TERM GOAL #1   Title I with HEP    Time 4    Period Weeks    Status New      OT SHORT TERM GOAL #2   Title Pt will verbalize understanding of compensatory strategies for visual deficits    Time 4    Period Weeks  Status New      OT SHORT TERM GOAL #3   Title Pt will perform all basic ADLS with distant supervision.    Time 4    Period Weeks    Status New      OT  SHORT TERM GOAL #4   Title Pt will perform tabletop scanning activities with 80% or better accuracy.    Time 4    Period Weeks      OT SHORT TERM GOAL #5   Title Pt will demonstrate ability to retrieve a lightweight object from a midlevel shelf with LUE demonstrating good control.    Time 4      Additional Short Term Goals   Additional Short Term Goals Yes      OT SHORT TERM GOAL #6   Title Pt will perform basic home management/ cooking with min A.    Time 4    Period Weeks    Status New      OT SHORT TERM GOAL #7   Title Pt will demonstrate improved fine motor coordination for ADLs as evidenced by decreasing 9 hole peg test to 44 secs or less.    Time 4    Period Weeks    Status New               OT Long Term Goals - 05/21/21 1235       OT LONG TERM GOAL #1   Title Pt will demonstrate improved fine motor coordination as evideneced by decreasing 9 hole peg test score to 38 secs or less    Time 12    Period Weeks    Status New    Target Date 08/13/21      OT LONG TERM GOAL #2   Title Pt will navigate a busy environment and locate items with 90% or better accuracy.    Time 12    Period Weeks    Status New      OT LONG TERM GOAL #3   Title Pt will perform functional tabletop tasks requiring scanning with 90% or better accuracy    Time 12    Period Weeks    Status New      OT LONG TERM GOAL #4   Title Pt will perform basic home management modified independently    Time 12    Period Weeks    Status New      OT LONG TERM GOAL #5   Title Pt will perform basic cooking demonstrating good safety awareness with distant supervision.    Time 12    Period Weeks    Status New                   Plan - 05/21/21 1043     Clinical Impression Statement Pt is a 65 y/o female presenting on 05/13/2021 with complaints of generalized weakness. CT head reveals parietal occipital infarct. MRI revealing R frontal, pareital, and occipital infarcts. PMH significant for  of type 1 diabetes, hypertension, hyperlipidemia, osteoarthritis, hypothyroidism. Pt presents to occupational therapy with the following deficits: decreased strength, decreased coordination, decreased balance, motor planning difiiculties, visual deficits.Pt can benefit from skilled occupational therapy to address these deficits in order to maximize pt's safety and I with ADLS/ IADLs.    OT Occupational Profile and History Problem Focused Assessment - Including review of records relating to presenting problem    Occupational performance deficits (Please refer to evaluation for details): ADL's;IADL's;Leisure;Social Participation    Body Structure /  Function / Physical Skills ADL;Decreased knowledge of use of DME;Strength;Gait;GMC;Dexterity;Balance;UE functional use;Endurance;IADL;ROM;FMC;Coordination;Flexibility;Mobility;Sensation;Vision    Rehab Potential Good    Clinical Decision Making Limited treatment options, no task modification necessary    Comorbidities Affecting Occupational Performance: May have comorbidities impacting occupational performance    Modification or Assistance to Complete Evaluation  No modification of tasks or assist necessary to complete eval    OT Frequency 2x / week   may d/c after 8 weeks dependent on pt progress.   OT Duration 12 weeks    OT Treatment/Interventions Self-care/ADL training;Moist Heat;Fluidtherapy;DME and/or AE instruction;Balance training;Therapeutic activities;Aquatic Therapy;Ultrasound;Therapeutic exercise;Cognitive remediation/compensation;Visual/perceptual remediation/compensation;Passive range of motion;Functional Mobility Training;Neuromuscular education;Cryotherapy;Electrical Stimulation;Paraffin;Energy conservation;Manual Therapy;Patient/family education    Plan HEP for LUE coordination/ functional use    Consulted and Agree with Plan of Care Patient             Patient will benefit from skilled therapeutic intervention in order to improve the  following deficits and impairments:   Body Structure / Function / Physical Skills: ADL, Decreased knowledge of use of DME, Strength, Gait, GMC, Dexterity, Balance, UE functional use, Endurance, IADL, ROM, FMC, Coordination, Flexibility, Mobility, Sensation, Vision       Visit Diagnosis: Muscle weakness (generalized) - Plan: Ot plan of care cert/re-cert  Other lack of coordination - Plan: Ot plan of care cert/re-cert  Unsteadiness on feet - Plan: Ot plan of care cert/re-cert  Other abnormalities of gait and mobility - Plan: Ot plan of care cert/re-cert  Visuospatial deficit - Plan: Ot plan of care cert/re-cert  Neurologic neglect syndrome - Plan: Ot plan of care cert/re-cert  Attention and concentration deficit - Plan: Ot plan of care cert/re-cert  Frontal lobe and executive function deficit - Plan: Ot plan of care cert/re-cert    Problem List Patient Active Problem List   Diagnosis Date Noted   Carotid stenosis, right 05/14/2021   Acute embolic stroke (HCC) 05/13/2021   Diabetes (HCC) 03/23/2019   Numbness 02/16/2018   Parathyroid disease (HCC) 12/23/2016   S/P total knee replacement 02/17/2016   Chronic pain of both knees 01/09/2016   Hypertriglyceridemia 05/30/2014   Hypothyroidism 01/22/2014   Essential hypertension, benign 01/22/2014   Osteoarthritis of left knee 01/22/2014    Tiffany Navarro 05/21/2021, 1:05 PM Tiffany Navarro, Tiffany Navarro [phone] Phone: [phone] 1:05 PM 05/21/21  [facility] [address] [place], [place], [address] Phone: [phone]   Fax:  [phone]  Name: Tiffany Navarro MRN: [id number] Date of Birth: 08/03/1956

## 2021-05-22 ENCOUNTER — Other Ambulatory Visit: Payer: Self-pay

## 2021-05-22 ENCOUNTER — Ambulatory Visit: Payer: Medicare HMO | Admitting: Occupational Therapy

## 2021-05-22 DIAGNOSIS — R41844 Frontal lobe and executive function deficit: Secondary | ICD-10-CM | POA: Diagnosis not present

## 2021-05-22 DIAGNOSIS — R414 Neurologic neglect syndrome: Secondary | ICD-10-CM | POA: Diagnosis not present

## 2021-05-22 DIAGNOSIS — R41840 Attention and concentration deficit: Secondary | ICD-10-CM | POA: Diagnosis not present

## 2021-05-22 DIAGNOSIS — R41842 Visuospatial deficit: Secondary | ICD-10-CM | POA: Diagnosis not present

## 2021-05-22 DIAGNOSIS — M6281 Muscle weakness (generalized): Secondary | ICD-10-CM | POA: Diagnosis not present

## 2021-05-22 DIAGNOSIS — R2681 Unsteadiness on feet: Secondary | ICD-10-CM | POA: Diagnosis not present

## 2021-05-22 DIAGNOSIS — R2689 Other abnormalities of gait and mobility: Secondary | ICD-10-CM | POA: Diagnosis not present

## 2021-05-22 DIAGNOSIS — R278 Other lack of coordination: Secondary | ICD-10-CM | POA: Diagnosis not present

## 2021-05-22 NOTE — Patient Instructions (Addendum)
Visual compensation strategies:    1. Look for the edge of objects (to the left and/or right) so that you make sure you are seeing all of an object  2. Turn your head when walking, scan from side to side, particularly in busy environments  3. Use an organized scanning pattern. It's usually easier to scan from top to bottom, and left to right (like you are reading)  4. Double check yourself  5. Use a line guide (like a blank piece of paper) or your finger when reading  6. If necessary, place brightly colored tape at end of table or work area as a reminder to always look until you see the tape.      Activities to try at home to encourage visual scanning:   1. Word searches 2. Mazes 3. Puzzles 4. Card games 5. Computer games and/or searches 6. Connect-the-dots  Activities for environmental (larger) scanning:  1. With supervision, scan for items in grocery store or drugstore.  Begin with a familiar store, then progress to a new store you've never been in before. Make sure you have supervision with this.              Coordination Activities  Perform the following activities for 20 minutes 1 times per day with left hand(s).  Rotate ball in fingertips (clockwise and counter-clockwise). Toss ball between hands. Toss ball in air and catch with the same hand. Flip cards 1 at a time as fast as you can. Deal cards with your thumb (Hold deck in hand and push card off top with thumb). Pick up coins and place in container or coin bank. Pick up coins and stack.

## 2021-05-23 NOTE — Therapy (Signed)
[facility] [address] [place], [place], [address] Phone: [phone]   Fax:  [phone]  Occupational Therapy Treatment  Patient Details  Name: Tiffany Navarro MRN: [id number] Date of Birth: 1956/03/26 Referring Provider (OT): Judd Lien, NP (pt referred by hospitalist will send to PCP)   Encounter Date: 05/22/2021   OT End of Session - 05/22/21 1141     Visit Number 1    Number of Visits 24    Date for OT Re-Evaluation 08/12/21    Authorization Type [employer]    Authorization - Visit Number 1    Authorization - Number of Visits 10    Progress Note Due on Visit 10    OT Start Time 1104    OT Stop Time 1144    OT Time Calculation (min) 40 min             Past Medical History:  Diagnosis Date   Hypertension    Hypothyroidism    Osteoarthritis    Thyroid disease     Past Surgical History:  Procedure Laterality Date   BACK SURGERY  82   HIP PINNING Left 2012   x3   pin removed last surgery   JOINT REPLACEMENT     TOTAL KNEE ARTHROPLASTY Left 02/17/2016   Procedure: TOTAL KNEE ARTHROPLASTY;  Surgeon: Dannielle Huh, MD;  [facility]: [facility] OR;  Service: Orthopedics;  Laterality: Left;   TOTAL KNEE ARTHROPLASTY Right 05/11/2016   Procedure: TOTAL KNEE ARTHROPLASTY;  Surgeon: Dannielle Huh, MD;  Location: [facility] OR;  Service: Orthopedics;  Laterality: Right;   TRANSCAROTID ARTERY REVASCULARIZATION  Right 05/16/2021   Procedure: RIGHT TRANSCAROTID ARTERY REVASCULARIZATION;  Surgeon: Nada Libman, MD;  Location: [facility] OR;  Service: Vascular;  Laterality: Right;   ULTRASOUND GUIDANCE FOR VASCULAR ACCESS Left 05/16/2021   Procedure: ULTRASOUND GUIDANCE FOR VASCULAR ACCESS, left femoral vein;  Surgeon: Nada Libman, MD;  [facility]: [facility];  Service: Vascular;  Laterality: Left;    There were no vitals filed for this visit.   Subjective Assessment - 05/22/21 1106     Subjective  Pt reports she is tired today     Patient Stated Goals to be normal, make a pot of meatballs    Currently in Pain? Yes    Pain Score 2     Pain Location Knee    Pain Orientation Left    Pain Descriptors / Indicators Aching    Pain Type Chronic pain    Pain Onset More than a month ago    Pain Frequency Constant    Aggravating Factors  unknown    Pain Relieving Factors unknown                                  OT Education - 05/22/21 1137     Education Details coordination HEP min-mod v.c due to deficits, visual scanning strategies- see pt instructions, activities to encourage visual scanning,    Person(s) Educated Patient    Methods Explanation;Demonstration;Verbal cues;Handout    Comprehension Verbalized understanding;Returned demonstration;Verbal cues required              OT Short Term Goals - 05/21/21 1204       OT SHORT TERM GOAL #1   Title I with HEP    Time 4    Period Weeks    Status New      OT SHORT TERM GOAL #  2   Title Pt will verbalize understanding of compensatory strategies for visual deficits    Time 4    Period Weeks    Status New      OT SHORT TERM GOAL #3   Title Pt will perform all basic ADLS with distant supervision.    Time 4    Period Weeks    Status New      OT SHORT TERM GOAL #4   Title Pt will perform tabletop scanning activities with 80% or better accuracy.    Time 4    Period Weeks      OT SHORT TERM GOAL #5   Title Pt will demonstrate ability to retrieve a lightweight object from a midlevel shelf with LUE demonstrating good control.    Time 4      Additional Short Term Goals   Additional Short Term Goals Yes      OT SHORT TERM GOAL #6   Title Pt will perform basic home management/ cooking with min A.    Time 4    Period Weeks    Status New      OT SHORT TERM GOAL #7   Title Pt will demonstrate improved fine motor coordination for ADLs as evidenced by decreasing 9 hole peg test to 44 secs or less.    Time 4    Period Weeks    Status  New               OT Long Term Goals - 05/21/21 1235       OT LONG TERM GOAL #1   Title Pt will demonstrate improved fine motor coordination as evideneced by decreasing 9 hole peg test score to 38 secs or less    Time 12    Period Weeks    Status New    Target Date 08/13/21      OT LONG TERM GOAL #2   Title Pt will navigate a busy environment and locate items with 90% or better accuracy.    Time 12    Period Weeks    Status New      OT LONG TERM GOAL #3   Title Pt will perform functional tabletop tasks requiring scanning with 90% or better accuracy    Time 12    Period Weeks    Status New      OT LONG TERM GOAL #4   Title Pt will perform basic home management modified independently    Time 12    Period Weeks    Status New      OT LONG TERM GOAL #5   Title Pt will perform basic cooking demonstrating good safety awareness with distant supervision.    Time 12    Period Weeks    Status New                   Plan - 05/23/21 1506     Clinical Impression Statement Pt is progressing towards goals. She demonstrates understanding of HEP.    OT Occupational Profile and History Problem Focused Assessment - Including review of records relating to presenting problem    Occupational performance deficits (Please refer to evaluation for details): ADL's;IADL's;Leisure;Social Participation    Body Structure / Function / Physical Skills ADL;Decreased knowledge of use of DME;Strength;Gait;GMC;Dexterity;Balance;UE functional use;Endurance;IADL;ROM;FMC;Coordination;Flexibility;Mobility;Sensation;Vision    Rehab Potential Good    OT Frequency 2x / week    OT Duration 12 weeks    OT Treatment/Interventions Self-care/ADL training;Moist Heat;Fluidtherapy;DME and/or AE instruction;Balance  training;Therapeutic activities;Aquatic Therapy;Ultrasound;Therapeutic exercise;Cognitive remediation/compensation;Visual/perceptual remediation/compensation;Passive range of motion;Functional  Mobility Training;Neuromuscular education;Cryotherapy;Electrical Stimulation;Paraffin;Energy conservation;Manual Therapy;Patient/family education    Plan continue to address NMR, L neglect    Consulted and Agree with Plan of Care Patient             Patient will benefit from skilled therapeutic intervention in order to improve the following deficits and impairments:   Body Structure / Function / Physical Skills: ADL, Decreased knowledge of use of DME, Strength, Gait, GMC, Dexterity, Balance, UE functional use, Endurance, IADL, ROM, FMC, Coordination, Flexibility, Mobility, Sensation, Vision       Visit Diagnosis: Muscle weakness (generalized)  Other lack of coordination  Visuospatial deficit  Attention and concentration deficit  Frontal lobe and executive function deficit  Neurologic neglect syndrome  Unsteadiness on feet    Problem List Patient Active Problem List   Diagnosis Date Noted   Carotid stenosis, right 05/14/2021   Acute embolic stroke (HCC) 05/13/2021   Diabetes (HCC) 03/23/2019   Numbness 02/16/2018   Parathyroid disease (HCC) 12/23/2016   S/P total knee replacement 02/17/2016   Chronic pain of both knees 01/09/2016   Hypertriglyceridemia 05/30/2014   Hypothyroidism 01/22/2014   Essential hypertension, benign 01/22/2014   Osteoarthritis of left knee 01/22/2014    Champion Corales 05/23/2021, 3:07 PM  [facility] [address] [place], [place], [address] Phone: [phone]   Fax:  [phone]  Name: Tiffany Navarro MRN: [id number] Date of Birth: [birth year]

## 2021-05-26 ENCOUNTER — Other Ambulatory Visit: Payer: Self-pay

## 2021-05-26 ENCOUNTER — Ambulatory Visit: Payer: Medicare HMO | Admitting: Occupational Therapy

## 2021-05-26 DIAGNOSIS — R2681 Unsteadiness on feet: Secondary | ICD-10-CM | POA: Diagnosis not present

## 2021-05-26 DIAGNOSIS — R41842 Visuospatial deficit: Secondary | ICD-10-CM

## 2021-05-26 DIAGNOSIS — R414 Neurologic neglect syndrome: Secondary | ICD-10-CM | POA: Diagnosis not present

## 2021-05-26 DIAGNOSIS — R41840 Attention and concentration deficit: Secondary | ICD-10-CM | POA: Diagnosis not present

## 2021-05-26 DIAGNOSIS — R41844 Frontal lobe and executive function deficit: Secondary | ICD-10-CM

## 2021-05-26 DIAGNOSIS — R278 Other lack of coordination: Secondary | ICD-10-CM

## 2021-05-26 DIAGNOSIS — M6281 Muscle weakness (generalized): Secondary | ICD-10-CM | POA: Diagnosis not present

## 2021-05-26 DIAGNOSIS — R2689 Other abnormalities of gait and mobility: Secondary | ICD-10-CM | POA: Diagnosis not present

## 2021-05-26 NOTE — Therapy (Signed)
[facility] [address] [place], [place], [address] Phone: [phone]   Fax:  [phone]  Occupational Therapy Treatment  Patient Details  Name: Tiffany Navarro MRN: [id number] Date of Birth: 26-Jun-1956 Referring Provider (OT): Judd Lien, NP (pt referred by hospitalist will send to PCP)   Encounter Date: 05/26/2021   OT End of Session - 05/26/21 1225     Visit Number 3    Number of Visits 24    Date for OT Re-Evaluation 08/12/21    Authorization Type [employer]    Authorization - Visit Number 3    Authorization - Number of Visits 10    OT Start Time 1105    OT Stop Time 1145    OT Time Calculation (min) 40 min    Activity Tolerance Patient tolerated treatment well    Behavior During Therapy [facility] for tasks assessed/performed             Past Medical History:  Diagnosis Date   Hypertension    Hypothyroidism    Osteoarthritis    Thyroid disease     Past Surgical History:  Procedure Laterality Date   BACK SURGERY  82   HIP PINNING Left 2012   x3   pin removed last surgery   JOINT REPLACEMENT     TOTAL KNEE ARTHROPLASTY Left 02/17/2016   Procedure: TOTAL KNEE ARTHROPLASTY;  Surgeon: Dannielle Huh, MD;  [facility];  Service: Orthopedics;  Laterality: Left;   TOTAL KNEE ARTHROPLASTY Right 05/11/2016   Procedure: TOTAL KNEE ARTHROPLASTY;  Surgeon: Dannielle Huh, MD;  [facility];  Service: Orthopedics;  Laterality: Right;   TRANSCAROTID ARTERY REVASCULARIZATION  Right 05/16/2021   Procedure: RIGHT TRANSCAROTID ARTERY REVASCULARIZATION;  Surgeon: Nada Libman, MD;  [facility];  Service: Vascular;  Laterality: Right;   ULTRASOUND GUIDANCE FOR VASCULAR ACCESS Left 05/16/2021   Procedure: ULTRASOUND GUIDANCE FOR VASCULAR ACCESS, left femoral vein;  Surgeon: Nada Libman, MD;  [facility];  Service: Vascular;  Laterality: Left;    There were no vitals filed for this visit.    Subjective Assessment - 05/26/21 1224     Subjective  Pt reports it was a difficult weekend    Patient Stated Goals to be normal, make a pot of meatballs    Currently in Pain? Yes    Pain Score 7     Pain Location Shoulder    Pain Orientation Right    Pain Descriptors / Indicators Aching    Pain Type Chronic pain    Pain Onset More than a month ago    Pain Frequency Constant    Aggravating Factors  unknown    Pain Relieving Factors unknown                    Treatment: Number copying task, 34M with 100% accuracy Folding towels with bilateral UE'S for increased bimanual functional use, min difficulty/ v.c Copying small peg design with LUE for fine motor coordination and visual perceptual skills increased time and mod v.c due to L neglect. Pt practiced turning on the water and adjusting temperature as pt reports this is a challenge at home. Pt was instructed to test water temp with RUE for increased safety, min v.c                OT Short Term Goals - 05/21/21 1204       OT SHORT TERM GOAL #1   Title I with  HEP    Time 4    Period Weeks    Status New      OT SHORT TERM GOAL #2   Title Pt will verbalize understanding of compensatory strategies for visual deficits    Time 4    Period Weeks    Status New      OT SHORT TERM GOAL #3   Title Pt will perform all basic ADLS with distant supervision.    Time 4    Period Weeks    Status New      OT SHORT TERM GOAL #4   Title Pt will perform tabletop scanning activities with 80% or better accuracy.    Time 4    Period Weeks      OT SHORT TERM GOAL #5   Title Pt will demonstrate ability to retrieve a lightweight object from a midlevel shelf with LUE demonstrating good control.    Time 4      Additional Short Term Goals   Additional Short Term Goals Yes      OT SHORT TERM GOAL #6   Title Pt will perform basic home management/ cooking with min A.    Time 4    Period Weeks    Status New      OT SHORT  TERM GOAL #7   Title Pt will demonstrate improved fine motor coordination for ADLs as evidenced by decreasing 9 hole peg test to 44 secs or less.    Time 4    Period Weeks    Status New               OT Long Term Goals - 05/21/21 1235       OT LONG TERM GOAL #1   Title Pt will demonstrate improved fine motor coordination as evideneced by decreasing 9 hole peg test score to 38 secs or less    Time 12    Period Weeks    Status New    Target Date 08/13/21      OT LONG TERM GOAL #2   Title Pt will navigate a busy environment and locate items with 90% or better accuracy.    Time 12    Period Weeks    Status New      OT LONG TERM GOAL #3   Title Pt will perform functional tabletop tasks requiring scanning with 90% or better accuracy    Time 12    Period Weeks    Status New      OT LONG TERM GOAL #4   Title Pt will perform basic home management modified independently    Time 12    Period Weeks    Status New      OT LONG TERM GOAL #5   Title Pt will perform basic cooking demonstrating good safety awareness with distant supervision.    Time 12    Period Weeks    Status New                   Plan - 05/26/21 1514     Clinical Impression Statement Pt is progressing towards goals. She demonstrates improving L side awareness and functional use.    OT Occupational Profile and History Problem Focused Assessment - Including review of records relating to presenting problem    Occupational performance deficits (Please refer to evaluation for details): ADL's;IADL's;Leisure;Social Participation    Body Structure / Function / Physical Skills ADL;Decreased knowledge of use of DME;Strength;Gait;GMC;Dexterity;Balance;UE functional use;Endurance;IADL;ROM;FMC;Coordination;Flexibility;Mobility;Sensation;Vision  Rehab Potential Good    OT Frequency 2x / week    OT Duration 12 weeks    OT Treatment/Interventions Self-care/ADL training;Moist Heat;Fluidtherapy;DME and/or AE  instruction;Balance training;Therapeutic activities;Aquatic Therapy;Ultrasound;Therapeutic exercise;Cognitive remediation/compensation;Visual/perceptual remediation/compensation;Passive range of motion;Functional Mobility Training;Neuromuscular education;Cryotherapy;Electrical Stimulation;Paraffin;Energy conservation;Manual Therapy;Patient/family education    Plan continue to address NMR, L neglect    Consulted and Agree with Plan of Care Patient             Patient will benefit from skilled therapeutic intervention in order to improve the following deficits and impairments:   Body Structure / Function / Physical Skills: ADL, Decreased knowledge of use of DME, Strength, Gait, GMC, Dexterity, Balance, UE functional use, Endurance, IADL, ROM, FMC, Coordination, Flexibility, Mobility, Sensation, Vision       Visit Diagnosis: Muscle weakness (generalized)  Other lack of coordination  Visuospatial deficit  Attention and concentration deficit  Frontal lobe and executive function deficit  Neurologic neglect syndrome    Problem List Patient Active Problem List   Diagnosis Date Noted   Carotid stenosis, right 05/14/2021   Acute embolic stroke (HCC) 05/13/2021   Diabetes (HCC) 03/23/2019   Numbness 02/16/2018   Parathyroid disease (HCC) 12/23/2016   S/P total knee replacement 02/17/2016   Chronic pain of both knees 01/09/2016   Hypertriglyceridemia 05/30/2014   Hypothyroidism 01/22/2014   Essential hypertension, benign 01/22/2014   Osteoarthritis of left knee 01/22/2014    Twanda Stakes 05/26/2021, 3:15 PM  [facility] [address] [place], [place], [address] Phone: [phone]   Fax:  [phone]  Name: MEI SUITS MRN: [id number] Date of Birth: 14-Jul-1956

## 2021-05-27 ENCOUNTER — Ambulatory Visit: Payer: Medicare HMO | Admitting: Occupational Therapy

## 2021-05-28 ENCOUNTER — Other Ambulatory Visit: Payer: Self-pay | Admitting: *Deleted

## 2021-05-28 ENCOUNTER — Telehealth: Payer: Self-pay | Admitting: Occupational Therapy

## 2021-05-28 DIAGNOSIS — I6521 Occlusion and stenosis of right carotid artery: Secondary | ICD-10-CM

## 2021-05-28 NOTE — Telephone Encounter (Signed)
Ihor Austin, NP Loreli Slot is being seen for PT, OT, s/p CVA. She sees you for stroke f/u. She can benefit from ST eval for cognition. If you agree, please place this referral. Thanks, Keene Breath, OTR/L

## 2021-05-29 NOTE — Telephone Encounter (Signed)
Unfortunately, I am not able to place order until I see her which is not until 8/1.  She should have already been seen by her PCP who can place this order.  Sorry I cannot be of more help!

## 2021-05-30 ENCOUNTER — Other Ambulatory Visit: Payer: Self-pay

## 2021-05-30 ENCOUNTER — Ambulatory Visit: Payer: Medicare HMO | Attending: Adult Health

## 2021-05-30 DIAGNOSIS — R41840 Attention and concentration deficit: Secondary | ICD-10-CM | POA: Diagnosis not present

## 2021-05-30 DIAGNOSIS — R278 Other lack of coordination: Secondary | ICD-10-CM | POA: Insufficient documentation

## 2021-05-30 DIAGNOSIS — M6281 Muscle weakness (generalized): Secondary | ICD-10-CM

## 2021-05-30 DIAGNOSIS — R2681 Unsteadiness on feet: Secondary | ICD-10-CM

## 2021-05-30 DIAGNOSIS — R41844 Frontal lobe and executive function deficit: Secondary | ICD-10-CM | POA: Insufficient documentation

## 2021-05-30 DIAGNOSIS — R41842 Visuospatial deficit: Secondary | ICD-10-CM | POA: Diagnosis not present

## 2021-05-30 NOTE — Therapy (Signed)
[facility] [address] [place], [place], [address] Phone: [phone]   Fax:  [phone]  Physical Therapy Treatment  Patient Details  Name: Tiffany Navarro MRN: [id number] Date of Birth: Sep 12, 1956 Referring Provider (PT): Jennye Boroughs, MD (Will send PCP Alvester Chou POC)   Encounter Date: 05/30/2021   PT End of Session - 05/30/21 1111     Visit Number 2    Number of Visits 8    Date for PT Re-Evaluation 07/15/21    Authorization Type [employer] - auth requested 6/21 for 8 visits    PT Start Time 1015    PT Stop Time 1105    PT Time Calculation (min) 50 min    Activity Tolerance Patient tolerated treatment well    Behavior During Therapy [facility] for tasks assessed/performed             Past Medical History:  Diagnosis Date   Hypertension    Hypothyroidism    Osteoarthritis    Thyroid disease     Past Surgical History:  Procedure Laterality Date   BACK SURGERY  27   HIP PINNING Left 2012   x3   pin removed last surgery   JOINT REPLACEMENT     TOTAL KNEE ARTHROPLASTY Left 02/17/2016   Procedure: TOTAL KNEE ARTHROPLASTY;  Surgeon: Vickey Huger, MD;  Location: [place];  Service: Orthopedics;  Laterality: Left;   TOTAL KNEE ARTHROPLASTY Right 05/11/2016   Procedure: TOTAL KNEE ARTHROPLASTY;  Surgeon: Vickey Huger, MD;  Location: [place];  Service: Orthopedics;  Laterality: Right;   TRANSCAROTID ARTERY REVASCULARIZATION  Right 05/16/2021   Procedure: RIGHT TRANSCAROTID ARTERY REVASCULARIZATION;  Surgeon: Serafina Mitchell, MD;  [facility];  Service: Vascular;  Laterality: Right;   ULTRASOUND GUIDANCE FOR VASCULAR ACCESS Left 05/16/2021   Procedure: ULTRASOUND GUIDANCE FOR VASCULAR ACCESS, left femoral vein;  Surgeon: Serafina Mitchell, MD;  Location: [place];  Service: Vascular;  Laterality: Left;    There were no vitals filed for this visit.   Subjective Assessment - 05/30/21 1032     Subjective Describes  L sided dizziness attimes which causes her to lose balance, always to L, feels LE strength is good just concerned about balance issues, no pain    Pertinent History PMH significant for of type 1 diabetes, hypertension, hyperlipidemia, osteoarthritis, hypothyroidism.    Limitations Standing;Walking    How long can you sit comfortably? n/a    How long can you walk comfortably? In house only; Son worried about pt walking outside    Patient Stated Goals Cooking for family and groceries    Pain Onset More than a month ago                               [facility] Adult PT Treatment/Exercise - 05/30/21 0001       Ambulation/Gait   Ambulation/Gait Yes    Ambulation/Gait Assistance 4: Min guard    Assistive device None    Gait Pattern Step-through pattern;Lateral trunk lean to right;Poor foot clearance - left;Left foot flat;Decreased stride length;Decreased weight shift to right    Ambulation Surface Level;Indoor      Knee/Hip Exercises: Aerobic   Nustep L1 arms 8, 8'                 Balance Exercises - 05/30/21 0001       Balance Exercises: Standing   Standing Eyes Opened Wide (  BOA);Head turns;5 reps;Solid surface    Standing Eyes Closed Wide (BOA);Head turns;Solid surface;5 reps    Tandem Stance Eyes open;Eyes closed;5 reps;Limitations    Tandem Stance Time performed head nods/turns 5x ea.            Treatment time spent answering patient's questions, assessing balance issues, HEP establishment and rationale for POC     PT Short Term Goals - 05/30/21 1117       PT SHORT TERM GOAL #1   Title Pt will be independent with initial HEP    Baseline 05/30/21 [id number]    Time 4    Period Weeks    Status Partially Met    Target Date 06/17/21      PT SHORT TERM GOAL #2   Title Pt will be able to perform 5x STS in <13 sec to demo improved functional LE strength    Baseline 18 sec    Time 4    Period Weeks    Status New    Target Date 06/17/21      PT  SHORT TERM GOAL #3   Title Pt will be able to amb at least 1000' mod I on level and unlevel surfaces for improved community amb    Baseline Only walking in the house    Time 4    Period Weeks    Status New    Target Date 06/17/21               PT Long Term Goals - 05/20/21 1630       PT LONG TERM GOAL #1   Title Pt will be independent with progressing her own strength and balance at home    Time 8    Period Weeks    Status New    Target Date 07/15/21      PT LONG TERM GOAL #2   Title Pt will have improved Berg Balance Score to at least 52/56 to demo MDIC for decreasing fall risk    Baseline 48/56    Time 8    Period Weeks    Status New    Target Date 07/15/21      PT LONG TERM GOAL #3   Title Pt will have improved FGA to at least 22/30 for decreased risk of falls    Baseline 19/30    Time 8    Period Weeks    Status New    Target Date 07/15/21                   Plan - 05/30/21 1112     Clinical Impression Statement Todays session focused on establishing a HEP and assessing cause of balance deficits, established HEP to improve balance with vision removed environment, incorporating head turns/nods from a modified tandem stance.  Showing signs of visual/spatial deficits as she is running into objects and this frustartes her.  Majority of session spent answering questions, explaining traetment rationale and POC    Personal Factors and Comorbidities Age;Comorbidity 1;Comorbidity 2;Comorbidity 3+;Finances    Comorbidities type 1 diabetes, hypertension, hyperlipidemia, osteoarthritis, hypothyroidism, R side sciatica, bilat TKA    Examination-Activity Limitations Locomotion Level;Transfers;Stairs;Bed Mobility    Examination-Participation Restrictions Cleaning;Community Activity;Shop;Yard Work;Laundry;Meal Prep    Stability/Clinical Decision Making Evolving/Moderate complexity    Rehab Potential Good    PT Frequency 1x / week    PT Duration 8 weeks    PT  Treatment/Interventions ADLs/Self Care Home Management;Gait training;Stair training;Functional mobility training;Moist Heat;Iontophoresis 32m/ml Dexamethasone;Therapeutic activities;Therapeutic exercise;Balance  training;Neuromuscular re-education;Manual techniques;Patient/family education;Passive range of motion;Dry needling;Taping    PT Next Visit Plan f/u on HEP, check visual fields    PT Home Exercise Plan [email or web address]    Consulted and Agree with Plan of Care Patient             Patient will benefit from skilled therapeutic intervention in order to improve the following deficits and impairments:  Abnormal gait, Difficulty walking, Decreased activity tolerance, Pain, Decreased balance, Decreased mobility, Decreased strength, Impaired sensation  Visit Diagnosis: Muscle weakness (generalized)  Unsteadiness on feet  Other lack of coordination     Problem List Patient Active Problem List   Diagnosis Date Noted   Carotid stenosis, right 43/60/1658   Acute embolic stroke ([place]) 00/63/4949   Diabetes ([place]) 03/23/2019   Numbness 02/16/2018   Parathyroid disease ([place]) 12/23/2016   S/P total knee replacement 02/17/2016   Chronic pain of both knees 01/09/2016   Hypertriglyceridemia 05/30/2014   Hypothyroidism 01/22/2014   Essential hypertension, benign 01/22/2014   Osteoarthritis of left knee 01/22/2014    Jacqulynn Cadet Johanne Mcglade PT 05/30/2021, 11:18 AM  [facility] [address] [place], [place], [address] Phone: [phone]   Fax:  [phone]  Name: Tiffany Navarro MRN: [id number] Date of Birth: 05-Feb-1956

## 2021-05-30 NOTE — Patient Instructions (Signed)
Access Code: [email or web address] URL: [email or web address] Date: 05/30/2021 Prepared by: Gustavus Bryant  Exercises Sit to Stand with Arms Crossed - 2 x daily - 7 x weekly - 3 sets - 5 reps Tandem Stance with Eyes Closed in Corner - 2 x daily - 7 x weekly - 1 sets - 5 reps

## 2021-06-04 ENCOUNTER — Other Ambulatory Visit: Payer: Self-pay

## 2021-06-04 ENCOUNTER — Ambulatory Visit: Payer: Medicare HMO

## 2021-06-04 DIAGNOSIS — R278 Other lack of coordination: Secondary | ICD-10-CM | POA: Diagnosis not present

## 2021-06-04 DIAGNOSIS — R41842 Visuospatial deficit: Secondary | ICD-10-CM | POA: Diagnosis not present

## 2021-06-04 DIAGNOSIS — R41840 Attention and concentration deficit: Secondary | ICD-10-CM | POA: Diagnosis not present

## 2021-06-04 DIAGNOSIS — R41844 Frontal lobe and executive function deficit: Secondary | ICD-10-CM | POA: Diagnosis not present

## 2021-06-04 DIAGNOSIS — R2681 Unsteadiness on feet: Secondary | ICD-10-CM

## 2021-06-04 DIAGNOSIS — M6281 Muscle weakness (generalized): Secondary | ICD-10-CM

## 2021-06-04 NOTE — Therapy (Addendum)
[facility] [address] [place], [place], [address] Phone: [phone]   Fax:  [phone]  Physical Therapy Treatment/DC Summary  Patient Details  Name: Tiffany Navarro MRN: [id number] Date of Birth: 06-Apr-1956 Referring Provider (PT): Jennye Boroughs, MD (Will send PCP Alvester Chou POC)   Encounter Date: 06/04/2021 PHYSICAL THERAPY DISCHARGE SUMMARY  Visits from Start of Care: 3  Current functional level related to goals / functional outcomes: UTA   Remaining deficits: UTA   Education / Equipment: HEP   Patient agrees to discharge. Patient goals were partially met. Patient is being discharged due to not returning since the last visit.    PT End of Session - 06/04/21 1031     Visit Number 3    Number of Visits 8    Date for PT Re-Evaluation 07/15/21    Authorization Type [employer] - auth requested 6/21 for 8 visits    Progress Note Due on Visit 9    PT Start Time 1015    PT Stop Time 1100    PT Time Calculation (min) 45 min    Equipment Utilized During Treatment Gait belt    Activity Tolerance Patient tolerated treatment well    Behavior During Therapy WFL for tasks assessed/performed             Past Medical History:  Diagnosis Date   Hypertension    Hypothyroidism    Osteoarthritis    Thyroid disease     Past Surgical History:  Procedure Laterality Date   BACK SURGERY  32   HIP PINNING Left 2012   x3   pin removed last surgery   JOINT REPLACEMENT     TOTAL KNEE ARTHROPLASTY Left 02/17/2016   Procedure: TOTAL KNEE ARTHROPLASTY;  Surgeon: Vickey Huger, MD;  Location: [facility];  Service: Orthopedics;  Laterality: Left;   TOTAL KNEE ARTHROPLASTY Right 05/11/2016   Procedure: TOTAL KNEE ARTHROPLASTY;  Surgeon: Vickey Huger, MD;  Location: [facility];  Service: Orthopedics;  Laterality: Right;   TRANSCAROTID ARTERY REVASCULARIZATION  Right 05/16/2021   Procedure: RIGHT TRANSCAROTID ARTERY  REVASCULARIZATION;  Surgeon: Serafina Mitchell, MD;  [facility];  Service: Vascular;  Laterality: Right;   ULTRASOUND GUIDANCE FOR VASCULAR ACCESS Left 05/16/2021   Procedure: ULTRASOUND GUIDANCE FOR VASCULAR ACCESS, left femoral vein;  Surgeon: Serafina Mitchell, MD;  Location: [facility];  Service: Vascular;  Laterality: Left;    There were no vitals filed for this visit.   Subjective Assessment - 06/04/21 1015     Subjective Dizziness has resolved since last session, still feels uncertain ambulating on grassy surfaces    Pertinent History PMH significant for of type 1 diabetes, hypertension, hyperlipidemia, osteoarthritis, hypothyroidism.    Limitations Standing;Walking    How long can you sit comfortably? n/a    How long can you walk comfortably? In house only; Son worried about pt walking outside    Patient Stated Goals Cooking for family and groceries    Pain Onset More than a month ago                               [facility] Adult PT Treatment/Exercise - 06/04/21 0001       Transfers   Transfers Sit to Stand;Stand to Sit    Number of Reps 10 reps    Comments arms crossed on airex      Ambulation/Gait   Ambulation/Gait Yes  Ambulation/Gait Assistance 4: Min guard    Ambulation Distance (Feet) 1000 Feet    Assistive device None    Gait Pattern Step-through pattern;Lateral trunk lean to right;Poor foot clearance - left;Left foot flat;Decreased stride length;Decreased weight shift to right    Ambulation Surface Level;Unlevel;Indoor;Outdoor;Grass                 Balance Exercises - 06/04/21 0001       Balance Exercises: Standing   Standing Eyes Opened Wide (BOA);Head turns;Foam/compliant surface;5 reps;Limitations    Standing Eyes Opened Limitations static stand 30s hold    Tandem Stance Eyes closed;Eyes open;Foam/compliant surface;5 reps;Limitations    Tandem Stance Time performed head nods/turns 5x ea.    Retro Gait Upper extremity support;5  reps;Limitations    Retro Gait Limitations 5 trips at counter    Sidestepping Upper extremity support;5 reps;Limitations    Sidestepping Limitations performed 5 trips at counter    Other Standing Exercises cone taps 10x per LE starting with 2 cones decreasing to 1 cone, unilateral to single cone all for 10 reps total, single UE support needed    Other Standing Exercises Comments deadlifts with 5# 10x                 PT Short Term Goals - 05/30/21 1117       PT SHORT TERM GOAL #1   Title Pt will be independent with initial HEP    Baseline 05/30/21 [id number]    Time 4    Period Weeks    Status Partially Met    Target Date 06/17/21      PT SHORT TERM GOAL #2   Title Pt will be able to perform 5x STS in <13 sec to demo improved functional LE strength    Baseline 18 sec    Time 4    Period Weeks    Status New    Target Date 06/17/21      PT SHORT TERM GOAL #3   Title Pt will be able to amb at least 1000' mod I on level and unlevel surfaces for improved community amb    Baseline Only walking in the house    Time 4    Period Weeks    Status New    Target Date 06/17/21               PT Long Term Goals - 05/20/21 1630       PT LONG TERM GOAL #1   Title Pt will be independent with progressing her own strength and balance at home    Time 8    Period Weeks    Status New    Target Date 07/15/21      PT LONG TERM GOAL #2   Title Pt will have improved Berg Balance Score to at least 52/56 to demo MDIC for decreasing fall risk    Baseline 48/56    Time 8    Period Weeks    Status New    Target Date 07/15/21      PT LONG TERM GOAL #3   Title Pt will have improved FGA to at least 22/30 for decreased risk of falls    Baseline 19/30    Time 8    Period Weeks    Status New    Target Date 07/15/21                   Plan - 06/04/21 1059     Clinical Impression  Statement Todays session focuse on standing balance tasks in static and dynamic environments,  EO, EC with single UE support needed for confidence.  Added sidestepping and retrowalking as well as assessed outside ambulation.  Patient notes improving balance and confidence and feels visual field expanding however several redirections needed in session to return to table and remain on path.    Personal Factors and Comorbidities Age;Comorbidity 1;Comorbidity 2;Comorbidity 3+;Finances    Comorbidities type 1 diabetes, hypertension, hyperlipidemia, osteoarthritis, hypothyroidism, R side sciatica, bilat TKA    Examination-Activity Limitations Locomotion Level;Transfers;Stairs;Bed Mobility    Examination-Participation Restrictions Cleaning;Community Activity;Shop;Yard Work;Laundry;Meal Prep    Stability/Clinical Decision Making Evolving/Moderate complexity    Rehab Potential Good    PT Frequency 1x / week    PT Duration 8 weeks    PT Treatment/Interventions ADLs/Self Care Home Management;Gait training;Stair training;Functional mobility training;Moist Heat;Iontophoresis 65m/ml Dexamethasone;Therapeutic activities;Therapeutic exercise;Balance training;Neuromuscular re-education;Manual techniques;Patient/family education;Passive range of motion;Dry needling;Taping    PT Next Visit Plan f/u on visual fields and balance in vision removed setting, assess balance w/o UE support    PT Home Exercise Plan [email or web address]    Consulted and Agree with Plan of Care Patient             Patient will benefit from skilled therapeutic intervention in order to improve the following deficits and impairments:  Abnormal gait, Difficulty walking, Decreased activity tolerance, Pain, Decreased balance, Decreased mobility, Decreased strength, Impaired sensation  Visit Diagnosis: Muscle weakness (generalized)  Unsteadiness on feet  Other lack of coordination     Problem List Patient Active Problem List   Diagnosis Date Noted   Carotid stenosis, right 029/57/4734  Acute embolic stroke ([facility] 003/70/9643  Diabetes  ([place] 03/23/2019   Numbness 02/16/2018   [place]roid disease ([place] 12/23/2016   S/P total knee replacement 02/17/2016   Chronic pain of both knees 01/09/2016   Hypertriglyceridemia 05/30/2014   Hypothyroidism 01/22/2014   Essential hypertension, benign 01/22/2014   Osteoarthritis of left knee 01/22/2014    JJacqulynn CadetZiemba PT 06/04/2021, 11:03 AM  [place] [place] [address]Phone: [phone]  Fax:  [phone] Name: Tiffany PAMINTUANMRN: [id number] of Birth: [birth year]

## 2021-06-05 ENCOUNTER — Ambulatory Visit: Payer: Medicare HMO | Admitting: Occupational Therapy

## 2021-06-09 ENCOUNTER — Encounter (HOSPITAL_COMMUNITY): Payer: Medicare HMO

## 2021-06-09 ENCOUNTER — Encounter: Payer: Medicare HMO | Admitting: Surgery

## 2021-06-10 ENCOUNTER — Ambulatory Visit: Payer: Medicare HMO | Admitting: Occupational Therapy

## 2021-06-11 ENCOUNTER — Ambulatory Visit: Payer: Medicare HMO

## 2021-06-12 ENCOUNTER — Encounter: Payer: Medicare HMO | Admitting: Occupational Therapy

## 2021-06-16 ENCOUNTER — Ambulatory Visit: Payer: Medicare HMO | Admitting: Occupational Therapy

## 2021-06-16 DIAGNOSIS — M6281 Muscle weakness (generalized): Secondary | ICD-10-CM | POA: Diagnosis not present

## 2021-06-16 DIAGNOSIS — R41840 Attention and concentration deficit: Secondary | ICD-10-CM | POA: Diagnosis not present

## 2021-06-16 DIAGNOSIS — R2681 Unsteadiness on feet: Secondary | ICD-10-CM

## 2021-06-16 DIAGNOSIS — R278 Other lack of coordination: Secondary | ICD-10-CM

## 2021-06-16 DIAGNOSIS — R41842 Visuospatial deficit: Secondary | ICD-10-CM

## 2021-06-16 DIAGNOSIS — R41844 Frontal lobe and executive function deficit: Secondary | ICD-10-CM | POA: Diagnosis not present

## 2021-06-16 NOTE — Patient Instructions (Signed)
  Strengthening: Resisted Flexion   Hold tubing with _left____ arm(s) at side. Pull forward and up. Move shoulder through pain-free range of motion. Repeat __10__ times per set.  Do _1_ sessions per day , every other day   Strengthening: Resisted Extension   Hold tubing in _left____ hand(s), arm forward. Pull arm back, elbow straight. Repeat _10___ times per set. Do _1__ sessions per day, every other day.   Resisted Horizontal Abduction: Bilateral    Elbow Flexion: Resisted   With tubing held in __left____ hand(s) and other end secured under foot, curl arm up as far as possible. Repeat _10___ times per set. Do _1-2___ sessions per day, every other day.    Elbow Extension: Resisted   Sit in chair with resistive band secured at armrest (or hold with other hand) and __left_____ elbow bent. Straighten elbow. Repeat _10___ times per set.  Do _1___ sessions per day, every other day.   Copyright  VHI. All rights reserved.

## 2021-06-16 NOTE — Therapy (Signed)
[facility] [address] [place], [place], [address] Phone: [phone]   Fax:  [phone]  Occupational Therapy Treatment  Patient Details  Name: Tiffany Navarro MRN: [id number] Date of Birth: 1956-01-22 Referring Provider (OT): Judd Lien, NP (pt referred by hospitalist will send to PCP)   Encounter Date: 06/16/2021   OT End of Session - 06/16/21 1023     Visit Number 4    Number of Visits 24    Date for OT Re-Evaluation 08/12/21    Authorization Type [employer]    Authorization - Visit Number 4    Authorization - Number of Visits 10    OT Start Time 1018    OT Stop Time 1058    OT Time Calculation (min) 40 min    Activity Tolerance Patient tolerated treatment well    Behavior During Therapy [facility] for tasks assessed/performed             Past Medical History:  Diagnosis Date   Hypertension    Hypothyroidism    Osteoarthritis    Thyroid disease     Past Surgical History:  Procedure Laterality Date   BACK SURGERY  82   HIP PINNING Left 2012   x3   pin removed last surgery   JOINT REPLACEMENT     TOTAL KNEE ARTHROPLASTY Left 02/17/2016   Procedure: TOTAL KNEE ARTHROPLASTY;  Surgeon: Dannielle Huh, MD;  Location: [facility] OR;  Service: Orthopedics;  Laterality: Left;   TOTAL KNEE ARTHROPLASTY Right 05/11/2016   Procedure: TOTAL KNEE ARTHROPLASTY;  Surgeon: Dannielle Huh, MD;  Location: [facility] OR;  Service: Orthopedics;  Laterality: Right;   TRANSCAROTID ARTERY REVASCULARIZATION  Right 05/16/2021   Procedure: RIGHT TRANSCAROTID ARTERY REVASCULARIZATION;  Surgeon: Nada Libman, MD;  Location: [facility] OR;  Service: Vascular;  Laterality: Right;   ULTRASOUND GUIDANCE FOR VASCULAR ACCESS Left 05/16/2021   Procedure: ULTRASOUND GUIDANCE FOR VASCULAR ACCESS, left femoral vein;  Surgeon: Nada Libman, MD;  Location: [facility] OR;  Service: Vascular;  Laterality: Left;    There were no vitals filed for this visit.    Subjective Assessment - 06/16/21 1023     Subjective  Denies pain    Patient Stated Goals to be normal, make a pot of meatballs    Currently in Pain? No/denies                     Treatment: copying mod complex peg design, min v.c for correct design, min difficulty overall. Pt demonstrates improved fine motor coordination and visual perceptual skills.             OT Education - 06/16/21 1312     Education Details yellow theraband 10-15 reps each, min-mod v.c and demonstration, recommendation that pt calls her PCP for med refill, if unable to hear from PCP, pt should go to urgent care to get her meds refilled    Person(s) Educated Patient    Methods Explanation;Demonstration;Verbal cues;Handout    Comprehension Verbalized understanding;Returned demonstration;Verbal cues required              OT Short Term Goals - 05/21/21 1204       OT SHORT TERM GOAL #1   Title I with HEP    Time 4    Period Weeks    Status New      OT SHORT TERM GOAL #2   Title Pt will verbalize understanding of compensatory strategies for visual deficits    Time  4    Period Weeks    Status New      OT SHORT TERM GOAL #3   Title Pt will perform all basic ADLS with distant supervision.    Time 4    Period Weeks    Status New      OT SHORT TERM GOAL #4   Title Pt will perform tabletop scanning activities with 80% or better accuracy.    Time 4    Period Weeks      OT SHORT TERM GOAL #5   Title Pt will demonstrate ability to retrieve a lightweight object from a midlevel shelf with LUE demonstrating good control.    Time 4      Additional Short Term Goals   Additional Short Term Goals Yes      OT SHORT TERM GOAL #6   Title Pt will perform basic home management/ cooking with min A.    Time 4    Period Weeks    Status New      OT SHORT TERM GOAL #7   Title Pt will demonstrate improved fine motor coordination for ADLs as evidenced by decreasing 9 hole peg test to 44 secs  or less.    Time 4    Period Weeks    Status New               OT Long Term Goals - 05/21/21 1235       OT LONG TERM GOAL #1   Title Pt will demonstrate improved fine motor coordination as evideneced by decreasing 9 hole peg test score to 38 secs or less    Time 12    Period Weeks    Status New    Target Date 08/13/21      OT LONG TERM GOAL #2   Title Pt will navigate a busy environment and locate items with 90% or better accuracy.    Time 12    Period Weeks    Status New      OT LONG TERM GOAL #3   Title Pt will perform functional tabletop tasks requiring scanning with 90% or better accuracy    Time 12    Period Weeks    Status New      OT LONG TERM GOAL #4   Title Pt will perform basic home management modified independently    Time 12    Period Weeks    Status New      OT LONG TERM GOAL #5   Title Pt will perform basic cooking demonstrating good safety awareness with distant supervision.    Time 12    Period Weeks    Status New                   Plan - 06/16/21 1037     Clinical Impression Statement Pt is progressing towards goals. She demonstrates understanding of yellow theraband exercises however she may benefit from review.    OT Occupational Profile and History Problem Focused Assessment - Including review of records relating to presenting problem    Occupational performance deficits (Please refer to evaluation for details): ADL's;IADL's;Leisure;Social Participation    Body Structure / Function / Physical Skills ADL;Decreased knowledge of use of DME;Strength;Gait;GMC;Dexterity;Balance;UE functional use;Endurance;IADL;ROM;FMC;Coordination;Flexibility;Mobility;Sensation;Vision    Rehab Potential Good    OT Frequency 2x / week    OT Duration 12 weeks    OT Treatment/Interventions Self-care/ADL training;Moist Heat;Fluidtherapy;DME and/or AE instruction;Balance training;Therapeutic activities;Aquatic Therapy;Ultrasound;Therapeutic exercise;Cognitive  remediation/compensation;Visual/perceptual remediation/compensation;Passive range of motion;Functional  Mobility Training;Neuromuscular education;Cryotherapy;Electrical Stimulation;Paraffin;Energy conservation;Manual Therapy;Patient/family education    Plan review yellow theraband HEP,  address left negelct    Consulted and Agree with Plan of Care Patient             Patient will benefit from skilled therapeutic intervention in order to improve the following deficits and impairments:   Body Structure / Function / Physical Skills: ADL, Decreased knowledge of use of DME, Strength, Gait, GMC, Dexterity, Balance, UE functional use, Endurance, IADL, ROM, FMC, Coordination, Flexibility, Mobility, Sensation, Vision       Visit Diagnosis: Muscle weakness (generalized)  Unsteadiness on feet  Other lack of coordination  Visuospatial deficit  Attention and concentration deficit  Frontal lobe and executive function deficit    Problem List Patient Active Problem List   Diagnosis Date Noted   Carotid stenosis, right 05/14/2021   Acute embolic stroke (HCC) 05/13/2021   Diabetes (HCC) 03/23/2019   Numbness 02/16/2018   Parathyroid disease (HCC) 12/23/2016   S/P total knee replacement 02/17/2016   Chronic pain of both knees 01/09/2016   Hypertriglyceridemia 05/30/2014   Hypothyroidism 01/22/2014   Essential hypertension, benign 01/22/2014   Osteoarthritis of left knee 01/22/2014    Cashlyn Huguley 06/16/2021, 3:48 PM   [facility] [address] [place], [place], [address] Phone: [phone]   Fax:  [phone]  Name: CHARNESE FEDERICI MRN: [id number] Date of Birth: 09/11/1956

## 2021-06-18 ENCOUNTER — Ambulatory Visit: Payer: Medicare HMO

## 2021-06-18 ENCOUNTER — Ambulatory Visit: Payer: Medicare HMO | Admitting: Occupational Therapy

## 2021-06-20 ENCOUNTER — Telehealth: Payer: Self-pay

## 2021-06-20 ENCOUNTER — Other Ambulatory Visit: Payer: Self-pay | Admitting: Physician Assistant

## 2021-06-20 MED ORDER — CLOPIDOGREL BISULFATE 75 MG PO TABS: 75.0000 mg | ORAL_TABLET | Freq: Every day | ORAL | 0 refills | Status: DC

## 2021-06-20 NOTE — Telephone Encounter (Signed)
Patient was prescribed Plavix by [facility] hospital MD for acute CVA and then patient had TCAR on 6/17 by [facility]. She is unable to get a refill of Plavix by hospital MD and NP who regularly manages her care is currently hospitalized. Patient has been out of Plavix since last Wednesday. Discussed with PA - will call in one month of Plavix. Patient is to see Dr. Myra Gianotti for post TCAR follow up on Monday July 25th. Patient will discuss further medication management with him on Monday.

## 2021-06-23 ENCOUNTER — Ambulatory Visit (HOSPITAL_COMMUNITY)
Admission: RE | Admit: 2021-06-23 | Discharge: 2021-06-23 | Disposition: A | Discharge status: Home / Self Care | Payer: Medicare HMO | Source: Ambulatory Visit | Attending: Surgery | Admitting: Surgery

## 2021-06-23 ENCOUNTER — Ambulatory Visit (INDEPENDENT_AMBULATORY_CARE_PROVIDER_SITE_OTHER): Payer: Medicare HMO | Admitting: Surgery

## 2021-06-23 ENCOUNTER — Other Ambulatory Visit: Payer: Self-pay

## 2021-06-23 ENCOUNTER — Ambulatory Visit: Payer: Medicare HMO | Admitting: Occupational Therapy

## 2021-06-23 ENCOUNTER — Encounter: Payer: Self-pay | Admitting: Surgery

## 2021-06-23 VITALS — BP 125/76 | HR 72 | Temp 98.1°F | Resp 20 | Ht 65.0 in | Wt 182.0 lb

## 2021-06-23 DIAGNOSIS — I6521 Occlusion and stenosis of right carotid artery: Secondary | ICD-10-CM

## 2021-06-23 MED ORDER — ATORVASTATIN CALCIUM 40 MG PO TABS: 40.0000 mg | ORAL_TABLET | Freq: Every day | ORAL | 9 refills | Status: DC

## 2021-06-23 NOTE — Progress Notes (Signed)
Patient name: Tiffany Navarro MRN: [id number] DOB: June 03, 1956 Sex: female  REASON FOR VISIT:     Post op  HISTORY OF PRESENT ILLNESS:   Tiffany Navarro is a 65 y.o. female who presented to the hospital with left-sided deficits and amaurosis fugax.  She was found to have a 75% right carotid stenosis which was felt to be the etiology of her events.  On 05/16/2021 she underwent right sided TCAR.  Operative findings include a 60 to 70% stenosis.  A 9 x 40 stent was placed.  She is back today doing very well.  She will occasionally get tingling down her left arm and some headaches which are improving.  She has good days and bad days.  CURRENT MEDICATIONS:    Current Outpatient Medications  Medication Sig Dispense Refill   aspirin EC 81 MG EC tablet Take 1 tablet (81 mg total) by mouth daily. Swallow whole.     atorvastatin (LIPITOR) 40 MG tablet Take 1 tablet (40 mg total) by mouth daily. 30 tablet 0   Blood Glucose Monitoring Suppl (FREESTYLE LITE) DEVI Use four times a day as needed DX E11.9, E10.9, Z79.4 1 each 1   Cholecalciferol (VITAMIN D3) 1.25 MG (50000 UT) CAPS Take 1 capsule by mouth once a week.     clopidogrel (PLAVIX) 75 MG tablet Take 1 tablet (75 mg total) by mouth daily. 30 tablet 0   fenofibrate 160 MG tablet Take 160 mg by mouth daily.     FreeStyle Unistick II Lancets MISC Use four times a day as needed DX E11.9, E10.9, Z79.4 200 each 3   glucose blood (CONTOUR NEXT TEST) test strip 1 each by Other route 3 (three) times daily. TID     glucose blood (FREESTYLE LITE) test strip Use four times a day as needed DX E11.9, E10.9, Z79.4 100 each 12   hydrochlorothiazide (HYDRODIURIL) 25 MG tablet Take 25 mg by mouth daily.     Insulin Glargine (BASAGLAR KWIKPEN) 100 UNIT/ML Inject 68 Units into the skin every morning.     Insulin Pen Needle 32G X 4 MM MISC by Does not apply route.     levothyroxine (SYNTHROID) 112 MCG tablet Take 112 mcg by  mouth daily.     lisinopril (ZESTRIL) 5 MG tablet Take 5 mg by mouth daily.     Melatonin 5 MG TABS Take 5 mg by mouth at bedtime.     pantoprazole (PROTONIX) 40 MG tablet Take 40 mg by mouth daily.     pregabalin (LYRICA) 50 MG capsule Take 50 mg by mouth 3 (three) times daily.     traMADol (ULTRAM) 50 MG tablet Take 50 mg by mouth in the morning, at noon, and at bedtime.     Vitamin D, Ergocalciferol, (DRISDOL) 50000 units CAPS capsule TAKE 1 CAPSULE (50,000 UNITS TOTAL) BY MOUTH 3 (THREE) TIMES A WEEK. 36 capsule 0   No current facility-administered medications for this visit.    REVIEW OF SYSTEMS:   [X]  denotes positive finding, [ ]  denotes negative finding Cardiac  Comments:  Chest pain or chest pressure:    Shortness of breath upon exertion:    Short of breath when lying flat:    Irregular heart rhythm:    Constitutional    Fever or chills:      PHYSICAL EXAM:   Vitals:   06/23/21 1440 06/23/21 1443  BP: 121/82 125/76  Pulse: 72   Resp: 20   Temp: 98.1 F (36.7 C)  SpO2: 97%   Weight: 182 lb (82.6 kg)   Height: 5\' 5"  (1.651 m)     GENERAL: The patient is a well-nourished female, in no acute distress. The vital signs are documented above. CARDIOVASCULAR: There is a regular rate and rhythm. PULMONARY: Non-labored respirations Incision is healed nicely  STUDIES:   Carotid duplex: Right Carotid: Patent stent with no evidence of stenosis in the right ICA.   Left Carotid: Velocities in the left ICA are consistent with a 1-39%  stenosis.   Vertebrals:  Bilateral vertebral arteries demonstrate antegrade flow.  Subclavians: Normal flow hemodynamics were seen in bilateral subclavian               arteries.   MEDICAL ISSUES:   Status post right-sided TCAR for stroke.  Ultrasound shows her stent to be widely patent.  She will finish out her current bottle of Plavix and then discontinue this.  Once that occurs she can start back on her Mobic.  She will continue her  aspirin indefinitely as well as her statin, which I refilled today.  She will follow-up with an ultrasound in 9 months.  , MD, FACS [facility] [phone] Pager [phone]

## 2021-06-25 ENCOUNTER — Ambulatory Visit: Payer: Medicare HMO

## 2021-06-25 ENCOUNTER — Other Ambulatory Visit: Payer: Self-pay

## 2021-06-25 DIAGNOSIS — I6521 Occlusion and stenosis of right carotid artery: Secondary | ICD-10-CM

## 2021-06-26 ENCOUNTER — Ambulatory Visit: Payer: Medicare HMO | Admitting: Occupational Therapy

## 2021-06-30 ENCOUNTER — Ambulatory Visit: Payer: Medicare HMO | Admitting: Adult Health

## 2021-06-30 ENCOUNTER — Other Ambulatory Visit: Payer: Self-pay

## 2021-06-30 ENCOUNTER — Encounter: Payer: Self-pay | Admitting: Adult Health

## 2021-06-30 VITALS — BP 105/70 | HR 68 | Ht 65.0 in | Wt 185.0 lb

## 2021-06-30 DIAGNOSIS — I10 Essential (primary) hypertension: Secondary | ICD-10-CM

## 2021-06-30 DIAGNOSIS — E785 Hyperlipidemia, unspecified: Secondary | ICD-10-CM

## 2021-06-30 DIAGNOSIS — E1042 Type 1 diabetes mellitus with diabetic polyneuropathy: Secondary | ICD-10-CM

## 2021-06-30 DIAGNOSIS — I63231 Cerebral infarction due to unspecified occlusion or stenosis of right carotid arteries: Secondary | ICD-10-CM | POA: Diagnosis not present

## 2021-06-30 NOTE — Progress Notes (Signed)
[facility] [address] [place] [id number] [phone]       HOSPITAL FOLLOW UP NOTE  Ms. Tiffany FleetingLaurie E Vanderlinden Date of Birth:  08/21/1956 Medical Record Number:  [id number]   Reason for Referral:  hospital stroke follow up    SUBJECTIVE:   CHIEF COMPLAINT:  Chief Complaint  Patient presents with   Follow-up    RM 3 alone Pt is well and stable, just not able to sleep at night     HPI:   Tiffany Navarro is a 65 y.o. female w/pmh of type 1 diabetes, hypertension, hyperlipidemia, hypothyroidism, osteoarthritis who presented on 05/13/2021 with generalized weakness found to have focal left sided weakness on arrival to the ED for which a code stroke was initiated.  Personally reviewed hospitalization pertinent progress notes, lab work and imaging with summary provided.  She was outside of the time window for IVTPA and not eligible for thrombectomy due to lack of LVO.  Stroke work-up revealed right frontal, parietal and occipital stroke in setting of symptomatic right carotid 75% stenosis s/p TCAR 6/17 without complication.  Placed on DAPT postprocedure with duration determined by VVS.  EF 60 to 65%.  LDL 42 - placed on atorvastatin 40 mg daily.  A1c 7.1 with hx of Type 1 DM.  HTN stable allowing for liberal blood pressures in setting of carotid stenosis.  Evaluated by therapies and recommended outpatient PT/OT and discharged home in stable condition  Today, 06/30/2021, [name] is being seen for hospital follow-up.  Stable since discharge without new stroke/TIA symptoms.  Reports residual slight left-sided weakness and mild imbalance. Working with therapies with ongoing improvement. At times can have vision difficulties such as when reading a book which can fluctuate depending on fatigue levels and how well she slept the night prior.  She is currently living with her son but does plan on returning back home living independently when able.  Remains on plavix and  aspirin  as well as atorvastatin 40 mg daily without associated side effects. Plans on completing plavix after her current rx is completed.  Blood pressure today 105/70. Glucose levels typically 110-120s since discharge.  No further concerns at this time.     ROS:   14 system review of systems performed and negative with exception of those listed in HPI  PMH:  Past Medical History:  Diagnosis Date   Carotid artery occlusion    Hypertension    Hypothyroidism    Osteoarthritis    Thyroid disease     PSH:  Past Surgical History:  Procedure Laterality Date   BACK SURGERY  6282   HIP PINNING Left 2012   x3   pin removed last surgery   JOINT REPLACEMENT     TOTAL KNEE ARTHROPLASTY Left 02/17/2016   Procedure: TOTAL KNEE ARTHROPLASTY;  Surgeon: Dannielle HuhSteve Lucey, MD;  Location: [facility] OR;  Service: Orthopedics;  Laterality: Left;   TOTAL KNEE ARTHROPLASTY Right 05/11/2016   Procedure: TOTAL KNEE ARTHROPLASTY;  Surgeon: Dannielle HuhSteve Lucey, MD;  Location: [facility] OR;  Service: Orthopedics;  Laterality: Right;   TRANSCAROTID ARTERY REVASCULARIZATION  Right 05/16/2021   Procedure: RIGHT TRANSCAROTID ARTERY REVASCULARIZATION;  Surgeon: Nada LibmanBrabham, Vance W, MD;  Location: [facility] OR;  Service: Vascular;  Laterality: Right;   ULTRASOUND GUIDANCE FOR VASCULAR ACCESS Left 05/16/2021   Procedure: ULTRASOUND GUIDANCE FOR VASCULAR ACCESS, left femoral vein;  Surgeon: Nada LibmanBrabham, Vance W, MD;  Location: [facility] OR;  Service: Vascular;  Laterality: Left;    Social History:  Social History   Socioeconomic History   Marital status: Widowed    Spouse name: Not on file   Number of children: Not on file   Years of education: Not on file   Highest education level: Not on file  Occupational History   Not on file  Tobacco Use   Smoking status: Some Days    Packs/day: 0.25    Years: 40.00    Pack years: 10.00    Types: Cigarettes   Smokeless tobacco: Never  Substance and Sexual Activity   Alcohol use: No   Drug use: No    Comment: 2  cig /day qod   Sexual activity: Not on file  Other Topics Concern   Not on file  Social History Narrative   Not on file   Social Determinants of Health   Financial Resource Strain: Not on file  Food Insecurity: Not on file  Transportation Needs: Not on file  Physical Activity: Not on file  Stress: Not on file  Social Connections: Not on file  Intimate Partner Violence: Not on file    Family History:  Family History  Problem Relation Age of Onset   Hypertension Mother    Diabetes Mother    Hypertension Father    Stroke Father    Diabetes Father    Hypertension Brother    Diabetes Brother    Thyroid disease Neg Hx     Medications:   Current Outpatient Medications on File Prior to Visit  Medication Sig Dispense Refill   ALPRAZolam (XANAX) 0.25 MG tablet Take 0.25 mg by mouth at bedtime as needed for anxiety.     aspirin EC 81 MG EC tablet Take 1 tablet (81 mg total) by mouth daily. Swallow whole.     atorvastatin (LIPITOR) 40 MG tablet Take 1 tablet (40 mg total) by mouth daily. 90 tablet 9   Blood Glucose Monitoring Suppl (FREESTYLE LITE) DEVI Use four times a day as needed DX E11.9, E10.9, Z79.4 1 each 1   Cholecalciferol (VITAMIN D3) 1.25 MG (50000 UT) CAPS Take 1 capsule by mouth once a week.     clopidogrel (PLAVIX) 75 MG tablet Take 1 tablet (75 mg total) by mouth daily. 30 tablet 0   fenofibrate 160 MG tablet Take 160 mg by mouth daily.     FreeStyle Unistick II Lancets MISC Use four times a day as needed DX E11.9, E10.9, Z79.4 200 each 3   glucose blood (CONTOUR NEXT TEST) test strip 1 each by Other route 3 (three) times daily. TID     glucose blood (FREESTYLE LITE) test strip Use four times a day as needed DX E11.9, E10.9, Z79.4 100 each 12   hydrochlorothiazide (HYDRODIURIL) 25 MG tablet Take 25 mg by mouth daily.     Insulin Glargine (BASAGLAR KWIKPEN) 100 UNIT/ML Inject 68 Units into the skin every morning.     Insulin Pen Needle 32G X 4 MM MISC by Does not  apply route.     levothyroxine (SYNTHROID) 112 MCG tablet Take 112 mcg by mouth daily.     lisinopril (ZESTRIL) 5 MG tablet Take 5 mg by mouth daily.     Melatonin 5 MG TABS Take 5 mg by mouth at bedtime.     pantoprazole (PROTONIX) 40 MG tablet Take 40 mg by mouth daily.     pregabalin (LYRICA) 50 MG capsule Take 50 mg by mouth 3 (three) times daily.     traMADol (ULTRAM) 50 MG tablet Take 50 mg by  mouth in the morning, at noon, and at bedtime.     Vitamin D, Ergocalciferol, (DRISDOL) 50000 units CAPS capsule TAKE 1 CAPSULE (50,000 UNITS TOTAL) BY MOUTH 3 (THREE) TIMES A WEEK. 36 capsule 0   No current facility-administered medications on file prior to visit.    Allergies:  No Known Allergies    OBJECTIVE:  Physical Exam  Vitals:   06/30/21 1351  BP: 105/70  Pulse: 68  Weight: 185 lb (83.9 kg)  Height: 5\' 5"  (1.651 m)   Body mass index is 30.79 kg/m. No results found.  General: well developed, well nourished, pleasant middle-age Caucasian female, seated, in no evident distress Head: head normocephalic and atraumatic.   Neck: supple with no carotid or supraclavicular bruits Cardiovascular: regular rate and rhythm, no murmurs Musculoskeletal: no deformity Skin:  no rash/petichiae Vascular:  Normal pulses all extremities   Neurologic Exam Mental Status: Awake and fully alert. Fluent speech and language. Oriented to place and time. Recent and remote memory intact. Attention span, concentration and fund of knowledge appropriate. Mood and affect appropriate.  Cranial Nerves: Fundoscopic exam reveals sharp disc margins. Pupils equal, briskly reactive to light. Extraocular movements full without nystagmus. Visual fields full to confrontation. Hearing intact. Facial sensation intact. Face, tongue, palate moves normally and symmetrically.  Motor: Normal bulk and tone. Normal strength in all tested extremity muscles except slight decrease left hand dexterity Sensory.: intact to touch  , pinprick , position and vibratory sensation.  Coordination: Rapid alternating movements normal in all extremities except slightly decreased left hand. Finger-to-nose and heel-to-shin performed accurately bilaterally.  Mildly orbits right arm over left arm. Gait and Station: Arises from chair without difficulty. Stance is normal. Gait demonstrates normal stride length and balance without use of assistive device.  Reflexes: 1+ and symmetric. Toes downgoing.     NIHSS  0 Modified Rankin  2      ASSESSMENT: Tiffany Navarro is a 65 y.o. year old female with recent right frontal, parietal and occipital infarct on 05/13/2021 in setting of symptomatic right carotid stenosis s/p TCAR. Vascular risk factors include HTN, HLD, DM type I and carotid stenosis.      PLAN:  R frontal, parietal and occipital strokes:  Residual deficit: mild LUE weakness, gait impairment with imbalance and fluctuating visual difficulties.  Encourage continued participation with outpatient therapies for likely ongoing recovery Continue aspirin 81 mg daily  and atorvastatin 40 mg daily for secondary stroke prevention. Per VVS recommendations, complete current prescription of Plavix and then discontinue. Discussed secondary stroke prevention measures and importance of close PCP follow up for aggressive stroke risk factor management. I have gone over the pathophysiology of stroke, warning signs and symptoms, risk factors and their management in some detail with instructions to go to the closest emergency room for symptoms of concern. Right carotid stenosis: s/p TCAR 6/17. F/u with Dr. 7/17 7/25 with carotid duplex showing patent stent and right ICA and left ICA 1 to 39% stenosis.  Plans to repeat carotid duplex 9 months postprocedure HTN: BP goal <130/90.  Stable on lower side monitor by PCP HLD: LDL goal <70. Recent LDL 42 on atorvastatin 40 mg daily monitored and managed by PCP DMII: A1c goal<7.0. Recent A1c 7.1.   Monitored and managed by PCP    Follow up in 4 months or call earlier if needed   CC:  [facility] provider: Dr. 8/25 PCP: Pearlean Brownie, MD    I spent 47 minutes of face-to-face and non-face-to-face time with patient.  This included previsit chart review, lab review, study review, order entry, electronic health record documentation, patient education regarding recent stroke including etiology, secondary stroke prevention and importance of managing stroke risk factors, residual stroke deficits and further recovery and answered all questions to patient satisfaction  Ihor Austin, [facility]  [facility] [address] [place], [place] [address]  Phone [phone] Fax [phone] Note: This document was prepared with digital dictation and possible smart phrase technology. Any transcriptional errors that result from this process are unintentional.

## 2021-06-30 NOTE — Patient Instructions (Addendum)
Continue working with therapies for likely ongoing recovery  Continue aspirin 81 mg daily  and atorvastatin 40 mg daily for secondary stroke prevention Stop plavix once you complete prescription   Continue to follow up with PCP regarding cholesterol, blood pressure and diabetes management  Maintain strict control of hypertension with blood pressure goal below 130/90, diabetes with hemoglobin A1c goal below 7% and cholesterol with LDL cholesterol (bad cholesterol) goal below 70 mg/dL.       Followup in the future with me in 4 months or call earlier if needed       Thank you for coming to see [place] at [facility]. I hope we have been able to provide you high quality care today.  You may receive a patient satisfaction survey over the next few weeks. We would appreciate your feedback and comments so that we may continue to improve ourselves and the health of our patients.    Stroke Prevention Some medical conditions and lifestyle choices can lead to a higher risk for a stroke. You can help to prevent a stroke by eating healthy foods and exercising. It also helps to not smoke and to manage any health problems youmay have. How can this condition affect me? A stroke is an emergency. It should be treated right away. A stroke can lead to brain damage or threaten your life. There is a better chance of surviving andgetting better after a stroke if you get medical help right away. What can increase my risk? The following medical conditions may increase your risk of a stroke: Diseases of the heart and blood vessels (cardiovascular disease). High blood pressure (hypertension). Diabetes. High cholesterol. Sickle cell disease. Problems with blood clotting. Being very overweight. Sleeping problems (obstructivesleep apnea). Other risk factors include: Being older than age 44. A history of blood clots, stroke, or mini-stroke (TIA). Race, ethnic background, or a family history  of stroke. Smoking or using tobacco products. Taking birth control pills, especially if you smoke. Heavy alcohol and drug use. Not being active. What actions can I take to prevent this? Manage your health conditions High cholesterol. Eat a healthy diet. If this is not enough to manage your cholesterol, you may need to take medicines. Take medicines as told by your doctor. High blood pressure. Try to keep your blood pressure below 130/80. If your blood pressure cannot be managed through a healthy diet and regular exercise, you may need to take medicines. Take medicines as told by your doctor. Ask your doctor if you should check your blood pressure at home. Have your blood pressure checked every year. Diabetes. Eat a healthy diet and get regular exercise. If your blood sugar (glucose) cannot be managed through diet and exercise, you may need to take medicines. Take medicines as told by your doctor. Talk to your doctor about getting checked for sleeping problems. Signs of a problem can include: Snoring a lot. Feeling very tired. Make sure that you manage any other conditions you have. Nutrition  Follow instructions from your doctor about what to eat or drink. You may be told to: Eat and drink fewer calories each day. Limit how much salt (sodium) you use to 1,500 milligrams (mg) each day. Use only healthy fats for cooking, such as olive oil, canola oil, and sunflower oil. Eat healthy foods. To do this: Choose foods that are high in fiber. These include whole grains, and fresh fruits and vegetables. Eat at least 5 servings of fruits and vegetables a day. Try to fill  one-half of your plate with fruits and vegetables at each meal. Choose low-fat (lean) proteins. These include low-fat cuts of meat, chicken without skin, fish, tofu, beans, and nuts. Eat low-fat dairy products. Avoid foods that: Are high in salt. Have saturated fat. Have trans fat. Have cholesterol. Are processed or  pre-made. Count how many carbohydrates you eat and drink each day.  Lifestyle If you drink alcohol: Limit how much you have to: 0-1 drink a day for women who are not pregnant. 0-2 drinks a day for men. Know how much alcohol is in your drink. In the [place] one drink equals one 12 oz bottle of beer ( ), one 5 oz glass of wine ( ), or one 1 oz glass of hard liquor (48mL). Do not smoke or use any products that have nicotine or tobacco. If you need help quitting, ask your doctor. Avoid secondhand smoke. Do not use drugs. Activity  Try to stay at a healthy weight. Get at least 30 minutes of exercise on most days, such as: Fast walking. Biking. Swimming.  Medicines Take over-the-counter and prescription medicines only as told by your doctor. Avoid taking birth control pills. Talk to your doctor about the risks of taking birth control pills if: You are over 73 years old. You smoke. You get very bad headaches. You have had a blood clot. Where to find more information American Stroke Association: www.strokeassociation.org Get help right away if: You or a loved one has any signs of a stroke. "BE FAST" is an easy way to remember the warning signs: B - Balance. Dizziness, sudden trouble walking, or loss of balance. E - Eyes. Trouble seeing or a change in how you see. F - Face. Sudden weakness or loss of feeling of the face. The face or eyelid may droop on one side. A - Arms. Weakness or loss of feeling in an arm. This happens all of a sudden and most often on one side of the body. S - Speech. Sudden trouble speaking, slurred speech, or trouble understanding what people say. T - Time. Time to call emergency services. Write down what time symptoms started. You or a loved one has other signs of a stroke, such as: A sudden, very bad headache with no known cause. Feeling like you may vomit (nausea). Vomiting. A seizure. These symptoms may be an emergency. Get help right away. Call  your local emergency services (911 in the [place] Do not wait to see if the symptoms will go away. Do not drive yourself to the hospital. Summary You can help to prevent a stroke by eating healthy, exercising, and not smoking. It also helps to manage any health problems you have. Do not smoke or use any products that contain nicotine or tobacco. Get help right away if you or a loved one has any signs of a stroke. This information is not intended to replace advice given to you by your health care provider. Make sure you discuss any questions you have with your healthcare provider. Document Revised: 06/17/2020 Document Reviewed: 06/17/2020 Elsevier Patient Education  2022 [employer].

## 2021-07-01 ENCOUNTER — Ambulatory Visit: Payer: Medicare HMO | Attending: Adult Health | Admitting: *Deleted

## 2021-07-02 NOTE — Progress Notes (Signed)
NEW PATIENT APPOINTMENT TO ESTABLISH CARE  Assessment and Plan:  Tiffany Navarro was seen today for new patient (initial visit).  Diagnoses and Navarro orders for this visit:  Essential hypertension, benign      -  continue medications, DASH diet, exercise and monitor at home. Call if greater than 130/80.         - CBC  Type 2 diabetes mellitus with hyperlipidemia (HCC) -     Urinalysis, Routine w reflex microscopic -     Microalbumin / creatinine urine ratio -     Hemoglobin A1c  Type 2 diabetes mellitus without complication, with long-term current use of insulin (HCC)       - Hemoglobin A1C       - Continue 68 units of Basaglar insulin daily  Focus on diet and exercise, continue to follow with Dr. Everardo Navarro       - Microalbumin/creatinine ratio  Hypertriglyceridemia -     COMPLETE METABOLIC PANEL WITH GFR       - Continue Meds and focus on diet and exercise  Acute embolic stroke (HCC) -     CBC with Differential/Platelet - Pt will continue baby ASA and once finished current prescription of Plavix she is to d/c per neurology - Continue to follow with Neurology and continue diet and exercise and medication  Hypothyroidism, unspecified type -     TSH       - Continue Levothyroxine  and will recheck level today and adjust as appropriate.   Carotid stenosis, right       - F/u with Dr. Myra Navarro 7/25 with carotid duplex showing patent stent and right ICA and left ICA 1 to 39% stenosis.  Plans to repeat carotid duplex 9 months postprocedure  Anxiety/Insomnia       - Xanax 0.25mg  QHS as needed, instructed to try to use less than 5 days a week to prevent addiction  Obesity       - Continue to focus on diet and exercise       - Encourage fruits and vegetable, increased fiber and limiting animal fats   Continue diet and meds as discussed. Further disposition pending results of labs. Discussed med's effects and SE's.   Over 40 minutes of exam, counseling, chart review, and critical decision  making was performed.   Future Appointments  Date Time Provider Department Center  11/03/2021  9:15 AM Tiffany Austin, NP [facility] None    ----------------------------------------------------------------------------------------------------------------------  HPI 65 y.o. female  presents for establishing care She had recent neurology visit for hospital follow up from Stroke. The following is copied from [facility] visit 06/30/21 with Tiffany Navarro AGNP.   Tiffany Navarro is a 65 y.o. year old female with recent right frontal, parietal and occipital infarct on 05/13/2021 in setting of symptomatic right carotid stenosis s/p TCAR. Vascular risk factors include HTN, HLD, DM type I and carotid stenosis..   R frontal, parietal and occipital strokes: Residual deficit: mild LUE weakness, gait impairment with imbalance and fluctuating visual difficulties.  Encourage continued participation with outpatient therapies for likely ongoing recovery Continue aspirin 81 mg daily  and atorvastatin 40 mg daily for secondary stroke prevention. Per VVS recommendations, complete current prescription of Plavix and then discontinue. Discussed secondary stroke prevention measures and importance of close PCP follow up for aggressive stroke risk factor management. I have gone over the pathophysiology of stroke, warning signs and symptoms, risk factors and their management in some detail with instructions to go to the  closest emergency room for symptoms of concern. Right carotid stenosis: s/p TCAR 6/17. F/u with Dr. Myra Navarro 7/25 with carotid duplex showing patent stent and right ICA and left ICA 1 to 39% stenosis.  Plans to repeat carotid duplex 9 months postprocedure HTN: BP goal <130/90.  Stable on lower side monitor by PCP HLD: LDL goal <70. Recent LDL 42 on atorvastatin 40 mg daily monitored and managed by PCP DMII: A1c goal<7.0. Recent A1c 7.1.  Monitored and managed by PCP   BMI is Body mass index is 30.92  kg/m., she has been working on diet and exercise. Wt Readings from Last 3 Encounters:  07/03/21 185 lb 12.8 oz (84.3 kg)  06/30/21 185 lb (83.9 kg)  06/23/21 182 lb (82.6 kg)   Her anxiety has been more severe since having stroke.  Using Xanax 0.25 mg PRN for sleep and anxiety  Her blood pressure has been controlled at home, today their BP is BP: 112/70  She does workout. She denies chest pain, shortness of breath, dizziness. BP Readings from Last 3 Encounters:  07/03/21 112/70  06/30/21 105/70  06/23/21 125/76    She is on cholesterol medication Atorvastatin and Fenofibrate and denies myalgias. Her cholesterol is at goal. The cholesterol last visit was:   Lab Results  Component Value Date   CHOL 110 05/17/2021   HDL 36 (L) 05/17/2021   LDLCALC 42 05/17/2021   TRIG 160 (H) 05/17/2021   CHOLHDL 3.1 05/17/2021    She has been working on diet and exercise for diabetes, and denies hyperglycemia, polydipsia, and polyuria. Currently on 68 units of Basaglar QAM . Follows with Dr. Everardo Navarro endocrinologist. Blood sugar this am 118. Last A1C in the office was:  Lab Results  Component Value Date   HGBA1C 7.3 (H) 05/15/2021   Patient is on Vitamin D supplement.  Her value continues to not be at goal Lab Results  Component Value Date   VD25OH 27.51 (L) 02/16/2018     Pt is currently on Levothyroxine .  Last TSH was  Lab Results  Component Value Date   TSH 2.876 05/13/2021      Current Medications:  Current Outpatient Medications on File Prior to Visit  Medication Sig   ALPRAZolam (XANAX) 0.25 MG tablet Take 0.25 mg by mouth at bedtime as needed for anxiety.   aspirin EC 81 MG EC tablet Take 1 tablet (81 mg total) by mouth daily. Swallow whole.   atorvastatin (LIPITOR) 40 MG tablet Take 1 tablet (40 mg total) by mouth daily.   Blood Glucose Monitoring Suppl (FREESTYLE LITE) DEVI Use four times a day as needed DX E11.9, E10.9, Z79.4   Cholecalciferol (VITAMIN D3) 1.25 MG (50000  UT) CAPS Take 1 capsule by mouth once a week.   clopidogrel (PLAVIX) 75 MG tablet Take 1 tablet (75 mg total) by mouth daily.   fenofibrate 160 MG tablet Take 160 mg by mouth daily.   FreeStyle Unistick II Lancets MISC Use four times a day as needed DX E11.9, E10.9, Z79.4   glucose blood (CONTOUR NEXT TEST) test strip 1 each by Other route 3 (three) times daily. TID   glucose blood (FREESTYLE LITE) test strip Use four times a day as needed DX E11.9, E10.9, Z79.4   hydrochlorothiazide (HYDRODIURIL) 25 MG tablet Take 25 mg by mouth daily.   Insulin Glargine (BASAGLAR KWIKPEN) 100 UNIT/ML Inject 68 Units into the skin every morning.   Insulin Pen Needle 32G X 4 MM MISC by Does not apply  route.   levothyroxine (SYNTHROID) 112 MCG tablet Take 112 mcg by mouth daily.   lisinopril (ZESTRIL) 5 MG tablet Take 5 mg by mouth daily.   Melatonin 5 MG TABS Take 5 mg by mouth at bedtime.   pantoprazole (PROTONIX) 40 MG tablet Take 40 mg by mouth daily.   pregabalin (LYRICA) 50 MG capsule Take 50 mg by mouth 3 (three) times daily.   traMADol (ULTRAM) 50 MG tablet Take 50 mg by mouth in the morning, at noon, and at bedtime.   Vitamin D, Ergocalciferol, (DRISDOL) 50000 units CAPS capsule TAKE 1 CAPSULE (50,000 UNITS TOTAL) BY MOUTH 3 (THREE) TIMES A WEEK.   No current facility-administered medications on file prior to visit.     Allergies: No Known Allergies   Medical History:  Past Medical History:  Diagnosis Date   Carotid artery occlusion    Hypertension    Hypothyroidism    Osteoarthritis    Thyroid disease    Family history- Reviewed and unchanged Social history- Reviewed and unchanged   Review of Systems:  Review of Systems  Constitutional:  Negative for chills, fever and weight loss.  HENT:  Negative for congestion and hearing loss.   Eyes:  Positive for blurred vision (when reads for long periods). Negative for double vision.  Respiratory:  Negative for cough and shortness of breath.    Cardiovascular:  Negative for chest pain, palpitations, orthopnea and leg swelling.  Gastrointestinal:  Negative for abdominal pain, constipation, diarrhea, heartburn, nausea and vomiting.  Musculoskeletal:  Positive for back pain and joint pain (knees). Negative for falls and myalgias.  Skin:  Negative for rash.  Neurological:  Positive for headaches (back of head, irregular). Negative for dizziness, tingling, tremors and loss of consciousness.  Psychiatric/Behavioral:  Negative for depression, memory loss and suicidal ideas. The patient is nervous/anxious and has insomnia.      Physical Exam: BP 112/70   Pulse 83   Temp (!) 97.5 F (36.4 C)   Ht 5\' 5"  (1.651 m)   Wt 185 lb 12.8 oz (84.3 kg)   SpO2 97%   BMI 30.92 kg/m  Wt Readings from Last 3 Encounters:  07/03/21 185 lb 12.8 oz (84.3 kg)  06/30/21 185 lb (83.9 kg)  06/23/21 182 lb (82.6 kg)   General Appearance: Well nourished, in no apparent distress. Eyes: PERRLA, EOMs, conjunctiva no swelling or erythema Sinuses: No Frontal/maxillary tenderness ENT/Mouth: Ext aud canals clear, TMs without erythema, bulging. No erythema, swelling, or exudate on post pharynx.  Tonsils not swollen or erythematous. Hearing normal.  Neck: Supple, thyroid normal.  Respiratory: Respiratory effort normal, BS equal bilaterally without rales, rhonchi, wheezing or stridor.  Cardio: RRR with no MRGs. Brisk peripheral pulses without edema.  Abdomen: Soft, + BS.  Non tender, no guarding, rebound, hernias, masses. Lymphatics: Non tender without lymphadenopathy.  Musculoskeletal: Full ROM, 5/5 strength, Normal gait Skin: Warm, dry without rashes, lesions, ecchymosis.  Neuro: Cranial nerves intact. No cerebellar symptoms.  Psych: Awake and oriented X 3, normal affect, Insight and Judgment appropriate.    06/25/21, NP 12:05 PM [facility]

## 2021-07-03 ENCOUNTER — Ambulatory Visit: Payer: Medicare HMO | Admitting: Occupational Therapy

## 2021-07-03 ENCOUNTER — Other Ambulatory Visit: Payer: Self-pay

## 2021-07-03 ENCOUNTER — Ambulatory Visit (INDEPENDENT_AMBULATORY_CARE_PROVIDER_SITE_OTHER): Payer: Medicare HMO | Admitting: Nurse Practitioner

## 2021-07-03 ENCOUNTER — Encounter: Payer: Self-pay | Admitting: Nurse Practitioner

## 2021-07-03 ENCOUNTER — Other Ambulatory Visit: Payer: Self-pay | Admitting: Nurse Practitioner

## 2021-07-03 VITALS — BP 112/70 | HR 83 | Temp 97.5°F | Ht 65.0 in | Wt 185.8 lb

## 2021-07-03 DIAGNOSIS — E119 Type 2 diabetes mellitus without complications: Secondary | ICD-10-CM

## 2021-07-03 DIAGNOSIS — Z794 Long term (current) use of insulin: Secondary | ICD-10-CM

## 2021-07-03 DIAGNOSIS — E039 Hypothyroidism, unspecified: Secondary | ICD-10-CM

## 2021-07-03 DIAGNOSIS — E669 Obesity, unspecified: Secondary | ICD-10-CM

## 2021-07-03 DIAGNOSIS — I639 Cerebral infarction, unspecified: Secondary | ICD-10-CM

## 2021-07-03 DIAGNOSIS — E66811 Obesity, class 1: Secondary | ICD-10-CM | POA: Insufficient documentation

## 2021-07-03 DIAGNOSIS — E785 Hyperlipidemia, unspecified: Secondary | ICD-10-CM

## 2021-07-03 DIAGNOSIS — I10 Essential (primary) hypertension: Secondary | ICD-10-CM

## 2021-07-03 DIAGNOSIS — F419 Anxiety disorder, unspecified: Secondary | ICD-10-CM | POA: Diagnosis not present

## 2021-07-03 DIAGNOSIS — E1169 Type 2 diabetes mellitus with other specified complication: Secondary | ICD-10-CM

## 2021-07-03 DIAGNOSIS — I6521 Occlusion and stenosis of right carotid artery: Secondary | ICD-10-CM

## 2021-07-03 DIAGNOSIS — G4700 Insomnia, unspecified: Secondary | ICD-10-CM

## 2021-07-03 DIAGNOSIS — E781 Pure hyperglyceridemia: Secondary | ICD-10-CM | POA: Diagnosis not present

## 2021-07-03 MED ORDER — BASAGLAR KWIKPEN 100 UNIT/ML ~~LOC~~ SOPN: 68.0000 [IU] | PEN_INJECTOR | SUBCUTANEOUS | 3 refills | Status: DC

## 2021-07-03 MED ORDER — ALPRAZOLAM 0.25 MG PO TABS: 0.2500 mg | ORAL_TABLET | Freq: Every evening | ORAL | 0 refills | Status: DC | PRN

## 2021-07-03 NOTE — Progress Notes (Signed)
I agree with the above plan 

## 2021-07-03 NOTE — Telephone Encounter (Signed)
Left message for pt to call Dr George Hugh office to get a substitution for insulin. Insurance is not wanting to cover.

## 2021-07-03 NOTE — Telephone Encounter (Signed)
Please call pt and have her ask Dr. Everardo All for substitution on this insulin.  Send refill request to his office.  I am uncertain if this is the substitution he would recommend

## 2021-07-03 NOTE — Patient Instructions (Signed)

## 2021-07-04 ENCOUNTER — Other Ambulatory Visit: Payer: Self-pay | Admitting: Nurse Practitioner

## 2021-07-04 DIAGNOSIS — Z794 Long term (current) use of insulin: Secondary | ICD-10-CM

## 2021-07-04 DIAGNOSIS — E119 Type 2 diabetes mellitus without complications: Secondary | ICD-10-CM

## 2021-07-04 DIAGNOSIS — E039 Hypothyroidism, unspecified: Secondary | ICD-10-CM

## 2021-07-04 LAB — COMPLETE METABOLIC PANEL WITH GFR
AG Ratio: 1.4 (calc) (ref 1.0–2.5)
ALT: 16 U/L (ref 6–29)
AST: 14 U/L (ref 10–35)
Albumin: 4.2 g/dL (ref 3.6–5.1)
Alkaline phosphatase (APISO): 69 U/L (ref 37–153)
BUN: 16 mg/dL (ref 7–25)
CO2: 32 mmol/L (ref 20–32)
Calcium: 9.9 mg/dL (ref 8.6–10.4)
Chloride: 101 mmol/L (ref 98–110)
Creat: 0.77 mg/dL (ref 0.50–1.05)
Globulin: 3.1 g/dL (calc) (ref 1.9–3.7)
Glucose, Bld: 162 mg/dL — ABNORMAL HIGH (ref 65–99)
Potassium: 4.7 mmol/L (ref 3.5–5.3)
Sodium: 139 mmol/L (ref 135–146)
Total Bilirubin: 0.3 mg/dL (ref 0.2–1.2)
Total Protein: 7.3 g/dL (ref 6.1–8.1)
eGFR: 86 mL/min/{1.73_m2} (ref >=60)

## 2021-07-04 LAB — URINALYSIS, ROUTINE W REFLEX MICROSCOPIC
Bilirubin Urine: NEGATIVE
Glucose, UA: NEGATIVE
Hgb urine dipstick: NEGATIVE
Ketones, ur: NEGATIVE
Leukocytes,Ua: NEGATIVE
Nitrite: NEGATIVE
Protein, ur: NEGATIVE
Specific Gravity, Urine: 1.018 (ref 1.001–1.035)
pH: 6.5 (ref 5.0–8.0)

## 2021-07-04 LAB — HEMOGLOBIN A1C
Hgb A1c MFr Bld: 7 % of total Hgb — ABNORMAL HIGH (ref <5.7)
Mean Plasma Glucose: 154 mg/dL
eAG (mmol/L): 8.5 mmol/L

## 2021-07-04 LAB — CBC WITH DIFFERENTIAL/PLATELET
Absolute Monocytes: 727 cells/uL (ref 200–950)
Basophils Absolute: 64 cells/uL (ref 0–200)
Basophils Relative: 0.7 %
Eosinophils Absolute: 322 cells/uL (ref 15–500)
Eosinophils Relative: 3.5 %
HCT: 37.7 % (ref 35.0–45.0)
Hemoglobin: 12.4 g/dL (ref 11.7–15.5)
Lymphs Abs: 2815 cells/uL (ref 850–3900)
MCH: 29 pg (ref 27.0–33.0)
MCHC: 32.9 g/dL (ref 32.0–36.0)
MCV: 88.3 fL (ref 80.0–100.0)
MPV: 10.9 fL (ref 7.5–12.5)
Monocytes Relative: 7.9 %
Neutro Abs: 5272 cells/uL (ref 1500–7800)
Neutrophils Relative %: 57.3 %
Platelets: 411 10*3/uL — ABNORMAL HIGH (ref 140–400)
RBC: 4.27 10*6/uL (ref 3.80–5.10)
RDW: 13.1 % (ref 11.0–15.0)
Total Lymphocyte: 30.6 %
WBC: 9.2 10*3/uL (ref 3.8–10.8)

## 2021-07-04 LAB — MICROALBUMIN / CREATININE URINE RATIO
Creatinine, Urine: 78 mg/dL (ref 20–275)
Microalb, Ur: <0.2 mg/dL

## 2021-07-04 LAB — TSH: TSH: 10.25 mIU/L — ABNORMAL HIGH (ref 0.40–4.50)

## 2021-07-04 MED ORDER — LEVOTHYROXINE SODIUM 125 MCG PO TABS: 125.0000 ug | ORAL_TABLET | Freq: Every day | ORAL | 2 refills | Status: DC

## 2021-07-04 MED ORDER — TRESIBA FLEXTOUCH 100 UNIT/ML ~~LOC~~ SOPN: 68.0000 [IU] | PEN_INJECTOR | Freq: Every day | SUBCUTANEOUS | 3 refills | Status: AC

## 2021-07-09 ENCOUNTER — Ambulatory Visit: Payer: Medicare HMO

## 2021-07-12 ENCOUNTER — Other Ambulatory Visit: Payer: Self-pay | Admitting: Physician Assistant

## 2021-07-14 NOTE — Telephone Encounter (Signed)
You may discontinue Plavix per Dr. Myra Gianotti.

## 2021-07-16 ENCOUNTER — Ambulatory Visit: Payer: Medicare HMO

## 2021-07-22 ENCOUNTER — Telehealth: Payer: Self-pay

## 2021-07-22 NOTE — Telephone Encounter (Signed)
Lyrica 25mg  approved until 11/29/21. 12/01/21 Flextouch 100 units approved until 11/29/21.

## 2021-07-23 ENCOUNTER — Ambulatory Visit: Payer: Medicare HMO

## 2021-07-30 ENCOUNTER — Other Ambulatory Visit: Payer: Self-pay | Admitting: Nurse Practitioner

## 2021-07-30 DIAGNOSIS — G4700 Insomnia, unspecified: Secondary | ICD-10-CM

## 2021-07-30 DIAGNOSIS — F419 Anxiety disorder, unspecified: Secondary | ICD-10-CM

## 2021-07-30 MED ORDER — ALPRAZOLAM 0.25 MG PO TABS: 0.2500 mg | ORAL_TABLET | Freq: Every evening | ORAL | 0 refills | Status: DC | PRN

## 2021-08-06 ENCOUNTER — Telehealth: Payer: Self-pay

## 2021-08-06 ENCOUNTER — Other Ambulatory Visit: Payer: Self-pay | Admitting: Nurse Practitioner

## 2021-08-06 DIAGNOSIS — Z794 Long term (current) use of insulin: Secondary | ICD-10-CM

## 2021-08-06 DIAGNOSIS — E119 Type 2 diabetes mellitus without complications: Secondary | ICD-10-CM

## 2021-08-06 MED ORDER — TRESIBA 100 UNIT/ML ~~LOC~~ SOLN: 72.0000 [IU] | Freq: Every day | SUBCUTANEOUS | 3 refills | Status: DC

## 2021-08-06 NOTE — Telephone Encounter (Signed)
Increase Tresiba to 72 units a day. Continue to monitor blood sugar daily. Follow up in 1 month with blood sugar log

## 2021-08-06 NOTE — Telephone Encounter (Signed)
Patient called and said her blood sugars have been 187, 179, 169 first thing in the mornings. She is also having frequent night sweats and urination.   I called patient back to see if she had taken metformin before and she said it made her very sick and was put in the hospital.

## 2021-08-14 ENCOUNTER — Other Ambulatory Visit: Payer: Self-pay | Admitting: Nurse Practitioner

## 2021-08-14 DIAGNOSIS — Z794 Long term (current) use of insulin: Secondary | ICD-10-CM

## 2021-08-14 DIAGNOSIS — E785 Hyperlipidemia, unspecified: Secondary | ICD-10-CM

## 2021-08-14 DIAGNOSIS — E119 Type 2 diabetes mellitus without complications: Secondary | ICD-10-CM

## 2021-08-14 DIAGNOSIS — E1169 Type 2 diabetes mellitus with other specified complication: Secondary | ICD-10-CM

## 2021-08-14 MED ORDER — INSULIN DEGLUDEC 100 UNIT/ML ~~LOC~~ SOPN: 72.0000 [IU] | PEN_INJECTOR | Freq: Every day | SUBCUTANEOUS | 1 refills | Status: AC

## 2021-08-14 NOTE — Progress Notes (Signed)
Pt wanted tresiba kwikpen instead of vials of insulin.

## 2021-09-01 ENCOUNTER — Other Ambulatory Visit: Payer: Self-pay | Admitting: Nurse Practitioner

## 2021-09-01 DIAGNOSIS — F419 Anxiety disorder, unspecified: Secondary | ICD-10-CM

## 2021-09-01 DIAGNOSIS — G4700 Insomnia, unspecified: Secondary | ICD-10-CM

## 2021-09-11 ENCOUNTER — Telehealth: Payer: Self-pay | Admitting: Nurse Practitioner

## 2021-09-11 NOTE — Telephone Encounter (Signed)
Pt is on Tramadol from previous practice for back pain and joint pain.  Advised she would need to follow with pain clinic for pain medication

## 2021-09-11 NOTE — Telephone Encounter (Signed)
Tiffany Navarro w/ [facility] called to request referral for patient who called her office for appointment regarding back pain. Please advise your recommendation.

## 2021-09-12 ENCOUNTER — Other Ambulatory Visit: Payer: Self-pay | Admitting: Nurse Practitioner

## 2021-09-12 DIAGNOSIS — M25562 Pain in left knee: Secondary | ICD-10-CM

## 2021-09-12 DIAGNOSIS — G8929 Other chronic pain: Secondary | ICD-10-CM

## 2021-09-12 NOTE — Telephone Encounter (Signed)
I put the referral in. Thank you.  

## 2021-09-25 DIAGNOSIS — M1990 Unspecified osteoarthritis, unspecified site: Secondary | ICD-10-CM | POA: Diagnosis not present

## 2021-09-25 DIAGNOSIS — R002 Palpitations: Secondary | ICD-10-CM | POA: Diagnosis not present

## 2021-09-25 DIAGNOSIS — E1140 Type 2 diabetes mellitus with diabetic neuropathy, unspecified: Secondary | ICD-10-CM | POA: Diagnosis not present

## 2021-09-25 DIAGNOSIS — E785 Hyperlipidemia, unspecified: Secondary | ICD-10-CM | POA: Diagnosis not present

## 2021-09-26 DIAGNOSIS — E559 Vitamin D deficiency, unspecified: Secondary | ICD-10-CM | POA: Diagnosis not present

## 2021-09-26 DIAGNOSIS — Z8639 Personal history of other endocrine, nutritional and metabolic disease: Secondary | ICD-10-CM | POA: Diagnosis not present

## 2021-09-26 DIAGNOSIS — R209 Unspecified disturbances of skin sensation: Secondary | ICD-10-CM | POA: Diagnosis not present

## 2021-09-26 DIAGNOSIS — E1165 Type 2 diabetes mellitus with hyperglycemia: Secondary | ICD-10-CM | POA: Diagnosis not present

## 2021-09-26 DIAGNOSIS — E034 Atrophy of thyroid (acquired): Secondary | ICD-10-CM | POA: Diagnosis not present

## 2021-09-26 DIAGNOSIS — R203 Hyperesthesia: Secondary | ICD-10-CM | POA: Diagnosis not present

## 2021-09-26 DIAGNOSIS — E785 Hyperlipidemia, unspecified: Secondary | ICD-10-CM | POA: Diagnosis not present

## 2021-10-03 ENCOUNTER — Other Ambulatory Visit: Payer: Self-pay | Admitting: Nurse Practitioner

## 2021-10-03 DIAGNOSIS — E039 Hypothyroidism, unspecified: Secondary | ICD-10-CM

## 2021-10-07 ENCOUNTER — Ambulatory Visit: Payer: Medicare HMO | Admitting: Nurse Practitioner

## 2021-10-13 ENCOUNTER — Inpatient Hospital Stay: Payer: Medicare Other

## 2021-10-13 ENCOUNTER — Ambulatory Visit: Payer: Medicare Other | Admitting: Cardiology

## 2021-10-13 ENCOUNTER — Other Ambulatory Visit: Payer: Self-pay

## 2021-10-13 ENCOUNTER — Encounter: Payer: Self-pay | Admitting: Cardiology

## 2021-10-13 VITALS — BP 89/52 | HR 60 | Temp 98.0°F | Resp 16 | Ht 65.0 in | Wt 190.8 lb

## 2021-10-13 DIAGNOSIS — I63411 Cerebral infarction due to embolism of right middle cerebral artery: Secondary | ICD-10-CM

## 2021-10-13 DIAGNOSIS — I493 Ventricular premature depolarization: Secondary | ICD-10-CM

## 2021-10-13 DIAGNOSIS — I10 Essential (primary) hypertension: Secondary | ICD-10-CM

## 2021-10-13 DIAGNOSIS — R002 Palpitations: Secondary | ICD-10-CM

## 2021-10-13 DIAGNOSIS — Z9889 Other specified postprocedural states: Secondary | ICD-10-CM

## 2021-10-13 DIAGNOSIS — E1059 Type 1 diabetes mellitus with other circulatory complications: Secondary | ICD-10-CM

## 2021-10-13 DIAGNOSIS — Z95828 Presence of other vascular implants and grafts: Secondary | ICD-10-CM

## 2021-10-13 NOTE — Progress Notes (Signed)
Primary Physician/Referring:  Marletta Lor, NP  Patient ID: Tiffany Navarro, female    DOB: 12/11/1955, 65 y.o.   MRN: [id number]  Chief Complaint  Patient presents with   Palpitations   Irregular Heart Beat   New Patient (Initial Visit)   HPI:    Tiffany Navarro  is a 65 y.o. Caucasian female patient with type 1 diabetes, hypertension, hyperlipidemia, hypothyroidism, history of stroke on 05/13/2021 with left-sided weakness, found to have right frontal, parietal and occipital stroke with right carotid stenosis, underwent TCAR on 05/16/2021.  She is now referred to me for cardiac risk stratification.  Patient states that she was fairly active previously but since stroke has been although not sedentary not been walking or doing any strenuous activities.  Denies chest pain or dyspnea or palpitations.  She has not had any recurrence of TIA-like symptoms.  Past Medical History:  Diagnosis Date   Carotid artery occlusion    Hypercholesteremia    Hypertension    Hypothyroidism    Osteoarthritis    Thyroid disease    Past Surgical History:  Procedure Laterality Date   BACK SURGERY  69   HIP PINNING Left 2012   x3   pin removed last surgery   JOINT REPLACEMENT     TOTAL KNEE ARTHROPLASTY Left 02/17/2016   Procedure: TOTAL KNEE ARTHROPLASTY;  Surgeon: Dannielle Huh, MD;  Location: [facility] OR;  Service: Orthopedics;  Laterality: Left;   TOTAL KNEE ARTHROPLASTY Right 05/11/2016   Procedure: TOTAL KNEE ARTHROPLASTY;  Surgeon: Dannielle Huh, MD;  Location: [facility] OR;  Service: Orthopedics;  Laterality: Right;   TRANSCAROTID ARTERY REVASCULARIZATION  Right 05/16/2021   Procedure: RIGHT TRANSCAROTID ARTERY REVASCULARIZATION;  Surgeon: Nada Libman, MD;  Location: [facility] OR;  Service: Vascular;  Laterality: Right;   ULTRASOUND GUIDANCE FOR VASCULAR ACCESS Left 05/16/2021   Procedure: ULTRASOUND GUIDANCE FOR VASCULAR ACCESS, left femoral vein;  Surgeon: Nada Libman, MD;  Location: [facility] OR;  Service:  Vascular;  Laterality: Left;   Family History  Problem Relation Age of Onset   Hypertension Mother    Diabetes Mother    Hypertension Father    Stroke Father    Diabetes Father    Hypertension Brother    Diabetes Brother    Cancer Brother        skin   Thyroid disease Neg Hx     Social History   Tobacco Use   Smoking status: Former    Packs/day: 0.25    Years: 40.00    Pack years: 10.00    Types: Cigarettes    Quit date: 2017    Years since quitting: 5.8   Smokeless tobacco: Never  Substance Use Topics   Alcohol use: No   Marital Status: Widowed  ROS  Review of Systems  Eyes:  Positive for visual disturbance (left eye).  Cardiovascular:  Negative for chest pain, dyspnea on exertion and leg swelling.  Gastrointestinal:  Negative for melena.  Neurological:  Positive for focal weakness (left sided weakness) and paresthesias (left).  Objective  Blood pressure (!) 89/52, pulse 60, temperature 98 F (36.7 C), temperature source Temporal, resp. rate 16, height 5\' 5"  (1.651 m), weight 190 lb 12.8 oz (86.5 kg), SpO2 97 %. Body mass index is 31.75 kg/m.  Vitals with BMI 10/13/2021 07/03/2021 06/30/2021  Height 5\' 5"  5\' 5"  5\' 5"   Weight 190 lbs 13 oz 185 lbs 13 oz 185 lbs  BMI 31.75 30.92 30.79  Systolic 89 112 105  Diastolic 52 70 70  Pulse 60 83 68    Physical Exam Neck:     Vascular: No carotid bruit or JVD.  Cardiovascular:     Rate and Rhythm: Normal rate and regular rhythm. Occasional Extrasystoles are present.    Pulses: Intact distal pulses.     Heart sounds: Normal heart sounds. No murmur heard.   No gallop.  Pulmonary:     Effort: Pulmonary effort is normal.     Breath sounds: Normal breath sounds.  Abdominal:     General: Bowel sounds are normal.     Palpations: Abdomen is soft.  Musculoskeletal:        General: No swelling.     Laboratory examination:   Recent Labs    05/15/21 0127 05/16/21 0352 05/17/21 0508 07/03/21 1228  NA 137 137 139 139  K  3.7 4.2 3.7 4.7  CL 105 106 106 101  CO2 23 21* 24 32  GLUCOSE 146* 128* 132* 162*  BUN 16 12 11 16   CREATININE 0.85 0.76 0.72 0.77  CALCIUM 8.7* 9.1 9.1 9.9  GFRNONAA >60 >60 >60  --    CrCl cannot be calculated (Patient's most recent lab result is older than the maximum 21 days allowed.).  CMP Latest Ref Rng & Units 07/03/2021 05/17/2021 05/16/2021  Glucose 65 - 99 mg/dL 162(H) 132(H) 128(H)  BUN 7 - 25 mg/dL 16 11 12   Creatinine 0.50 - 1.05 mg/dL 0.77 0.72 0.76  Sodium 135 - 146 mmol/L 139 139 137  Potassium 3.5 - 5.3 mmol/L 4.7 3.7 4.2  Chloride 98 - 110 mmol/L 101 106 106  CO2 20 - 32 mmol/L 32 24 21(L)  Calcium 8.6 - 10.4 mg/dL 9.9 9.1 9.1  Total Protein 6.1 - 8.1 g/dL 7.3 - -  Total Bilirubin 0.2 - 1.2 mg/dL 0.3 - -  Alkaline Phos 38 - 126 U/L - - -  AST 10 - 35 U/L 14 - -  ALT 6 - 29 U/L 16 - -   CBC Latest Ref Rng & Units 07/03/2021 05/17/2021 05/16/2021  WBC 3.8 - 10.8 Thousand/uL 9.2 10.5 7.2  Hemoglobin 11.7 - 15.5 g/dL 12.4 11.9(L) 12.3  Hematocrit 35.0 - 45.0 % 37.7 36.0 38.3  Platelets 140 - 400 Thousand/uL 411(H) 353 334    Lipid Panel Recent Labs    05/14/21 0514 05/17/21 0508  CHOL 103 110  TRIG 119 160*  LDLCALC 35 42  VLDL 24 32  HDL 44 36*  CHOLHDL 2.3 3.1   Lipid Panel     Component Value Date/Time   CHOL 110 05/17/2021 0508   TRIG 160 (H) 05/17/2021 0508   HDL 36 (L) 05/17/2021 0508   CHOLHDL 3.1 05/17/2021 0508   VLDL 32 05/17/2021 0508   LDLCALC 42 05/17/2021 0508     HEMOGLOBIN A1C Lab Results  Component Value Date   HGBA1C 7.0 (H) 07/03/2021   MPG 154 07/03/2021   TSH Recent Labs    05/13/21 1800 07/03/21 1228  TSH 2.876 10.25*   Medications and allergies   Allergies  Allergen Reactions   Metformin And Related Swelling    Tongue and throat swelling     Medication prior to this encounter:   Outpatient Medications Prior to Visit  Medication Sig Dispense Refill   ALPRAZolam (XANAX) 0.25 MG tablet TAKE 1 TABLET BY MOUTH AT  BEDTIME AS NEEDED FOR ANXIETY. 30 tablet 0   aspirin EC 81 MG EC tablet Take 1 tablet (81 mg total) by mouth  daily. Swallow whole.     atorvastatin (LIPITOR) 40 MG tablet Take 1 tablet (40 mg total) by mouth daily. 90 tablet 9   Blood Glucose Monitoring Suppl (FREESTYLE LITE) DEVI Use four times a day as needed DX E11.9, E10.9, Z79.4 1 each 1   fenofibrate 160 MG tablet Take 160 mg by mouth daily.     FreeStyle Unistick II Lancets MISC Use four times a day as needed DX E11.9, E10.9, Z79.4 200 each 3   glucose blood (CONTOUR NEXT TEST) test strip 1 each by Other route 3 (three) times daily. TID     hydrochlorothiazide (HYDRODIURIL) 25 MG tablet Take 25 mg by mouth daily.     insulin degludec (TRESIBA) 100 UNIT/ML FlexTouch Pen Inject 72 Units into the skin daily. 42 mL 1   Insulin Pen Needle 32G X 4 MM MISC by Does not apply route.     levothyroxine (SYNTHROID) 125 MCG tablet Take 1 tablet (125 mcg total) by mouth daily. 30 tablet 2   lisinopril (ZESTRIL) 5 MG tablet Take 5 mg by mouth daily.     pantoprazole (PROTONIX) 40 MG tablet Take 40 mg by mouth daily.     pregabalin (LYRICA) 50 MG capsule Take 50 mg by mouth 3 (three) times daily.     traMADol (ULTRAM) 50 MG tablet Take 50 mg by mouth in the morning, at noon, and at bedtime.     Cholecalciferol (VITAMIN D3) 1.25 MG (50000 UT) CAPS Take 1 capsule by mouth once a week.     clopidogrel (PLAVIX) 75 MG tablet Take 1 tablet (75 mg total) by mouth daily. 30 tablet 0   glucose blood (FREESTYLE LITE) test strip Use four times a day as needed DX E11.9, E10.9, Z79.4 100 each 12   Melatonin 5 MG TABS Take 5 mg by mouth at bedtime.     Vitamin D, Ergocalciferol, (DRISDOL) 50000 units CAPS capsule TAKE 1 CAPSULE (50,000 UNITS TOTAL) BY MOUTH 3 (THREE) TIMES A WEEK. 36 capsule 0   No facility-administered medications prior to visit.     Medication list after today's encounter   Current Outpatient Medications  Medication Instructions   ALPRAZolam  (XANAX) 0.25 MG tablet TAKE 1 TABLET BY MOUTH AT BEDTIME AS NEEDED FOR ANXIETY.   aspirin 81 mg, Oral, Daily, Swallow whole.   atorvastatin (LIPITOR) 40 mg, Oral, Daily   Blood Glucose Monitoring Suppl (FREESTYLE LITE) DEVI Use four times a day as needed DX E11.9, E10.9, Z79.4   fenofibrate 160 mg, Oral, Daily   FreeStyle Unistick II Lancets MISC Use four times a day as needed DX E11.9, E10.9, Z79.4   glucose blood (CONTOUR NEXT TEST) test strip 1 each, Other, 3 times daily, TID   hydrochlorothiazide (HYDRODIURIL) 25 mg, Oral, Daily   insulin degludec (TRESIBA) 72 Units, Subcutaneous, Daily   Insulin Pen Needle 32G X 4 MM MISC Does not apply   levothyroxine (SYNTHROID) 125 mcg, Oral, Daily   lisinopril (ZESTRIL) 5 mg, Oral, Daily   pantoprazole (PROTONIX) 40 mg, Oral, Daily   pregabalin (LYRICA) 50 mg, Oral, 3 times daily   traMADol (ULTRAM) 50 mg, Oral, 3 times daily    Radiology:   No results found.  Cardiac Studies:   Echocardiogram 05/14/2021:   1. Left ventricular ejection fraction, by estimation, is 60 to 65%. The left ventricle has normal function. The left ventricle has no regional wall motion abnormalities. Left ventricular diastolic parameters were normal.  2. Right ventricular systolic function is normal. The  right ventricular size is normal. Tricuspid regurgitation signal is inadequate for assessing PA pressure.  Carotid artery duplex 06/23/2021: Right Carotid: Patent stent with no evidence of stenosis in the right ICA. Left Carotid: Velocities in the left ICA are consistent with a 1-39% stenosis. Vertebrals:  Bilateral vertebral arteries demonstrate antegrade flow. Subclavians: Normal flow hemodynamics were seen in bilateral subclavian arteries.  EKG:   EKG 10/13/2021: Normal sinus rhythm at rate of 72 bpm, left axis deviation, left anterior fascicular block.  Incomplete right bundle branch block.  No evidence of ischemia, normal QT interval.  No significant change from  05/13/2021.  Assessment     ICD-10-CM   1. Palpitations  R00.2 EKG 12-Lead    2. PVC (premature ventricular contraction)  I49.3 [facility] MYOCARDIAL PERFUSION WO LEXISCAN    3. Cerebrovascular accident (CVA) due to embolism of right middle cerebral artery (HCC)  I63.411 LONG TERM MONITOR (3-14 DAYS)    4. Type 1 diabetes mellitus with other circulatory complication (HCC)  E10.59 PCV MYOCARDIAL PERFUSION WO LEXISCAN    5. Primary hypertension  I10     6. History of right common carotid artery stent placement 05/16/2021-TCAR  Z98.890 PCV MYOCARDIAL PERFUSION WO LEXISCAN   Y60.630        Medications Discontinued During This Encounter  Medication Reason   Cholecalciferol (VITAMIN D3) 1.25 MG (50000 UT) CAPS Error   clopidogrel (PLAVIX) 75 MG tablet Error   glucose blood (FREESTYLE LITE) test strip Error   Vitamin D, Ergocalciferol, (DRISDOL) 50000 units CAPS capsule Error   Melatonin 5 MG TABS Error    No orders of the defined types were placed in this encounter.  Orders Placed This Encounter  Procedures   LONG TERM MONITOR (3-14 DAYS)    Standing Status:   Future    Standing Expiration Date:   10/13/2022    Order Specific Question:   Where should this test be performed?    Answer:   [facility]    Order Specific Question:   Does the patient have an implanted cardiac device?    Answer:   No    Order Specific Question:   Prescribed days of wear    Answer:   14   [facility] MYOCARDIAL PERFUSION WO LEXISCAN    Standing Status:   Future    Standing Expiration Date:   12/13/2021   EKG 12-Lead   Recommendations:   TATA TIMMINS is a 65 y.o. Caucasian female patient with type 1 diabetes, hypertension, hyperlipidemia, hypothyroidism, history of stroke on 05/13/2021 with left-sided weakness, found to have right frontal, parietal and occipital stroke with right carotid stenosis, underwent TCAR on 05/16/2021.  She is presently recuperating well and has finished physical therapy.  Physical  examination is unremarkable except for ectopy on auscultation.  I reviewed her labs, I reviewed her imaging studies, it appears that right carotid stenosis was probably the culprit lesion leading to embolic complications.  However in view of her age, mild obesity, hypertension as risk factor along with diabetes mellitus, would like to exclude atrial fibrillation and also evaluate her for significant coronary artery disease.  I will perform Zio patch monitoring for 2 weeks, I will also set up for a exercise nuclear stress test as her CV risk is >20% in view of underlying medical comorbidity and also because of frequent PVCs noted on the EKG and also during physical exam today although our EKG today did not reveal PVCs.  I would like to see her back in 6  weeks for follow-up.  Blood pressures well controlled although low she is completely asymptomatic.  She is on low-dose ACE inhibitor.  Did not make any changes to her medications.  I reviewed her lipid profile testing and advised her to reduce calories and hopefully the triglycerides will certainly improve with that.  Dietary discussion was held as well.    Adrian Prows, MD, [facility] 10/13/2021, 9:57 AM Office: [phone]

## 2021-10-27 ENCOUNTER — Other Ambulatory Visit: Payer: Medicare Other

## 2021-11-03 ENCOUNTER — Ambulatory Visit: Payer: Medicare Other | Admitting: Adult Health

## 2021-11-10 ENCOUNTER — Other Ambulatory Visit: Payer: Self-pay | Admitting: Physician Assistant

## 2021-11-10 NOTE — Telephone Encounter (Signed)
Medication was discontinued for you. Continue to take aspirin.

## 2021-11-12 ENCOUNTER — Other Ambulatory Visit: Payer: Self-pay

## 2021-11-12 ENCOUNTER — Ambulatory Visit: Payer: Medicare Other

## 2021-11-12 DIAGNOSIS — Z95828 Presence of other vascular implants and grafts: Secondary | ICD-10-CM

## 2021-11-12 DIAGNOSIS — I493 Ventricular premature depolarization: Secondary | ICD-10-CM

## 2021-11-12 DIAGNOSIS — E1059 Type 1 diabetes mellitus with other circulatory complications: Secondary | ICD-10-CM

## 2021-11-26 ENCOUNTER — Ambulatory Visit: Payer: Medicare Other | Admitting: Adult Health

## 2021-11-26 ENCOUNTER — Encounter: Payer: Self-pay | Admitting: Cardiology

## 2021-11-26 ENCOUNTER — Ambulatory Visit: Payer: Medicare Other | Admitting: Cardiology

## 2021-11-26 ENCOUNTER — Other Ambulatory Visit: Payer: Self-pay

## 2021-11-26 VITALS — BP 127/79 | HR 65 | Resp 16 | Ht 65.0 in | Wt 192.0 lb

## 2021-11-26 DIAGNOSIS — I493 Ventricular premature depolarization: Secondary | ICD-10-CM

## 2021-11-26 DIAGNOSIS — I10 Essential (primary) hypertension: Secondary | ICD-10-CM

## 2021-11-26 DIAGNOSIS — I63411 Cerebral infarction due to embolism of right middle cerebral artery: Secondary | ICD-10-CM

## 2021-11-26 DIAGNOSIS — R002 Palpitations: Secondary | ICD-10-CM

## 2021-11-26 NOTE — Progress Notes (Signed)
Primary Physician/Referring:  Alvester Chou, NP  Patient ID: Tiffany Navarro, female    DOB: 04-09-1956, 65 y.o.   MRN: [id number]  Chief Complaint  Patient presents with   Palpitations   Results   Follow-up   HPI:    Tiffany Navarro  is a 65 y.o. Caucasian female patient with type 1 diabetes, hypertension, hyperlipidemia, hypothyroidism, history of stroke on 05/13/2021 with left-sided weakness, found to have right frontal, parietal and occipital stroke with right carotid stenosis, underwent TCAR on 05/16/2021. In view of her CV risk factors, underwent nuclear stress test and extended EKG monitoring and presents for 6-week follow-up.  She is now referred to me for cardiac risk stratification underwent nuclear stress testing and also extended EKG monitoring and presents for follow-up.  Patient states that she was fairly active previously but since stroke has been although not sedentary not been walking or doing any strenuous activities.  Denies chest pain or dyspnea or palpitations.  She has not had any recurrence of TIA-like symptoms.  Past Medical History:  Diagnosis Date   Carotid artery occlusion    Hypercholesteremia    Hypertension    Hypothyroidism    Osteoarthritis    Thyroid disease    Past Surgical History:  Procedure Laterality Date   BACK SURGERY  44   HIP PINNING Left 2012   x3   pin removed last surgery   JOINT REPLACEMENT     TOTAL KNEE ARTHROPLASTY Left 02/17/2016   Procedure: TOTAL KNEE ARTHROPLASTY;  Surgeon: Vickey Huger, MD;  Location: [place];  Service: Orthopedics;  Laterality: Left;   TOTAL KNEE ARTHROPLASTY Right 05/11/2016   Procedure: TOTAL KNEE ARTHROPLASTY;  Surgeon: Vickey Huger, MD;  Location: [place];  Service: Orthopedics;  Laterality: Right;   TRANSCAROTID ARTERY REVASCULARIZATION  Right 05/16/2021   Procedure: RIGHT TRANSCAROTID ARTERY REVASCULARIZATION;  Surgeon: Serafina Mitchell, MD;  Location: [facility];  Service: Vascular;  Laterality: Right;    ULTRASOUND GUIDANCE FOR VASCULAR ACCESS Left 05/16/2021   Procedure: ULTRASOUND GUIDANCE FOR VASCULAR ACCESS, left femoral vein;  Surgeon: Serafina Mitchell, MD;  Location: [facility];  Service: Vascular;  Laterality: Left;   Family History  Problem Relation Age of Onset   Hypertension Mother    Diabetes Mother    Hypertension Father    Stroke Father    Diabetes Father    Hypertension Brother    Diabetes Brother    Cancer Brother        skin   Thyroid disease Neg Hx     Social History   Tobacco Use   Smoking status: Former    Packs/day: 0.25    Years: 40.00    Pack years: 10.00    Types: Cigarettes    Quit date: 2017    Years since quitting: 5.9   Smokeless tobacco: Never  Substance Use Topics   Alcohol use: No   Marital Status: Widowed  ROS  Review of Systems  Eyes:  Positive for visual disturbance (left eye).  Cardiovascular:  Negative for chest pain, dyspnea on exertion and leg swelling.  Gastrointestinal:  Negative for melena.  Neurological:  Positive for focal weakness (left sided weakness) and paresthesias (left).  Objective  Blood pressure 127/79, pulse 65, resp. rate 16, height 5\' 5"  (1.651 m), weight 192 lb (87.1 kg), SpO2 97 %. Body mass index is 31.95 kg/m.  Vitals with BMI 11/26/2021 10/13/2021 07/03/2021  Height 5\' 5"  5\' 5"  5\' 5"   Weight 192 lbs 190 lbs  13 oz 185 lbs 13 oz  BMI 31.95 [id number]  Systolic [id number]  Diastolic 79 52 70  Pulse 65 60 83    Physical Exam Neck:     Vascular: No carotid bruit or JVD.  Cardiovascular:     Rate and Rhythm: Normal rate and regular rhythm.     Pulses: Intact distal pulses.     Heart sounds: Normal heart sounds. No murmur heard.   No gallop.  Pulmonary:     Effort: Pulmonary effort is normal.     Breath sounds: Normal breath sounds.  Abdominal:     General: Bowel sounds are normal.     Palpations: Abdomen is soft.  Musculoskeletal:        General: No swelling.    Laboratory examination:   Recent Labs     05/15/21 0127 05/16/21 0352 05/17/21 0508 07/03/21 1228  NA 137 137 139 139  K 3.7 4.2 3.7 4.7  CL 105 106 106 101  CO2 23 21* 24 32  GLUCOSE 146* 128* 132* 162*  BUN 16 12 11 16   CREATININE 0.85 0.76 0.72 0.77  CALCIUM 8.7* 9.1 9.1 9.9  GFRNONAA >60 >60 >60  --    CrCl cannot be calculated (Patient's most recent lab result is older than the maximum 21 days allowed.).  CMP Latest Ref Rng & Units 07/03/2021 05/17/2021 05/16/2021  Glucose 65 - 99 mg/dL 162(H) 132(H) 128(H)  BUN 7 - 25 mg/dL 16 11 12   Creatinine 0.50 - 1.05 mg/dL 0.77 0.72 0.76  Sodium 135 - 146 mmol/L 139 139 137  Potassium 3.5 - 5.3 mmol/L 4.7 3.7 4.2  Chloride 98 - 110 mmol/L 101 106 106  CO2 20 - 32 mmol/L 32 24 21(L)  Calcium 8.6 - 10.4 mg/dL 9.9 9.1 9.1  Total Protein 6.1 - 8.1 g/dL 7.3 - -  Total Bilirubin 0.2 - 1.2 mg/dL 0.3 - -  Alkaline Phos 38 - 126 U/L - - -  AST 10 - 35 U/L 14 - -  ALT 6 - 29 U/L 16 - -   CBC Latest Ref Rng & Units 07/03/2021 05/17/2021 05/16/2021  WBC 3.8 - 10.8 Thousand/uL 9.2 10.5 7.2  Hemoglobin 11.7 - 15.5 g/dL 12.4 11.9(L) 12.3  Hematocrit 35.0 - 45.0 % 37.7 36.0 38.3  Platelets 140 - 400 Thousand/uL 411(H) 353 334   Lipid Panel Recent Labs    05/14/21 0514 05/17/21 0508  CHOL 103 110  TRIG 119 160*  LDLCALC 35 42  VLDL 24 32  HDL 44 36*  CHOLHDL 2.3 3.1   Lipid Panel     Component Value Date/Time   CHOL 110 05/17/2021 0508   TRIG 160 (H) 05/17/2021 0508   HDL 36 (L) 05/17/2021 0508   CHOLHDL 3.1 05/17/2021 0508   VLDL 32 05/17/2021 0508   LDLCALC 42 05/17/2021 0508     HEMOGLOBIN A1C Lab Results  Component Value Date   HGBA1C 7.0 (H) 07/03/2021   MPG 154 07/03/2021   TSH Recent Labs    05/13/21 1800 07/03/21 1228  TSH 2.876 10.25*   Medications and allergies   Allergies  Allergen Reactions   Metformin And Related Swelling    Tongue and throat swelling     Medication prior to this encounter:   Outpatient Medications Prior to Visit  Medication  Sig Dispense Refill   ALPRAZolam (XANAX) 0.25 MG tablet TAKE 1 TABLET BY MOUTH AT BEDTIME AS NEEDED FOR ANXIETY. 30 tablet 0   aspirin EC  81 MG EC tablet Take 1 tablet (81 mg total) by mouth daily. Swallow whole.     atorvastatin (LIPITOR) 40 MG tablet Take 1 tablet (40 mg total) by mouth daily. 90 tablet 9   fenofibrate 160 MG tablet Take 160 mg by mouth daily.     FreeStyle Unistick II Lancets MISC Use four times a day as needed DX E11.9, E10.9, Z79.4 200 each 3   glucose blood (CONTOUR NEXT TEST) test strip 1 each by Other route 3 (three) times daily. TID     hydrochlorothiazide (HYDRODIURIL) 25 MG tablet Take 25 mg by mouth daily.     insulin degludec (TRESIBA) 100 UNIT/ML FlexTouch Pen Inject 72 Units into the skin daily. 42 mL 1   Insulin Pen Needle 32G X 4 MM MISC by Does not apply route.     levothyroxine (SYNTHROID) 125 MCG tablet Take 1 tablet (125 mcg total) by mouth daily. 30 tablet 2   lisinopril (ZESTRIL) 5 MG tablet Take 5 mg by mouth daily.     pantoprazole (PROTONIX) 40 MG tablet Take 40 mg by mouth daily.     pregabalin (LYRICA) 50 MG capsule Take 50 mg by mouth 3 (three) times daily.     traMADol (ULTRAM) 50 MG tablet Take 50 mg by mouth in the morning, at noon, and at bedtime.     Blood Glucose Monitoring Suppl (FREESTYLE LITE) DEVI Use four times a day as needed DX E11.9, E10.9, Z79.4 1 each 1   No facility-administered medications prior to visit.    Medication list after today's encounter   Current Outpatient Medications  Medication Instructions   ALPRAZolam (XANAX) 0.25 MG tablet TAKE 1 TABLET BY MOUTH AT BEDTIME AS NEEDED FOR ANXIETY.   aspirin 81 mg, Oral, Daily, Swallow whole.   atorvastatin (LIPITOR) 40 mg, Oral, Daily   fenofibrate 160 mg, Oral, Daily   FreeStyle Unistick II Lancets MISC Use four times a day as needed DX E11.9, E10.9, Z79.4   glucose blood (CONTOUR NEXT TEST) test strip 1 each, Other, 3 times daily, TID   hydrochlorothiazide (HYDRODIURIL) 25  mg, Oral, Daily   insulin degludec (TRESIBA) 72 Units, Subcutaneous, Daily   Insulin Pen Needle 32G X 4 MM MISC Does not apply   levothyroxine (SYNTHROID) 125 mcg, Oral, Daily   lisinopril (ZESTRIL) 5 mg, Oral, Daily   pantoprazole (PROTONIX) 40 mg, Oral, Daily   pregabalin (LYRICA) 50 mg, Oral, 3 times daily   traMADol (ULTRAM) 50 mg, Oral, 3 times daily    Radiology:   No results found.  Cardiac Studies:   Echocardiogram 05/14/2021:   1. Left ventricular ejection fraction, by estimation, is 60 to 65%. The left ventricle has normal function. The left ventricle has no regional wall motion abnormalities. Left ventricular diastolic parameters were normal.  2. Right ventricular systolic function is normal. The right ventricular size is normal. Tricuspid regurgitation signal is inadequate for assessing PA pressure.  Carotid artery duplex 06/23/2021: Right Carotid: Patent stent with no evidence of stenosis in the right ICA. Left Carotid: Velocities in the left ICA are consistent with a 1-39% stenosis. Vertebrals:  Bilateral vertebral arteries demonstrate antegrade flow. Subclavians: Normal flow hemodynamics were seen in bilateral subclavian arteries.  PCV MYOCARDIAL PERFUSION WO LEXISCAN 11/12/2021 Normal ECG stress. Resting EKG normal sinus rhythm, left axis deviation, left anterior fascicular block, incomplete right bundle branch block, low voltage.  Stress EKG is negative for myocardial ischemia.  There were frequent PVCs in trigeminal pattern and occasional couplets  and triplets during stress test, PVC burden decreased at peak exercise to return in recovery.  There was no chest pain, exercise capacity was normal, normal blood pressure response. Myocardial perfusion is normal. Overall LV systolic function is normal without regional wall motion abnormalities. Stress LV EF: 53%. No previous exam available for comparison. Low risk.  Zio Patch Extended out patient EKG monitoring 12 days  starting 10/13/2021: Predominant rhythm is normal sinus rhythm.  Minimum heart rate 46, maximum heart rate 182 bpm.  Average heartbeat 77 bpm. Brief episodes of atrial tachycardia, longest lasting 20 beats.  Total of 14 episodes noted.  No atrial fibrillation.  Occasional PVCs and PACs were noted.  Ventricular bigeminy and trigeminy were also present. Rare ventricular couplets. Symptoms correlated with PVCs.   EKG:   EKG 10/13/2021: Normal sinus rhythm at rate of 72 bpm, left axis deviation, left anterior fascicular block.  Incomplete right bundle branch block.  No evidence of ischemia, normal QT interval.  No significant change from 05/13/2021.  Assessment     ICD-10-CM   1. Cerebrovascular accident (CVA) due to embolism of right middle cerebral artery ([place])  I63.411     2. Palpitations  R00.2     3. PVC (premature ventricular contraction)  I49.3     4. Primary hypertension  I10       Medications Discontinued During This Encounter  Medication Reason   Blood Glucose Monitoring Suppl (FREESTYLE LITE) DEVI     No orders of the defined types were placed in this encounter.   No orders of the defined types were placed in this encounter.  Recommendations:   Tiffany Navarro is a 65 y.o. Caucasian female patient with type 1 diabetes, hypertension, hyperlipidemia, hypothyroidism, history of stroke on 05/13/2021 with left-sided weakness, found to have right frontal, parietal and occipital stroke with right carotid stenosis, underwent TCAR on 05/16/2021. In view of her CV risk factors, underwent nuclear stress test and extended EKG monitoring and presents for 6-week follow-up.  She is presently recuperating well and has finished physical therapy.  Physical examination is unremarkable except for ectopy on auscultation.  I reviewed the results of the echocardiogram that was previously performed, also at the nuclear stress test performed by me which is low risk and extended EKG monitoring  essentially reveals symptomatic PVCs.  Although stroke occurred in the right carotid artery territory, embolic phenomena, cardiac source need to be still kept high on the list.  I discussed regarding potentially placing a loop recorder for monitoring for atrial fibrillation. STROKE AF trial, embolic events, after discussions with Dr. Rosalin Hawking who also agrees with proceeding with loop implantation.  [email or web address]  I discussed the findings with the patient, she is agreeable to proceed with loop recorder implantation.  However I would like to get neurology opinion on this as well prior to loop implantation and message has been sent to neurology.  With regard to hyperlipidemia, she needs repeat lipid profile testing to follow-up on triglycerides, if still elevated, she will consider Vascepa for CV risk reduction.  She appears very motivated for weight loss.  We have discussed regarding low calorie diet.  With regard to visual disturbances, she is seeing an ophthalmologist soon.  Visual disturbances in the left eye hence central vision loss is unlikely.  This is a 40-minute office visit encounter with discussions regarding loop recorder implantation, complications of loop recorder implantation if we decide to proceed with the same and discussions regarding cardiovascular risk reduction.   Adrian Prows,  MD, [facility] 11/26/2021, 1:24 PM Office: [phone]

## 2021-12-11 ENCOUNTER — Ambulatory Visit: Payer: Medicare Other | Admitting: Cardiology

## 2021-12-11 ENCOUNTER — Encounter: Payer: Self-pay | Admitting: Cardiology

## 2021-12-11 ENCOUNTER — Other Ambulatory Visit: Payer: Self-pay

## 2021-12-11 VITALS — BP 142/62 | HR 47 | Temp 98.0°F | Resp 16 | Ht 65.0 in | Wt 194.0 lb

## 2021-12-11 DIAGNOSIS — I63411 Cerebral infarction due to embolism of right middle cerebral artery: Secondary | ICD-10-CM

## 2021-12-11 DIAGNOSIS — I639 Cerebral infarction, unspecified: Secondary | ICD-10-CM | POA: Insufficient documentation

## 2021-12-11 DIAGNOSIS — Z95818 Presence of other cardiac implants and grafts: Secondary | ICD-10-CM | POA: Insufficient documentation

## 2021-12-11 DIAGNOSIS — R002 Palpitations: Secondary | ICD-10-CM

## 2021-12-11 HISTORY — DX: Presence of other cardiac implants and grafts: Z95.818

## 2021-12-11 HISTORY — DX: Cerebral infarction, unspecified: I63.9

## 2021-12-11 NOTE — Progress Notes (Addendum)
Chief Complaint  Patient presents with   Cerebrovascular Accident   loop implant   Hisory brief: Tiffany Navarro  is a 66 y.o. Caucasian female patient with type 1 diabetes, hypertension, hyperlipidemia, hypothyroidism, history of stroke on 05/13/2021 with left-sided weakness, found to have right frontal, parietal and occipital stroke with right carotid stenosis, underwent TCAR on 05/16/2021.     She has not had any recurrence of TIA-like symptoms.  Although stroke occurred in the right carotid artery territory, embolic phenomena, cardiac source need to be still kept high on the list.  I discussed regarding potentially placing a loop recorder for monitoring for atrial fibrillation. STROKE AF trial, embolic events, after discussions with Dr. Rosalin Hawking who also agrees with proceeding with loop implantation.  She now presents for loop implantation. No new symptoms.  Vitals with BMI 12/11/2021 11/26/2021 10/13/2021  Height 5\' 5"  5\' 5"  5\' 5"   Weight 194 lbs 192 lbs 190 lbs 13 oz  BMI 32.28 [id number]  Systolic [id number] [id number] 89  Diastolic 62 79 52  Pulse 47 65 60    Cardiovascular:     Rate and Rhythm: Normal rate and regular rhythm.     Pulses: Intact distal pulses.     Heart sounds: Normal heart sounds. No murmur heard.   No gallop.  Pulmonary:     Effort: Pulmonary effort is normal.     Breath sounds: Normal breath sounds.  Abdominal:     General: Bowel sounds are normal.     Palpations: Abdomen is soft.    Prodedure performed: Insertion of Biotronik Loop Recorder. Serial # [id number]  Indication: Cryptogenic stroke   After obtaining informed consent, explaining the procedure to the patient, under sterile precautions, local anesthesia with 1% lidocaine with epinephrine was injected subcutaneously in the left parasternal region.  20 mL utilized.  A small nick was made in the left paraspinal region at the 3rd intercostal space. Biotronik loop recorder was inserted without any  complications.  Patient tolerated the procedure well.  The incision was closed with derma bond and Steri-Strips.  There is no blood loss, no hematoma.  Written instrtuctions regarding wound care given to the patient.  Device setting:  R wave amplitude 1.75mV.   Programmed:  AF detection to Medium. HVR 180/min. Bradycardia 30 BPM Sudden rate drop OFF Asystole 5 Sec Patient Trigger: OFF    ICD-10-CM   1. Cerebrovascular accident (CVA) due to embolism of right middle cerebral artery ([place])  I63.411     2. Palpitations  R00.2           Adrian Prows, MD, [facility] 12/11/2021, 4:16 PM Office: [phone] Fax: [phone] Pager: [phone]

## 2021-12-16 ENCOUNTER — Telehealth: Payer: Self-pay | Admitting: Cardiology

## 2021-12-17 NOTE — Telephone Encounter (Signed)
NOT URGENT and will take 5 min

## 2021-12-17 NOTE — Telephone Encounter (Signed)
Do you want me to schedule her as soon as next week? Please advise.

## 2021-12-18 ENCOUNTER — Other Ambulatory Visit: Payer: Self-pay

## 2021-12-18 ENCOUNTER — Ambulatory Visit: Payer: Medicare Other | Admitting: Cardiology

## 2021-12-18 DIAGNOSIS — I63411 Cerebral infarction due to embolism of right middle cerebral artery: Secondary | ICD-10-CM

## 2021-12-18 DIAGNOSIS — I639 Cerebral infarction, unspecified: Secondary | ICD-10-CM

## 2021-12-18 DIAGNOSIS — Z95818 Presence of other cardiac implants and grafts: Secondary | ICD-10-CM

## 2021-12-18 MED ORDER — MOXIFLOXACIN HCL 400 MG PO TABS: 400.0000 mg | ORAL_TABLET | Freq: Every day | ORAL | 0 refills | Status: AC

## 2021-12-18 NOTE — Progress Notes (Signed)
Chief Complaint  Patient presents with   Wound Check   Loop    Loop recorder reprogramming    Programmed:  AF detection to Medium. HVR 180/min. Bradycardia 30 BPM Sudden rate drop OFF Asystole 5 Sec Patient Trigger: OFF R-R variability increased to 18% from 12%    ICD-10-CM   1. Loop Biotronik Biomonitor III 12/11/2021  Z95.818 moxifloxacin (AVELOX) 400 MG tablet    2. Cerebrovascular accident (CVA) due to embolism of right middle cerebral artery ([place])  I63.411     3. Cryptogenic stroke ([facility])  I63.9       Site appears slightly red but no discharge.  No fluctuation.  Prophylactically I will place her on Avelox 400 mg daily for 7 days.  She has an appointment to see me in July.   Adrian Prows, MD, [facility] 12/18/2021, 9:46 AM Office: [phone] Fax: [phone] Pager: [phone]

## 2021-12-19 NOTE — Telephone Encounter (Signed)
Schedule pacercheck ([place])

## 2022-01-11 ENCOUNTER — Encounter: Payer: Self-pay | Admitting: Cardiology

## 2022-01-21 ENCOUNTER — Ambulatory Visit: Payer: Medicare Other | Admitting: Adult Health

## 2022-01-28 ENCOUNTER — Other Ambulatory Visit: Payer: Self-pay | Admitting: Nurse Practitioner

## 2022-01-28 DIAGNOSIS — Z794 Long term (current) use of insulin: Secondary | ICD-10-CM

## 2022-01-28 DIAGNOSIS — Z95818 Presence of other cardiac implants and grafts: Secondary | ICD-10-CM

## 2022-01-28 DIAGNOSIS — E1169 Type 2 diabetes mellitus with other specified complication: Secondary | ICD-10-CM

## 2022-01-28 DIAGNOSIS — E119 Type 2 diabetes mellitus without complications: Secondary | ICD-10-CM

## 2022-03-16 NOTE — Progress Notes (Signed)
?[facility] ?[id number] ?[place]. [place] [address] ?[phone] ? ?     STROKE FOLLOW UP NOTE ? ?Ms. Tiffany Navarro ?Date of Birth:  [birth year] ?Medical Record Number:  [id number]  ? ?Reason for Referral: stroke follow up ? ? ? ?SUBJECTIVE: ? ? ?CHIEF COMPLAINT:  ?Chief Complaint  ?Patient presents with  ? Follow-up  ?  Rm 3 alone ?Pt is well and stable, states she feels like her old self.   ? ? ?HPI:  ? ?Update 03/16/2022 JM: Patient returns for stroke follow-up after prior visit 8 months ago unaccompanied.  Overall stable without new stroke/TIA symptoms.  Reports great recovery since prior visit, denies any residual left-sided weakness or balance difficulties.  She has since returned back to her own home and maintains ADLs and IADLs independently.  She keeps active, maintains a healthy diet and ensures adequate sleep. Compliant on aspirin and atorvastatin, denies side effects.  Blood pressure today 119/71. Has repeat lab work tomorrow with PCP. Did have ILR placed 11/2021 by Dr. Jacinto Halim which has not shown atrial fibrillation thus far.  No new concerns at this time. ? ? ? ? ?History provided for reference purposes only ?Initial visit 06/30/2021 JM: Ms. Tiffany Navarro is being seen for hospital follow-up.  Stable since discharge without new stroke/TIA symptoms.  Reports residual slight left-sided weakness and mild imbalance. Working with therapies with ongoing improvement. At times can have vision difficulties such as when reading a book which can fluctuate depending on fatigue levels and how well she slept the night prior.  She is currently living with her son but does plan on returning back home living independently when able.  Remains on plavix and aspirin  as well as atorvastatin 40 mg daily without associated side effects. Plans on completing plavix after her current rx is completed.  Blood pressure today 105/70. Glucose levels typically 110-120s since discharge.  No further concerns at this  time. ? ?Stroke admission 05/13/2021 ?Tiffany Navarro is a 66 y.o. female w/pmh of type 1 diabetes, hypertension, hyperlipidemia, hypothyroidism, osteoarthritis who presented on 05/13/2021 with generalized weakness found to have focal left sided weakness on arrival to the ED for which a code stroke was initiated.  Personally reviewed hospitalization pertinent progress notes, lab work and imaging with summary provided.  She was outside of the time window for IVTPA and not eligible for thrombectomy due to lack of LVO.  Stroke work-up revealed right frontal, parietal and occipital stroke in setting of symptomatic right carotid 75% stenosis s/p TCAR 6/17 without complication.  Placed on DAPT postprocedure with duration determined by [facility].  EF 60 to 65%.  LDL 42 - placed on atorvastatin 40 mg daily.  A1c 7.1 with hx of Type 1 DM.  HTN stable allowing for liberal blood pressures in setting of carotid stenosis.  Evaluated by therapies and recommended outpatient PT/OT and discharged home in stable condition ? ? ? ? ?ROS:   ?14 system review of systems performed and negative with exception of those listed in HPI ? ?PMH:  ?Past Medical History:  ?Diagnosis Date  ? Carotid artery occlusion   ? Cryptogenic stroke (HCC) 12/11/2021  ? Hypercholesteremia   ? Hypertension   ? Hypothyroidism   ? Loop Biotronik Biomonitor III 12/11/2021 12/11/2021  ? Osteoarthritis   ? Thyroid disease   ? ? ?PSH:  ?Past Surgical History:  ?Procedure Laterality Date  ? BACK SURGERY  82  ? HIP PINNING Left 2012  ? x3  pin removed last surgery  ? JOINT REPLACEMENT    ? TOTAL KNEE ARTHROPLASTY Left 02/17/2016  ? Procedure: TOTAL KNEE ARTHROPLASTY;  Surgeon: Vickey Huger, MD;  Location: [facility];  Service: Orthopedics;  Laterality: Left;  ? TOTAL KNEE ARTHROPLASTY Right 05/11/2016  ? Procedure: TOTAL KNEE ARTHROPLASTY;  Surgeon: Vickey Huger, MD;  Location: [facility];  Service: Orthopedics;  Laterality: Right;  ? TRANSCAROTID ARTERY REVASCULARIZATION?  Right  05/16/2021  ? Procedure: RIGHT TRANSCAROTID ARTERY REVASCULARIZATION;  Surgeon: Serafina Mitchell, MD;  Location: [facility] OR;  Service: Vascular;  Laterality: Right;  ? ULTRASOUND GUIDANCE FOR VASCULAR ACCESS Left 05/16/2021  ? Procedure: ULTRASOUND GUIDANCE FOR VASCULAR ACCESS, left femoral vein;  Surgeon: Serafina Mitchell, MD;  Location: [place];  Service: Vascular;  Laterality: Left;  ? ? ?Social History:  ?Social History  ? ?Socioeconomic History  ? Marital status: Widowed  ?  Spouse name: Not on file  ? Number of children: 4  ? Years of education: Not on file  ? Highest education level: Not on file  ?Occupational History  ? Not on file  ?Tobacco Use  ? Smoking status: Former  ?  Packs/day: 0.25  ?  Years: 40.00  ?  Pack years: 10.00  ?  Types: Cigarettes  ?  Quit date: 2017  ?  Years since quitting: 6.2  ? Smokeless tobacco: Never  ?Vaping Use  ? Vaping Use: Never used  ?Substance and Sexual Activity  ? Alcohol use: No  ? Drug use: No  ? Sexual activity: Not on file  ?Other Topics Concern  ? Not on file  ?Social History Narrative  ? Not on file  ? ?Social Determinants of Health  ? ?Financial Resource Strain: Not on file  ?Food Insecurity: Not on file  ?Transportation Needs: Not on file  ?Physical Activity: Not on file  ?Stress: Not on file  ?Social Connections: Not on file  ?Intimate Partner Violence: Not on file  ? ? ?Family History:  ?Family History  ?Problem Relation Age of Onset  ? Hypertension Mother   ? Diabetes Mother   ? Hypertension Father   ? Stroke Father   ? Diabetes Father   ? Hypertension Brother   ? Diabetes Brother   ? Cancer Brother   ?     skin  ? Thyroid disease Neg Hx   ? ? ?Medications:   ?Current Outpatient Medications on File Prior to Visit  ?Medication Sig Dispense Refill  ? ALPRAZolam (XANAX) 0.25 MG tablet TAKE 1 TABLET BY MOUTH AT BEDTIME AS NEEDED FOR ANXIETY. 30 tablet 0  ? aspirin EC 81 MG EC tablet Take 1 tablet (81 mg total) by mouth daily. Swallow whole.    ? atorvastatin (LIPITOR) 40 MG  tablet Take 1 tablet (40 mg total) by mouth daily. 90 tablet 9  ? fenofibrate 160 MG tablet Take 160 mg by mouth daily.    ? FreeStyle Unistick II Lancets MISC Use four times a day as needed DX E11.9, E10.9, Z79.4 200 each 3  ? glucose blood (CONTOUR NEXT TEST) test strip 1 each by Other route 3 (three) times daily. TID    ? insulin degludec (TRESIBA) 100 UNIT/ML FlexTouch Pen Inject 72 Units into the skin daily. 42 mL 1  ? Insulin Pen Needle 32G X 4 MM MISC by Does not apply route.    ? levothyroxine (SYNTHROID) 112 MCG tablet     ? lisinopril (ZESTRIL) 5 MG tablet Take 5 mg by mouth daily.    ?  moxifloxacin (AVELOX) 400 MG tablet Take 1 tablet (400 mg total) by mouth daily at 8 pm. 7 tablet 0  ? pantoprazole (PROTONIX) 40 MG tablet Take 40 mg by mouth daily.    ? pregabalin (LYRICA) 100 MG capsule 2 (two) times daily.    ? traMADol (ULTRAM) 50 MG tablet Take 50 mg by mouth in the morning, at noon, and at bedtime.    ? ?No current facility-administered medications on file prior to visit.  ? ? ?Allergies:   ?Allergies  ?Allergen Reactions  ? Metformin And Related Swelling  ?  Tongue and throat swelling  ? ? ? ? ?OBJECTIVE: ? ?Physical Exam ? ?Vitals:  ? 03/17/22 0804  ?BP: 119/71  ?Pulse: 64  ?Weight: 193 lb (87.5 kg)  ?Height: 5\' 5"  (1.651 m)  ? ?Body mass index is 32.12 kg/m?Marland Kitchen ?No results found. ? ? ?General: well developed, well nourished, pleasant middle-age Caucasian female, seated, in no evident distress ?Head: head normocephalic and atraumatic.   ?Neck: supple with no carotid or supraclavicular bruits ?Cardiovascular: regular rate and rhythm, no murmurs ?Musculoskeletal: no deformity ?Skin:  no rash/petichiae ?Vascular:  Normal pulses all extremities ?  ?Neurologic Exam ?Mental Status: Awake and fully alert. Fluent speech and language. Oriented to place and time. Recent and remote memory intact. Attention span, concentration and fund of knowledge appropriate. Mood and affect appropriate.  ?Cranial Nerves:  Pupils equal, briskly reactive to light. Extraocular movements full without nystagmus. Visual fields full to confrontation. Hearing intact. Facial sensation intact. Face, tongue, palate moves normally and symmetri

## 2022-03-17 ENCOUNTER — Encounter: Payer: Self-pay | Admitting: Adult Health

## 2022-03-17 ENCOUNTER — Ambulatory Visit: Payer: Medicare Other | Admitting: Adult Health

## 2022-03-17 VITALS — BP 119/71 | HR 64 | Ht 65.0 in | Wt 193.0 lb

## 2022-03-17 DIAGNOSIS — I63231 Cerebral infarction due to unspecified occlusion or stenosis of right carotid arteries: Secondary | ICD-10-CM

## 2022-03-17 NOTE — Patient Instructions (Addendum)
Continue aspirin 81 mg daily  and atorvastatin for secondary stroke prevention ? ?Your loop recorder will continue to be monitored by Dr. Einar Gip ? ?Please ensure you follow-up with vascular surgery Dr. Trula Slade with plans on repeat carotid imaging currently scheduled 5/3 ? ?Continue to follow up with PCP regarding cholesterol, diabetes and blood pressure management  ?Maintain strict control of hypertension with blood pressure goal below 130/90, DM with A1c goal<7.0 and cholesterol with LDL cholesterol (bad cholesterol) goal below 70 mg/dL.  ? ?Signs of a Stroke? Follow the BEFAST method:  ?Balance Watch for a sudden loss of balance, trouble with coordination or vertigo ?Eyes Is there a sudden loss of vision in one or both eyes? Or double vision?  ?Face: Ask the person to smile. Does one side of the face droop or is it numb?  ?Arms: Ask the person to raise both arms. Does one arm drift downward? Is there weakness or numbness of a leg? ?Speech: Ask the person to repeat a simple phrase. Does the speech sound slurred/strange? Is the person confused ? ?Time: If you observe any of these signs, call 911. ? ? ? ? ? ? ?Thank you for coming to see [place] at [facility]. I hope we have been able to provide you high quality care today. ? ?You may receive a patient satisfaction survey over the next few weeks. We would appreciate your feedback and comments so that we may continue to improve ourselves and the health of our patients. ? ?

## 2022-03-24 NOTE — Progress Notes (Signed)
?Office Note  ? ? ? ?CC:  follow up ?Requesting Provider:  Alvester Chou, NP ? ?HPI: Tiffany Navarro is a 66 y.o. ([birth year]) female who presents for follow up of carotid artery disease. On 05/16/2021 she underwent right sided TCAR by Dr. Trula Slade.  Operative findings include a 60 to 70% stenosis.This was performed secondary to symptomatic stenosis with amaurosis fugax and left sided weakness. She was last seen in July of 2022 by Dr. Trula Slade, at which time she was doing well. Still with some left sided symptoms but no new neurological deficits. Her duplex showed patent right ICA stent and stable 1-39% left ICA stenosis.  ? ?She returns today for follow up with carotid duplex. She denies any amaurosis fugax or other visual changes, slurred speech, facial drooping, unilateral upper or lower extremity weakness or numbness. She does report some residual left upper extremity weakness. She says she has good and bad days. She describes them as her " stroke days". She explains that on these kind of off days that she will have more weakness and just generalized fatigue. Usually she will nap several times on these days but then by the next day she feels fine and back to normal.  ? ?She has no lower extremity symptoms. She denies any pain on ambulation or rest. No non healing wounds. She takes her dog on a walk 3x/ day without difficulty ? ?The pt is on a statin for cholesterol management.  ?The pt is on a daily aspirin.   Other AC:  none ?The pt is on ACEI for hypertension.   ?The pt is not diabetic.   ?Tobacco hx:  Former, quit 2017 ? ?Past Medical History:  ?Diagnosis Date  ? Carotid artery occlusion   ? Cryptogenic stroke ([place]) 12/11/2021  ? Hypercholesteremia   ? Hypertension   ? Hypothyroidism   ? Loop Biotronik Biomonitor III 12/11/2021 12/11/2021  ? Osteoarthritis   ? Thyroid disease   ? ? ?Past Surgical History:  ?Procedure Laterality Date  ? BACK SURGERY  82  ? HIP PINNING Left 2012  ? x3   pin removed last surgery  ?  JOINT REPLACEMENT    ? TOTAL KNEE ARTHROPLASTY Left 02/17/2016  ? Procedure: TOTAL KNEE ARTHROPLASTY;  Surgeon: Vickey Huger, MD;  Location: [place];  Service: Orthopedics;  Laterality: Left;  ? TOTAL KNEE ARTHROPLASTY Right 05/11/2016  ? Procedure: TOTAL KNEE ARTHROPLASTY;  Surgeon: Vickey Huger, MD;  Location: [place];  Service: Orthopedics;  Laterality: Right;  ? TRANSCAROTID ARTERY REVASCULARIZATION?  Right 05/16/2021  ? Procedure: RIGHT TRANSCAROTID ARTERY REVASCULARIZATION;  Surgeon: Serafina Mitchell, MD;  Location: [facility];  Service: Vascular;  Laterality: Right;  ? ULTRASOUND GUIDANCE FOR VASCULAR ACCESS Left 05/16/2021  ? Procedure: ULTRASOUND GUIDANCE FOR VASCULAR ACCESS, left femoral vein;  Surgeon: Serafina Mitchell, MD;  Location: [place];  Service: Vascular;  Laterality: Left;  ? ? ?Social History  ? ?Socioeconomic History  ? Marital status: Widowed  ?  Spouse name: Not on file  ? Number of children: 4  ? Years of education: Not on file  ? Highest education level: Not on file  ?Occupational History  ? Not on file  ?Tobacco Use  ? Smoking status: Former  ?  Packs/day: 0.25  ?  Years: 40.00  ?  Pack years: 10.00  ?  Types: Cigarettes  ?  Quit date: 2017  ?  Years since quitting: 6.3  ? Smokeless tobacco: Never  ?Vaping Use  ? Vaping Use:  Never used  ?Substance and Sexual Activity  ? Alcohol use: No  ? Drug use: No  ? Sexual activity: Not on file  ?Other Topics Concern  ? Not on file  ?Social History Narrative  ? Not on file  ? ?Social Determinants of Health  ? ?Financial Resource Strain: Not on file  ?Food Insecurity: Not on file  ?Transportation Needs: Not on file  ?Physical Activity: Not on file  ?Stress: Not on file  ?Social Connections: Not on file  ?Intimate Partner Violence: Not on file  ? ? ?Family History  ?Problem Relation Age of Onset  ? Hypertension Mother   ? Diabetes Mother   ? Hypertension Father   ? Stroke Father   ? Diabetes Father   ? Hypertension Brother   ? Diabetes Brother   ? Cancer Brother   ?      skin  ? Thyroid disease Neg Hx   ? ? ?Current Outpatient Medications  ?Medication Sig Dispense Refill  ? ALPRAZolam (XANAX) 0.25 MG tablet TAKE 1 TABLET BY MOUTH AT BEDTIME AS NEEDED FOR ANXIETY. 30 tablet 0  ? aspirin EC 81 MG EC tablet Take 1 tablet (81 mg total) by mouth daily. Swallow whole.    ? atorvastatin (LIPITOR) 40 MG tablet Take 1 tablet (40 mg total) by mouth daily. 90 tablet 9  ? fenofibrate 160 MG tablet Take 160 mg by mouth daily.    ? FreeStyle Unistick II Lancets MISC Use four times a day as needed DX E11.9, E10.9, Z79.4 200 each 3  ? glucose blood (CONTOUR NEXT TEST) test strip 1 each by Other route 3 (three) times daily. TID    ? insulin degludec (TRESIBA) 100 UNIT/ML FlexTouch Pen Inject 72 Units into the skin daily. 42 mL 1  ? Insulin Pen Needle 32G X 4 MM MISC by Does not apply route.    ? LEVEMIR 100 UNIT/ML injection SMARTSIG:60 Unit(s) SUB-Q Daily    ? levothyroxine (SYNTHROID) 112 MCG tablet     ? lisinopril (ZESTRIL) 5 MG tablet Take 5 mg by mouth daily.    ? moxifloxacin (AVELOX) 400 MG tablet Take 1 tablet (400 mg total) by mouth daily at 8 pm. 7 tablet 0  ? pantoprazole (PROTONIX) 40 MG tablet Take 40 mg by mouth daily.    ? pregabalin (LYRICA) 100 MG capsule 2 (two) times daily.    ? traMADol (ULTRAM) 50 MG tablet Take 50 mg by mouth in the morning, at noon, and at bedtime.    ? ?No current facility-administered medications for this visit.  ? ? ?Allergies  ?Allergen Reactions  ? Metformin And Related Swelling  ?  Tongue and throat swelling  ? ? ? ?REVIEW OF SYSTEMS:  ?[X]  denotes positive finding, [ ]  denotes negative finding ?Cardiac  Comments:  ?Chest pain or chest pressure:    ?Shortness of breath upon exertion:    ?Short of breath when lying flat:    ?Irregular heart rhythm:    ?    ?Vascular    ?Pain in calf, thigh, or hip brought on by ambulation:    ?Pain in feet at night that wakes you up from your sleep:     ?Blood clot in your veins:    ?Leg swelling:     ?    ?Pulmonary     ?Oxygen at home:    ?Productive cough:     ?Wheezing:     ?    ?Neurologic    ?Sudden weakness in  arms or legs:     ?Sudden numbness in arms or legs:     ?Sudden onset of difficulty speaking or slurred speech:    ?Temporary loss of vision in one eye:     ?Problems with dizziness:     ?    ?Gastrointestinal    ?Blood in stool:     ?Vomited blood:     ?    ?Genitourinary    ?Burning when urinating:     ?Blood in urine:    ?    ?Psychiatric    ?Major depression:     ?    ?Hematologic    ?Bleeding problems:    ?Problems with blood clotting too easily:    ?    ?Skin    ?Rashes or ulcers:    ?    ?Constitutional    ?Fever or chills:    ? ? ?PHYSICAL EXAMINATION: ? ?Vitals:  ? 04/01/22 0823  ?BP: 113/72  ?Pulse: 64  ?Resp: 20  ?Temp: 98.6 ?F (37 ?C)  ?TempSrc: Temporal  ?SpO2: 99%  ?Weight: 193 lb 14.4 oz (88 kg)  ?Height: 5\' 5"  (1.651 m)  ? ? ?General:  WDWN in NAD; vital signs documented above ?Gait: Normal ?HENT: WNL, normocephalic ?Pulmonary: normal non-labored breathing , without  wheezing ?Cardiac: regular HR, without  Murmurs without carotid bruit ?Abdomen: soft, NT, no masses ?Vascular Exam/Pulses: ? Right Left  ?Radial 2+ (normal) 2+ (normal)  ?Femoral 2+ (normal) 2+ (normal)  ?Popliteal Not palpable Not palpable  ?DP 2+ (normal) 2+ (normal)  ?PT 1+ (weak) 1+ (weak)  ? ?Extremities: without ischemic changes, without Gangrene , without cellulitis; without open wounds;  ?Musculoskeletal: no muscle wasting or atrophy  ?Neurologic: A&O X 3;  No focal weakness or paresthesias are detected ?Psychiatric:  The pt has Normal affect. ? ? ?Non-Invasive Vascular Imaging:   ?VAS US Carotid Duplex Bilateral: 04/01/22 ?Summary:  ?Right Carotid: There is no evidence of stenosis in the right ICA. Widely patent stent.  ? ?Left Carotid: Velocities in the left ICA are consistent with a 1-39% stenosis.  ? ?Vertebrals:  Bilateral vertebral arteries demonstrate antegrade flow.  ?Subclavians: Normal flow hemodynamics were seen in  bilateral subclavian arteries.  ? ? ?ASSESSMENT/PLAN:: 66 y.o. female here for follow up for carotid artery disease. On 05/16/2021 she underwent right sided TCAR by Dr. Trula Slade.  Operative findings include a 90

## 2022-04-01 ENCOUNTER — Ambulatory Visit (HOSPITAL_COMMUNITY)
Admission: RE | Admit: 2022-04-01 | Discharge: 2022-04-01 | Disposition: A | Discharge status: Home / Self Care | Payer: Medicare Other | Source: Ambulatory Visit | Attending: Physician Assistant | Admitting: Physician Assistant

## 2022-04-01 ENCOUNTER — Ambulatory Visit (INDEPENDENT_AMBULATORY_CARE_PROVIDER_SITE_OTHER): Payer: Medicare Other | Admitting: Physician Assistant

## 2022-04-01 VITALS — BP 113/72 | HR 64 | Temp 98.6°F | Resp 20 | Ht 65.0 in | Wt 193.9 lb

## 2022-04-01 DIAGNOSIS — I6521 Occlusion and stenosis of right carotid artery: Secondary | ICD-10-CM | POA: Insufficient documentation

## 2022-05-07 ENCOUNTER — Other Ambulatory Visit: Payer: Self-pay | Admitting: Nurse Practitioner

## 2022-06-03 ENCOUNTER — Ambulatory Visit: Payer: Medicare Other | Admitting: Cardiology

## 2022-07-02 ENCOUNTER — Other Ambulatory Visit: Payer: Self-pay | Admitting: Surgery

## 2022-08-25 ENCOUNTER — Encounter: Payer: Self-pay | Admitting: Cardiology

## 2022-08-25 NOTE — Telephone Encounter (Signed)
From [employer]

## 2023-03-30 ENCOUNTER — Other Ambulatory Visit: Payer: Self-pay | Admitting: *Deleted

## 2023-03-30 DIAGNOSIS — I6521 Occlusion and stenosis of right carotid artery: Secondary | ICD-10-CM

## 2023-04-04 NOTE — Progress Notes (Unsigned)
HISTORY AND PHYSICAL     CC:  follow up. Requesting Provider:  Marletta Lor, NP  HPI: This is a 67 y.o. female here for follow up for carotid artery stenosis.  Pt is s/p right TCAR for symptomatic carotid artery stenosis on 05/16/2021 by Dr. Myra Navarro.    Pt was last seen 04/01/2022 and at that time she did not have any new neurological sx.  She had residual left sided upper extremity weakness. She did not have any lower extremity weakness or claudication, rest pain or wounds.   Pt returns today for follow up.    Pt denies any amaurosis fugax, speech difficulties, weakness, numbness, paralysis or clumsiness or facial droop.    She states that she gets a severe pain in the right top shoulder that will radiate up the back of her head.  She states that she has been told this is arthritis.  If she wears her neck collar, this helps her a great deal.  She will meditate for one hour a day with this collar on and she has relief.  She maintains a healthy diet.  She is not a smoker and has not been in years.    The pt is on a statin for cholesterol management.  The pt is on a daily aspirin.   Other AC:  none The pt is on ACEI for hypertension.   The pt is  on medication for diabetes Tobacco hx:  former  Pt does not have family hx of AAA.  Past Medical History:  Diagnosis Date   Carotid artery occlusion    Cryptogenic stroke (HCC) 12/11/2021   Hypercholesteremia    Hypertension    Hypothyroidism    Loop Biotronik Biomonitor III 12/11/2021 12/11/2021   Osteoarthritis    Thyroid disease     Past Surgical History:  Procedure Laterality Date   BACK SURGERY  82   HIP PINNING Left 2012   x3   pin removed last surgery   JOINT REPLACEMENT     TOTAL KNEE ARTHROPLASTY Left 02/17/2016   Procedure: TOTAL KNEE ARTHROPLASTY;  Surgeon: Dannielle Huh, MD;  Location: [facility] OR;  Service: Orthopedics;  Laterality: Left;   TOTAL KNEE ARTHROPLASTY Right 05/11/2016   Procedure: TOTAL KNEE ARTHROPLASTY;  Surgeon:  Dannielle Huh, MD;  Location: [facility] OR;  Service: Orthopedics;  Laterality: Right;   TRANSCAROTID ARTERY REVASCULARIZATION  Right 05/16/2021   Procedure: RIGHT TRANSCAROTID ARTERY REVASCULARIZATION;  Surgeon: Nada Libman, MD;  Location: [facility] OR;  Service: Vascular;  Laterality: Right;   ULTRASOUND GUIDANCE FOR VASCULAR ACCESS Left 05/16/2021   Procedure: ULTRASOUND GUIDANCE FOR VASCULAR ACCESS, left femoral vein;  Surgeon: Nada Libman, MD;  Location: [facility] OR;  Service: Vascular;  Laterality: Left;    Allergies  Allergen Reactions   Metformin And Related Swelling    Tongue and throat swelling    Current Outpatient Medications  Medication Sig Dispense Refill   ALPRAZolam (XANAX) 0.25 MG tablet TAKE 1 TABLET BY MOUTH AT BEDTIME AS NEEDED FOR ANXIETY. 30 tablet 0   aspirin EC 81 MG EC tablet Take 1 tablet (81 mg total) by mouth daily. Swallow whole.     atorvastatin (LIPITOR) 40 MG tablet TAKE 1 TABLET BY MOUTH EVERY DAY 90 tablet 9   fenofibrate 160 MG tablet Take 160 mg by mouth daily.     FreeStyle Unistick II Lancets MISC Use four times a day as needed DX E11.9, E10.9, Z79.4 200 each 3   glucose blood (CONTOUR NEXT TEST) test  strip 1 each by Other route 3 (three) times daily. TID     insulin degludec (TRESIBA) 100 UNIT/ML FlexTouch Pen Inject 72 Units into the skin daily. 42 mL 1   Insulin Pen Needle 32G X 4 MM MISC by Does not apply route.     LEVEMIR 100 UNIT/ML injection SMARTSIG:60 Unit(s) SUB-Q Daily     levothyroxine (SYNTHROID) 112 MCG tablet      lisinopril (ZESTRIL) 5 MG tablet Take 5 mg by mouth daily.     moxifloxacin (AVELOX) 400 MG tablet Take 1 tablet (400 mg total) by mouth daily at 8 pm. 7 tablet 0   pantoprazole (PROTONIX) 40 MG tablet Take 40 mg by mouth daily.     pregabalin (LYRICA) 100 MG capsule 2 (two) times daily.     traMADol (ULTRAM) 50 MG tablet Take 50 mg by mouth in the morning, at noon, and at bedtime.     No current facility-administered medications for  this visit.    Family History  Problem Relation Age of Onset   Hypertension Mother    Diabetes Mother    Hypertension Father    Stroke Father    Diabetes Father    Hypertension Brother    Diabetes Brother    Cancer Brother        skin   Thyroid disease Neg Hx     Social History   Socioeconomic History   Marital status: Widowed    Spouse name: Not on file   Number of children: 4   Years of education: Not on file   Highest education level: Not on file  Occupational History   Not on file  Tobacco Use   Smoking status: Former    Packs/day: 0.25    Years: 40.00    Additional pack years: 0.00    Total pack years: 10.00    Types: Cigarettes    Quit date: 2017    Years since quitting: 7.3   Smokeless tobacco: Never  Vaping Use   Vaping Use: Never used  Substance and Sexual Activity   Alcohol use: No   Drug use: No   Sexual activity: Not on file  Other Topics Concern   Not on file  Social History Narrative   Not on file   Social Determinants of Health   Financial Resource Strain: Not on file  Food Insecurity: Not on file  Transportation Needs: Not on file  Physical Activity: Not on file  Stress: Not on file  Social Connections: Not on file  Intimate Partner Violence: Not on file     REVIEW OF SYSTEMS:   [X]  denotes positive finding, [ ]  denotes negative finding Cardiac  Comments:  Chest pain or chest pressure:    Shortness of breath upon exertion:    Short of breath when lying flat:    Irregular heart rhythm:        Vascular    Pain in calf, thigh, or hip brought on by ambulation:    Pain in feet at night that wakes you up from your sleep:     Blood clot in your veins:    Leg swelling:         Pulmonary    Oxygen at home:    Productive cough:     Wheezing:         Neurologic    Sudden weakness in arms or legs:     Sudden numbness in arms or legs:     Sudden onset of difficulty  speaking or slurred speech:    Temporary loss of vision in one  eye:     Problems with dizziness:         Gastrointestinal    Blood in stool:     Vomited blood:         Genitourinary    Burning when urinating:     Blood in urine:        Psychiatric    Major depression:         Hematologic    Bleeding problems:    Problems with blood clotting too easily:        Skin    Rashes or ulcers:        Constitutional    Fever or chills:      PHYSICAL EXAMINATION:  Today's Vitals   04/05/23 1029 04/05/23 1036  BP: 103/69 96/65  Pulse: 68 62  Resp: 16   Temp: 97.6 F (36.4 C)   TempSrc: Temporal   SpO2: 97%   Weight: 174 lb (78.9 kg)   Height: 5\' 5"  (1.651 m)   PainSc: 10-Worst pain ever    Body mass index is 28.96 kg/m.   General:  WDWN in NAD; vital signs documented above Gait: Not observed HENT: WNL, normocephalic Pulmonary: normal non-labored breathing Cardiac: regular HR, without carotid bruits Abdomen: soft, NT; aortic pulse is not palpable Skin: without rashes Vascular Exam/Pulses:  Right Left  Radial 2+ (normal) 2+ (normal)  DP 2+ (normal) 2+ (normal)   Extremities: without open wounds Musculoskeletal: no muscle wasting or atrophy  Neurologic: A&O X 3; moving all extremities equally; speech is fluent/normal Psychiatric:  The pt has Normal affect.   Non-Invasive Vascular Imaging:   Carotid Duplex on 562024 Right:  patent stent without evidence of restenosis Left:  1-39% ICA stenosis Vertebrals:  Bilateral vertebral arteries demonstrate antegrade flow.  Subclavians: Normal flow hemodynamics were seen in bilateral subclavian arteries.   Previous Carotid duplex on 04/01/2022: Right: normal Left:   1-39% ICA stenosis    ASSESSMENT/PLAN:: 67 y.o. female here for follow up carotid artery stenosis and is s/p right TCAR for symptomatic carotid artery stenosis on 05/16/2021 by Dr. Myra Navarro.    -duplex today reveals right carotid stent without stenosis and left is 1-39% ICA stenosis -discussed s/s of stroke with pt and  she understands should she develop any of these sx, she will go to the nearest ER or call 911. -pt will f/u in one year with carotid duplex -pt will call sooner should she have any issues. -continue statin/asa    Doreatha Massed, [facility] [phone]  Clinic MD:  Tiffany Navarro

## 2023-04-05 ENCOUNTER — Encounter: Payer: Self-pay | Admitting: Physician Assistant

## 2023-04-05 ENCOUNTER — Ambulatory Visit: Payer: Medicare Other | Admitting: Physician Assistant

## 2023-04-05 ENCOUNTER — Ambulatory Visit (HOSPITAL_COMMUNITY)
Admission: RE | Admit: 2023-04-05 | Discharge: 2023-04-05 | Disposition: A | Discharge status: Home / Self Care | Payer: Medicare Other | Source: Ambulatory Visit | Attending: Surgery | Admitting: Surgery

## 2023-04-05 VITALS — BP 96/65 | HR 62 | Temp 97.6°F | Resp 16 | Ht 65.0 in | Wt 174.0 lb

## 2023-04-05 DIAGNOSIS — I6521 Occlusion and stenosis of right carotid artery: Secondary | ICD-10-CM | POA: Diagnosis not present

## 2023-04-12 ENCOUNTER — Other Ambulatory Visit: Payer: Self-pay

## 2023-04-12 DIAGNOSIS — I6521 Occlusion and stenosis of right carotid artery: Secondary | ICD-10-CM

## 2023-09-01 ENCOUNTER — Ambulatory Visit (INDEPENDENT_AMBULATORY_CARE_PROVIDER_SITE_OTHER): Payer: Medicare Other

## 2023-09-01 DIAGNOSIS — I63411 Cerebral infarction due to embolism of right middle cerebral artery: Secondary | ICD-10-CM

## 2023-09-01 LAB — CUP PACEART REMOTE DEVICE CHECK
Date Time Interrogation Session: 20241002120026
Implantable Pulse Generator Implant Date: 20221207
Pulse Gen Model: 436066
Pulse Gen Serial Number: 94078688

## 2023-09-17 NOTE — Progress Notes (Signed)
Carelink Summary Report / Loop Recorder 

## 2023-10-04 ENCOUNTER — Ambulatory Visit (INDEPENDENT_AMBULATORY_CARE_PROVIDER_SITE_OTHER): Payer: Medicare Other

## 2023-10-04 DIAGNOSIS — I63411 Cerebral infarction due to embolism of right middle cerebral artery: Secondary | ICD-10-CM | POA: Diagnosis not present

## 2023-10-05 LAB — CUP PACEART REMOTE DEVICE CHECK
Date Time Interrogation Session: 20241105105708
Implantable Pulse Generator Implant Date: 20221207
Pulse Gen Model: 436066
Pulse Gen Serial Number: 94078688

## 2023-10-19 NOTE — Progress Notes (Signed)
Biotronik Loop Recorder 

## 2023-11-08 ENCOUNTER — Ambulatory Visit (INDEPENDENT_AMBULATORY_CARE_PROVIDER_SITE_OTHER): Payer: Medicare Other

## 2023-11-08 DIAGNOSIS — Z95818 Presence of other cardiac implants and grafts: Secondary | ICD-10-CM

## 2023-11-08 LAB — CUP PACEART REMOTE DEVICE CHECK
Date Time Interrogation Session: 20241209092348
Implantable Pulse Generator Implant Date: 20221207
Pulse Gen Model: 436066
Pulse Gen Serial Number: 94078688

## 2023-12-13 ENCOUNTER — Ambulatory Visit: Payer: Medicare Other

## 2023-12-13 DIAGNOSIS — I63411 Cerebral infarction due to embolism of right middle cerebral artery: Secondary | ICD-10-CM | POA: Diagnosis not present

## 2023-12-13 LAB — CUP PACEART REMOTE DEVICE CHECK
Date Time Interrogation Session: 20250113091554
Implantable Pulse Generator Implant Date: 20221207
Pulse Gen Model: 436066
Pulse Gen Serial Number: 94078688

## 2023-12-25 ENCOUNTER — Other Ambulatory Visit: Payer: Self-pay

## 2023-12-25 ENCOUNTER — Encounter (HOSPITAL_BASED_OUTPATIENT_CLINIC_OR_DEPARTMENT_OTHER): Payer: Self-pay | Admitting: Emergency Medicine

## 2023-12-25 ENCOUNTER — Emergency Department (HOSPITAL_BASED_OUTPATIENT_CLINIC_OR_DEPARTMENT_OTHER)
Admission: EM | Admit: 2023-12-25 | Discharge: 2023-12-25 | Disposition: A | Discharge status: Home / Self Care | Payer: Medicare Other | Attending: Emergency Medicine | Admitting: Emergency Medicine

## 2023-12-25 ENCOUNTER — Emergency Department (HOSPITAL_BASED_OUTPATIENT_CLINIC_OR_DEPARTMENT_OTHER): Payer: Medicare Other

## 2023-12-25 DIAGNOSIS — R109 Unspecified abdominal pain: Secondary | ICD-10-CM | POA: Diagnosis present

## 2023-12-25 DIAGNOSIS — K5900 Constipation, unspecified: Secondary | ICD-10-CM | POA: Diagnosis not present

## 2023-12-25 DIAGNOSIS — Z79899 Other long term (current) drug therapy: Secondary | ICD-10-CM | POA: Diagnosis not present

## 2023-12-25 DIAGNOSIS — E119 Type 2 diabetes mellitus without complications: Secondary | ICD-10-CM | POA: Diagnosis not present

## 2023-12-25 DIAGNOSIS — Z794 Long term (current) use of insulin: Secondary | ICD-10-CM | POA: Insufficient documentation

## 2023-12-25 DIAGNOSIS — I10 Essential (primary) hypertension: Secondary | ICD-10-CM | POA: Insufficient documentation

## 2023-12-25 DIAGNOSIS — Z7982 Long term (current) use of aspirin: Secondary | ICD-10-CM | POA: Diagnosis not present

## 2023-12-25 DIAGNOSIS — E039 Hypothyroidism, unspecified: Secondary | ICD-10-CM | POA: Diagnosis not present

## 2023-12-25 LAB — COMPREHENSIVE METABOLIC PANEL
ALT: 21 U/L (ref 0–44)
AST: 20 U/L (ref 15–41)
Albumin: 4.4 g/dL (ref 3.5–5.0)
Alkaline Phosphatase: 40 U/L (ref 38–126)
Anion gap: 7 (ref 5–15)
BUN: 20 mg/dL (ref 8–23)
CO2: 27 mmol/L (ref 22–32)
Calcium: 9.7 mg/dL (ref 8.9–10.3)
Chloride: 104 mmol/L (ref 98–111)
Creatinine, Ser: 0.83 mg/dL (ref 0.44–1.00)
GFR, Estimated: >60 mL/min (ref >60)
Glucose, Bld: 167 mg/dL — ABNORMAL HIGH (ref 70–99)
Potassium: 4.3 mmol/L (ref 3.5–5.1)
Sodium: 138 mmol/L (ref 135–145)
Total Bilirubin: 0.4 mg/dL (ref 0.0–1.2)
Total Protein: 6.8 g/dL (ref 6.5–8.1)

## 2023-12-25 LAB — CBC
HCT: 42 % (ref 36.0–46.0)
Hemoglobin: 13.6 g/dL (ref 12.0–15.0)
MCH: 28.6 pg (ref 26.0–34.0)
MCHC: 32.4 g/dL (ref 30.0–36.0)
MCV: 88.4 fL (ref 80.0–100.0)
Platelets: 314 10*3/uL (ref 150–400)
RBC: 4.75 MIL/uL (ref 3.87–5.11)
RDW: 14.5 % (ref 11.5–15.5)
WBC: 6.8 10*3/uL (ref 4.0–10.5)
nRBC: 0 % (ref 0.0–0.2)

## 2023-12-25 LAB — LIPASE, BLOOD: Lipase: 14 U/L (ref 11–51)

## 2023-12-25 LAB — URINALYSIS, ROUTINE W REFLEX MICROSCOPIC
Bilirubin Urine: NEGATIVE
Glucose, UA: NEGATIVE mg/dL
Hgb urine dipstick: NEGATIVE
Ketones, ur: NEGATIVE mg/dL
Leukocytes,Ua: NEGATIVE
Nitrite: NEGATIVE
Protein, ur: NEGATIVE mg/dL
Specific Gravity, Urine: 1.015 (ref 1.005–1.030)
pH: 5.5 (ref 5.0–8.0)

## 2023-12-25 MED ORDER — IOHEXOL 300 MG/ML  SOLN
100.0000 mL | Freq: Once | INTRAMUSCULAR | Status: AC | PRN
  Administered 2023-12-25: 80 mL via INTRAVENOUS

## 2023-12-25 NOTE — ED Triage Notes (Signed)
Pt via pov from home with abdominal/lower back pain. She reports that she is a diabetic and that she is on metoclopramide for slow emptying of the stomach; she doesn't think this is helping. Pt states her last bm was 4 days ago but that she doesn't feel constipated. Pt alert & oriented, NAD noted.

## 2023-12-25 NOTE — ED Provider Notes (Signed)
[place] EMERGENCY DEPARTMENT AT [facility] Provider Note   CSN: [id number] Arrival date & time: 12/25/23  1122     History  Chief Complaint  Patient presents with   Abdominal Pain    Tiffany Navarro is a 68 y.o. female.  Patient with history of orthopedic surgeries, diabetes, high cholesterol, hypothyroid, hypertension --presents to the emergency department today for evaluation of abdominal pain and back pain.  Patient has had difficulty with bowel movements over the past 2 weeks.  She reports that when she eats all of the food "sits in my stomach".  She has not had vomiting.  She was prescribed Reglan by her PCP and this is only made her feel worse.  She does not feel like it is working.  She reports no bowel movement in 4 days, when she does have a bowel movement she has passage of very small hard stool.  She does not think that she is constipated however.  She states that she has tried laxatives without improvement.  No fevers.  No blood in the stool.  No urinary symptoms.       Home Medications Prior to Admission medications   Medication Sig Start Date End Date Taking? Authorizing Provider  ALPRAZolam (XANAX) 0.25 MG tablet TAKE 1 TABLET BY MOUTH AT BEDTIME AS NEEDED FOR ANXIETY. 09/01/21   Raynelle Dick, NP  aspirin EC 81 MG EC tablet Take 1 tablet (81 mg total) by mouth daily. Swallow whole. 05/18/21   Lurene Shadow, MD  atorvastatin (LIPITOR) 40 MG tablet TAKE 1 TABLET BY MOUTH EVERY DAY 07/03/22   Chuck Hint, MD  fenofibrate 160 MG tablet Take 160 mg by mouth daily. 03/25/21   [provider]  FreeStyle Unistick II Lancets MISC Use four times a day as needed DX E11.9, E10.9, Z79.4 08/26/20   Romero Belling, MD  glucose blood (CONTOUR NEXT TEST) test strip 1 each by Other route 3 (three) times daily. TID 05/17/21   Lurene Shadow, MD  insulin degludec (TRESIBA) 100 UNIT/ML FlexTouch Pen Inject 72 Units into the skin daily. 08/14/21   Raynelle Dick, NP  Insulin Pen Needle 32G X 4 MM MISC by Does not apply route.    [provider]  LEVEMIR 100 UNIT/ML injection SMARTSIG:60 Unit(s) SUB-Q Daily 03/13/22   [provider]  levothyroxine (SYNTHROID) 112 MCG tablet  03/12/22   [provider]  lisinopril (ZESTRIL) 5 MG tablet Take 5 mg by mouth daily. 03/06/21   [provider]  moxifloxacin (AVELOX) 400 MG tablet Take 1 tablet (400 mg total) by mouth daily at 8 pm. Patient not taking: Reported on 04/05/2023 12/18/21   Yates Decamp, MD  pantoprazole (PROTONIX) 40 MG tablet Take 40 mg by mouth daily.    [provider]  pregabalin (LYRICA) 100 MG capsule 2 (two) times daily. 03/12/22   [provider]  traMADol (ULTRAM) 50 MG tablet Take 50 mg by mouth in the morning, at noon, and at bedtime.    [provider]      Allergies    Metformin and related    Review of Systems   Review of Systems  Physical Exam Updated Vital Signs BP (!) 175/112   Pulse 82   Temp (!) 97.4 F (36.3 C)   Resp 18   Ht 5\' 5"  (1.651 m)   Wt 78.9 kg   SpO2 97%   BMI 28.95 kg/m   Physical Exam Vitals and nursing note reviewed.  Constitutional:      General: She is not in acute distress.    Appearance: She is well-developed.  HENT:     Head: Normocephalic and atraumatic.     Right Ear: External ear normal.     Left Ear: External ear normal.     Nose: Nose normal.  Eyes:     Conjunctiva/sclera: Conjunctivae normal.  Cardiovascular:     Rate and Rhythm: Normal rate and regular rhythm.     Heart sounds: No murmur heard. Pulmonary:     Effort: No respiratory distress.     Breath sounds: No wheezing, rhonchi or rales.  Abdominal:     Palpations: Abdomen is soft.     Tenderness: There is no abdominal tenderness. There is no guarding or rebound. Negative signs include Murphy's sign and McBurney's sign.  Musculoskeletal:     Cervical back: Normal range of motion and neck supple.     Right  lower leg: No edema.     Left lower leg: No edema.  Skin:    General: Skin is warm and dry.     Findings: No rash.  Neurological:     General: No focal deficit present.     Mental Status: She is alert. Mental status is at baseline.     Motor: No weakness.  Psychiatric:        Mood and Affect: Mood normal.     ED Results / Procedures / Treatments   Labs (all labs ordered are listed, but only abnormal results are displayed) Labs Reviewed  COMPREHENSIVE METABOLIC PANEL - Abnormal; Notable for the following components:      Result Value   Glucose, Bld 167 (*)    All other components within normal limits  LIPASE, BLOOD  CBC  URINALYSIS, ROUTINE W REFLEX MICROSCOPIC    EKG None  Radiology CT ABDOMEN PELVIS W CONTRAST Result Date: 12/25/2023 CLINICAL DATA:  Acute abdominal pain for 2 weeks. EXAM: CT ABDOMEN AND PELVIS WITH CONTRAST TECHNIQUE: Multidetector CT imaging of the abdomen and pelvis was performed using the standard protocol following bolus administration of intravenous contrast. RADIATION DOSE REDUCTION: This exam was performed according to the departmental dose-optimization program which includes automated exposure control, adjustment of the mA and/or kV according to patient size and/or use of iterative reconstruction technique. CONTRAST:  80mL OMNIPAQUE IOHEXOL 300 MG/ML  SOLN COMPARISON:  06/20/2017 FINDINGS: Lower Chest: No acute findings. Hepatobiliary: No suspicious hepatic masses identified. Mild diffuse hepatic steatosis. Gallbladder is unremarkable. No evidence of biliary ductal dilatation. Pancreas:  No mass or inflammatory changes. Spleen: Within normal limits in size and appearance. Adrenals/Urinary Tract: No suspicious masses identified. No evidence of ureteral calculi or hydronephrosis. Stomach/Bowel: No evidence of obstruction, inflammatory process or abnormal fluid collections. Normal appendix visualized. Diverticulosis is seen mainly involving the sigmoid colon,  however there is no evidence of diverticulitis. Vascular/Lymphatic: No pathologically enlarged lymph nodes. No acute vascular findings. Reproductive:  No mass or other significant abnormality. Other:  None. Musculoskeletal:  No suspicious bone lesions identified. IMPRESSION: No acute findings. Colonic diverticulosis, without radiographic evidence of diverticulitis. Mild hepatic steatosis. Electronically Signed   By: Danae Orleans M.D.   On: 12/25/2023 15:16    Procedures Procedures    Medications Ordered in ED Medications  iohexol (OMNIPAQUE) 300 MG/ML solution 100 mL (80 mLs Intravenous Contrast Given 12/25/23 1424)    ED Course/ Medical Decision Making/ A&P    Patient seen and examined. History obtained directly from patient.   Labs/EKG: Personally reviewed  and interpreted lab work ordered in triage including CBC which is unremarkable; CMP with glucose 167, normal anion gap; lipase normal; UA without signs of infection or dehydration.  Imaging: Ordered CT abdomen pelvis.  Medications/Fluids: None ordered  Most recent vital signs reviewed and are as follows: BP (!) 175/112   Pulse 82   Temp (!) 97.4 F (36.3 C)   Resp 18   Ht 5\' 5"  (1.651 m)   Wt 78.9 kg   SpO2 97%   BMI 28.95 kg/m   Initial impression: Patient seems to have issues with constipation over the past couple of weeks without overt symptoms of obstruction.  Labs are reassuring.  We discussed further evaluation today with imaging to evaluate for any problems which would be causing constipation versus, given that her workup thus far has been very reassuring, follow-up with her PCP or GI.  She reports long lead times getting in with her doctors.  After discussion, she agrees to proceed with imaging today.  3:33 PM Reassessment performed. Patient appears stable, comfortable.  Imaging personally visualized and interpreted including: CT scan agree no signs of bowel obstruction, infectious process, does show stool throughout  the colon.  Reviewed pertinent lab work and imaging with patient at bedside. Questions answered.   Most current vital signs reviewed and are as follows: BP (!) 175/112   Pulse 82   Temp (!) 97.4 F (36.3 C)   Resp 18   Ht 5\' 5"  (1.651 m)   Wt 78.9 kg   SpO2 97%   BMI 28.95 kg/m   Plan: Discharge to home.   Prescriptions written for: None, patient will obtain medications over-the-counter  Other home care instructions discussed: Maintain good fiber intake, good hydration  ED return instructions discussed: The patient was urged to return to the Emergency Department immediately with worsening of current symptoms, worsening abdominal pain, persistent vomiting, blood noted in stools, fever, or any other concerns. The patient verbalized understanding.   Follow-up instructions discussed: Patient encouraged to follow-up with their PCP in 7 days.                                 Medical Decision Making Amount and/or Complexity of Data Reviewed Labs: ordered. Radiology: ordered.  Risk Prescription drug management.   For this patient's complaint of abdominal pain, the following conditions were considered on the differential diagnosis: gastritis/PUD, enteritis/duodenitis, appendicitis, cholelithiasis/cholecystitis, cholangitis, pancreatitis, ruptured viscus, colitis, diverticulitis, small/large bowel obstruction, proctitis, cystitis, pyelonephritis, ureteral colic, aortic dissection, aortic aneurysm. In women, ectopic pregnancy, pelvic inflammatory disease, ovarian cysts, and tubo-ovarian abscess were also considered. Atypical chest etiologies were also considered including ACS, PE, and pneumonia.  The patient's vital signs, pertinent lab work and imaging were reviewed and interpreted as discussed in the ED course. Hospitalization was considered for further testing, treatments, or serial exams/observation. However as patient is well-appearing, has a stable exam, and reassuring studies today,  I do not feel that they warrant admission at this time. This plan was discussed with the patient who verbalizes agreement and comfort with this plan and seems reliable and able to return to the Emergency Department with worsening or changing symptoms.          Final Clinical Impression(s) / ED Diagnoses Final diagnoses:  Constipation, unspecified constipation type  Left sided abdominal pain    Rx / DC Orders ED Discharge Orders     None  Renne Crigler, PA-C 12/25/23 1535    Ernie Avena, MD 12/26/23 [phone]

## 2023-12-25 NOTE — Discharge Instructions (Signed)
Please read and follow all provided instructions.  Your diagnoses today include:  1. Constipation, unspecified constipation type   2. Left sided abdominal pain     Tests performed today include: Blood cell counts and platelets Kidney and liver function tests Pancreas function test (called lipase) Urine test to look for infection CT scan of the abdomen and pelvis: Did not show signs of infection, blockage, or other serious problem but you do have large amount of stool throughout the entirety of the colon Vital signs. See below for your results today.   Medications prescribed:  None  Take any prescribed medications only as directed.  Home care instructions:  Follow any educational materials contained in this packet. Try medication such as magnesium citrate, glycerin suppositories for treatment of constipation.  Follow-up instructions: Please follow-up with your primary care provider in the next 7 days for further evaluation of your symptoms.    Return instructions:  SEEK IMMEDIATE MEDICAL ATTENTION IF: The pain does not go away or becomes severe  A temperature above 101F develops  Repeated vomiting occurs (multiple episodes)  The pain becomes localized to portions of the abdomen. The right side could possibly be appendicitis. In an adult, the left lower portion of the abdomen could be colitis or diverticulitis.  Blood is being passed in stools or vomit (bright red or black tarry stools)  You develop chest pain, difficulty breathing, dizziness or fainting, or become confused, poorly responsive, or inconsolable (young children) If you have any other emergent concerns regarding your health  Additional Information: Abdominal (belly) pain can be caused by many things. Your caregiver performed an examination and possibly ordered blood/urine tests and imaging (CT scan, x-rays, ultrasound). Many cases can be observed and treated at home after initial evaluation in the emergency  department. Even though you are being discharged home, abdominal pain can be unpredictable. Therefore, you need a repeated exam if your pain does not resolve, returns, or worsens. Most patients with abdominal pain don't have to be admitted to the hospital or have surgery, but serious problems like appendicitis and gallbladder attacks can start out as nonspecific pain. Many abdominal conditions cannot be diagnosed in one visit, so follow-up evaluations are very important.  Your vital signs today were: BP (!) 175/112   Pulse 82   Temp (!) 97.4 F (36.3 C)   Resp 18   Ht 5\' 5"  (1.651 m)   Wt 78.9 kg   SpO2 97%   BMI 28.95 kg/m  If your blood pressure (bp) was elevated above 135/85 this visit, please have this repeated by your doctor within one month. --------------

## 2023-12-25 NOTE — ED Notes (Signed)
PA at bedside.

## 2024-01-14 ENCOUNTER — Other Ambulatory Visit (HOSPITAL_COMMUNITY): Payer: Self-pay

## 2024-01-15 ENCOUNTER — Other Ambulatory Visit (HOSPITAL_COMMUNITY): Payer: Self-pay

## 2024-01-15 ENCOUNTER — Other Ambulatory Visit (HOSPITAL_BASED_OUTPATIENT_CLINIC_OR_DEPARTMENT_OTHER): Payer: Self-pay

## 2024-01-15 MED ORDER — INSULIN DEGLUDEC 100 UNIT/ML ~~LOC~~ SOPN
PEN_INJECTOR | SUBCUTANEOUS | 1 refills | Status: AC
  Filled 2024-01-15: qty 30, 50d supply, fill #0
  Filled 2024-04-13: qty 30, 50d supply, fill #1
  Filled 2024-10-18 – 2024-11-06 (×2): qty 30, 50d supply, fill #2

## 2024-01-15 MED ORDER — INSULIN DEGLUDEC 100 UNIT/ML ~~LOC~~ SOPN
60.0000 [IU] | PEN_INJECTOR | Freq: Every evening | SUBCUTANEOUS | 1 refills | Status: AC
  Filled 2024-02-25: qty 30, 50d supply, fill #0
  Filled 2024-05-29: qty 30, 50d supply, fill #1
  Filled 2024-05-29: qty 54, 90d supply, fill #1
  Filled 2024-08-22 – 2024-08-28 (×2): qty 54, 90d supply, fill #2

## 2024-01-17 ENCOUNTER — Other Ambulatory Visit (HOSPITAL_COMMUNITY): Payer: Self-pay

## 2024-01-17 ENCOUNTER — Ambulatory Visit (INDEPENDENT_AMBULATORY_CARE_PROVIDER_SITE_OTHER): Payer: Medicare Other

## 2024-01-17 DIAGNOSIS — I63411 Cerebral infarction due to embolism of right middle cerebral artery: Secondary | ICD-10-CM | POA: Diagnosis not present

## 2024-01-18 ENCOUNTER — Other Ambulatory Visit (HOSPITAL_COMMUNITY): Payer: Self-pay

## 2024-01-19 LAB — CUP PACEART REMOTE DEVICE CHECK
Date Time Interrogation Session: 20250217115748
Implantable Pulse Generator Implant Date: 20221207
Pulse Gen Model: 436066
Pulse Gen Serial Number: 94078688

## 2024-01-20 NOTE — Progress Notes (Signed)
 Biotronik Loop [employer]

## 2024-02-21 ENCOUNTER — Ambulatory Visit: Payer: Medicare Other

## 2024-02-21 DIAGNOSIS — I63411 Cerebral infarction due to embolism of right middle cerebral artery: Secondary | ICD-10-CM | POA: Diagnosis not present

## 2024-02-21 LAB — CUP PACEART REMOTE DEVICE CHECK
Date Time Interrogation Session: 20250324082141
Implantable Pulse Generator Implant Date: 20221207
Pulse Gen Model: 436066
Pulse Gen Serial Number: 94078688

## 2024-02-22 NOTE — Addendum Note (Signed)
 Addended by: Geralyn Flash D on: 02/22/2024 01:20 PM   Modules accepted: Orders

## 2024-02-22 NOTE — Progress Notes (Signed)
 Biotronik Loop [employer]

## 2024-02-25 ENCOUNTER — Other Ambulatory Visit: Payer: Self-pay

## 2024-02-25 ENCOUNTER — Other Ambulatory Visit (HOSPITAL_COMMUNITY): Payer: Self-pay

## 2024-03-27 ENCOUNTER — Ambulatory Visit (INDEPENDENT_AMBULATORY_CARE_PROVIDER_SITE_OTHER): Payer: Medicare Other

## 2024-03-27 DIAGNOSIS — I63411 Cerebral infarction due to embolism of right middle cerebral artery: Secondary | ICD-10-CM | POA: Diagnosis not present

## 2024-03-27 LAB — CUP PACEART REMOTE DEVICE CHECK
Date Time Interrogation Session: 20250428095801
Implantable Pulse Generator Implant Date: 20221207
Pulse Gen Model: 436066
Pulse Gen Serial Number: 94078688

## 2024-03-31 ENCOUNTER — Telehealth: Payer: Self-pay

## 2024-03-31 NOTE — Telephone Encounter (Signed)
 Alert received in Biotronik. Message sent to rep. This is caused by close contact with a strong EMI. Needs to be reinitiated in person via manual interrogation. Once you place the wand on the device it will have a pop up asking if you want to reinitiate the device. Will call patient to see when she can come in to do this.

## 2024-04-03 NOTE — Telephone Encounter (Signed)
 LMTCB

## 2024-04-04 NOTE — Telephone Encounter (Signed)
 Spoke with patient.  She is not happy with having a loop period. Feels like she was "swindled" by the doctor in to having one and may want to discuss discontinuing monitoring in the near future.   For now she does want to come in on Friday 5/9 at 1000am to have the ILR re-activated out of back up mode.   She will reach out when/if she is ready to d/c service in the future.

## 2024-04-04 NOTE — Telephone Encounter (Signed)
 Spoke to patients Tiffany Navarro (on DPR), advised to please have patient call back to discuss her cardiac Biotronik monitor needs reprogramming. Direct number provided and states he will have patient return call.

## 2024-04-06 NOTE — Progress Notes (Signed)
 Biotronik Loop [employer]

## 2024-04-07 ENCOUNTER — Ambulatory Visit: Attending: Cardiology

## 2024-04-07 DIAGNOSIS — I63411 Cerebral infarction due to embolism of right middle cerebral artery: Secondary | ICD-10-CM

## 2024-04-07 NOTE — Progress Notes (Signed)
 Device reinitiated. Device at [facility] 04/07/24.Pt unhappy with the loop. Says she was bamboozled in getting it. Would like to see EP to talk about removing it. Will send to EP scheduling.

## 2024-04-13 ENCOUNTER — Other Ambulatory Visit (HOSPITAL_COMMUNITY): Payer: Self-pay

## 2024-04-21 ENCOUNTER — Other Ambulatory Visit: Payer: Self-pay | Admitting: Physician Assistant

## 2024-04-21 DIAGNOSIS — I6521 Occlusion and stenosis of right carotid artery: Secondary | ICD-10-CM

## 2024-05-08 ENCOUNTER — Encounter: Payer: Self-pay | Admitting: Physician Assistant

## 2024-05-08 ENCOUNTER — Ambulatory Visit (HOSPITAL_COMMUNITY)
Admission: RE | Admit: 2024-05-08 | Discharge: 2024-05-08 | Disposition: A | Discharge status: Home / Self Care | Source: Ambulatory Visit | Attending: Surgery | Admitting: Surgery

## 2024-05-08 ENCOUNTER — Ambulatory Visit: Admitting: Physician Assistant

## 2024-05-08 VITALS — BP 115/73 | HR 73 | Temp 98.1°F | Ht 65.0 in | Wt 182.3 lb

## 2024-05-08 DIAGNOSIS — I6521 Occlusion and stenosis of right carotid artery: Secondary | ICD-10-CM

## 2024-05-08 MED ORDER — ATORVASTATIN CALCIUM 40 MG PO TABS: 40.0000 mg | ORAL_TABLET | Freq: Every day | ORAL | 11 refills | Status: AC

## 2024-05-08 NOTE — Progress Notes (Signed)
 Office Note     CC:  follow up Requesting Provider:  Thomasene Flemings, NP  HPI: Tiffany Navarro is a 68 y.o. ([birth year]) female who presents for surveillance follow up of carotid artery stenosis.She has history of right TCAR for symptomatic carotid artery stenosis on 05/16/2021 by Dr. Charlotte Cookey. She has residual left sided upper extremity weakness from her stroke.   She has had no new TIA or stroke like symptoms since her surgery. Today she is here with her daughter in law. She reports overall doing well. She says she does have some blurred vision but she attributes this to her diabetes. She says she has not gotten her eyes checked in over 5 years. She otherwise denies any amaurosis fugax, slurred speech, facial drooping, unilateral upper or lower extremity weakness or numbness. She denies any pain in her legs on ambulation or rest. No tissue loss.   The pt is on a statin for cholesterol management.  The pt is on a daily aspirin .   Other AC:  none The pt is on ACEI for hypertension.   The pt is  on medication for diabetes Tobacco hx:  former  Past Medical History:  Diagnosis Date   Carotid artery occlusion    Cryptogenic stroke (HCC) 12/11/2021   Hypercholesteremia    Hypertension    Hypothyroidism    Loop Biotronik Biomonitor III 12/11/2021 12/11/2021   Osteoarthritis    Thyroid  disease     Past Surgical History:  Procedure Laterality Date   BACK SURGERY  82   HIP PINNING Left 2012   x3   pin removed last surgery   JOINT REPLACEMENT     TOTAL KNEE ARTHROPLASTY Left 02/17/2016   Procedure: TOTAL KNEE ARTHROPLASTY;  Surgeon: Christie Cox, MD;  Location: [facility] OR;  Service: Orthopedics;  Laterality: Left;   TOTAL KNEE ARTHROPLASTY Right 05/11/2016   Procedure: TOTAL KNEE ARTHROPLASTY;  Surgeon: Christie Cox, MD;  Location: [facility] OR;  Service: Orthopedics;  Laterality: Right;   TRANSCAROTID ARTERY REVASCULARIZATION  Right 05/16/2021   Procedure: RIGHT TRANSCAROTID ARTERY REVASCULARIZATION;   Surgeon: Margherita Shell, MD;  Location: [facility];  Service: Vascular;  Laterality: Right;   ULTRASOUND GUIDANCE FOR VASCULAR ACCESS Left 05/16/2021   Procedure: ULTRASOUND GUIDANCE FOR VASCULAR ACCESS, left femoral vein;  Surgeon: Margherita Shell, MD;  Location: [facility];  Service: Vascular;  Laterality: Left;    Social History   Socioeconomic History   Marital status: Widowed    Spouse name: Not on file   Number of children: 4   Years of education: Not on file   Highest education level: Not on file  Occupational History   Not on file  Tobacco Use   Smoking status: Former    Current packs/day: 0.00    Average packs/day: 0.3 packs/day for 40.0 years (10.0 ttl pk-yrs)    Types: Cigarettes    Start date: 56    Quit date: 2017    Years since quitting: 8.4   Smokeless tobacco: Never  Vaping Use   Vaping status: Some Days  Substance and Sexual Activity   Alcohol use: Never   Drug use: Never   Sexual activity: Not on file  Other Topics Concern   Not on file  Social History Narrative   Not on file   Social Drivers of Health   Financial Resource Strain: Not on file  Food Insecurity: Not on file  Transportation Needs: Not on file  Physical Activity: Not on file  Stress: Not on  file  Social Connections: Not on file  Intimate Partner Violence: Not on file    Family History  Problem Relation Age of Onset   Hypertension Mother    Diabetes Mother    Hypertension Father    Stroke Father    Diabetes Father    Hypertension Brother    Diabetes Brother    Cancer Brother        skin   Thyroid  disease Neg Hx     Current Outpatient Medications  Medication Sig Dispense Refill   ALPRAZolam  (XANAX ) 0.25 MG tablet TAKE 1 TABLET BY MOUTH AT BEDTIME AS NEEDED FOR ANXIETY. 30 tablet 0   aspirin  EC 81 MG EC tablet Take 1 tablet (81 mg total) by mouth daily. Swallow whole.     atorvastatin  (LIPITOR) 40 MG tablet TAKE 1 TABLET BY MOUTH EVERY DAY 90 tablet 9   fenofibrate  160 MG tablet  Take 160 mg by mouth daily.     FreeStyle Unistick II Lancets MISC Use four times a day as needed DX E11.9, E10.9, Z79.4 200 each 3   glucose blood (CONTOUR NEXT TEST) test strip 1 each by Other route 3 (three) times daily. TID     insulin  degludec (TRESIBA ) 100 UNIT/ML FlexTouch Pen Inject 72 Units into the skin daily. 42 mL 1   insulin  degludec (TRESIBA ) 100 UNIT/ML FlexTouch Pen Inject 60 units under the skin nightly. 80 mL 1   insulin  degludec (TRESIBA ) 100 UNIT/ML FlexTouch Pen Inject 60 Units into the skin at bedtime. 75 mL 1   Insulin  Pen Needle 32G X 4 MM MISC by Does not apply route.     LEVEMIR 100 UNIT/ML injection SMARTSIG:60 Unit(s) SUB-Q Daily     levothyroxine  (SYNTHROID ) 112 MCG tablet      lisinopril (ZESTRIL) 5 MG tablet Take 5 mg by mouth daily.     moxifloxacin  (AVELOX ) 400 MG tablet Take 1 tablet (400 mg total) by mouth daily at 8 pm. (Patient not taking: Reported on 04/05/2023) 7 tablet 0   pantoprazole  (PROTONIX ) 40 MG tablet Take 40 mg by mouth daily.     pregabalin  (LYRICA ) 100 MG capsule 2 (two) times daily.     traMADol  (ULTRAM ) 50 MG tablet Take 50 mg by mouth in the morning, at noon, and at bedtime.     No current facility-administered medications for this visit.    Allergies  Allergen Reactions   Metformin And Related Swelling    Tongue and throat swelling     REVIEW OF SYSTEMS:  [X]  denotes positive finding, [ ]  denotes negative finding Cardiac  Comments:  Chest pain or chest pressure:    Shortness of breath upon exertion:    Short of breath when lying flat:    Irregular heart rhythm:        Vascular    Pain in calf, thigh, or hip brought on by ambulation:    Pain in feet at night that wakes you up from your sleep:     Blood clot in your veins:    Leg swelling:         Pulmonary    Oxygen at home:    Productive cough:     Wheezing:         Neurologic    Sudden weakness in arms or legs:     Sudden numbness in arms or legs:     Sudden onset of  difficulty speaking or slurred speech:    Temporary loss of vision in one eye:  Problems with dizziness:         Gastrointestinal    Blood in stool:     Vomited blood:         Genitourinary    Burning when urinating:     Blood in urine:        Psychiatric    Major depression:         Hematologic    Bleeding problems:    Problems with blood clotting too easily:        Skin    Rashes or ulcers:        Constitutional    Fever or chills:      PHYSICAL EXAMINATION:  Vitals:   05/08/24 1219 05/08/24 1220  BP: 113/77 115/73  Pulse: 73   Temp: 98.1 F (36.7 C)   TempSrc: Temporal   SpO2: 94%   Weight: 182 lb 4.8 oz (82.7 kg)   Height: 5\' 5"  (1.651 m)     General:  WDWN in NAD; vital signs documented above Gait: Normal HENT: WNL, normocephalic Pulmonary: normal non-labored breathing Cardiac: regular HR Abdomen: soft, NT, no masses Vascular Exam/Pulses: 2+ radial pulses, 2+ DP pulses Extremities: without ischemic changes, without Gangrene , without cellulitis; without open wounds;  Musculoskeletal: no muscle wasting or atrophy  Neurologic: A&O X 3 Psychiatric:  The pt has Normal affect.   Non-Invasive Vascular Imaging:   VAS US  Carotid Duplex: Summary:  Right Carotid: Patent stent with mid stent velocity in the 50-75% range.   Left Carotid: Velocities in the left ICA are consistent with a 1-39% stenosis.   Vertebrals: Bilateral vertebral arteries demonstrate antegrade flow.  Subclavians: Normal flow hemodynamics were seen in the left subclavian artery. Right subclavian waveform is monophasic.    ASSESSMENT/PLAN:: 68 y.o. female here for follow up for carotid artery stenosis. She has history of right TCAR for symptomatic carotid artery stenosis on 05/16/2021 by Dr. Charlotte Cookey. She has residual left sided upper extremity weakness from her stroke. She is without any associated TIA or stroke like symptoms.  - Duplex today shows some elevated velocities in the  proximal right ICA stent. These were not present on prior duplex 1 year ago. The ICA stent is patent - continue Aspirin  and statin - Reviewed TIA/ Stroke like symptoms and she understands should these occur to seek immediate medical attention - Recommend earlier follow up in 6 months with repeat carotid duplex   Deneen Finical, PA-C [facility] [phone]  Clinic MD:   Charlotte Cookey

## 2024-05-10 ENCOUNTER — Other Ambulatory Visit: Payer: Self-pay

## 2024-05-10 DIAGNOSIS — I6521 Occlusion and stenosis of right carotid artery: Secondary | ICD-10-CM

## 2024-05-11 NOTE — Progress Notes (Signed)
 Biotronik Loop [employer]

## 2024-05-29 ENCOUNTER — Other Ambulatory Visit (HOSPITAL_COMMUNITY): Payer: Self-pay

## 2024-05-30 ENCOUNTER — Other Ambulatory Visit (HOSPITAL_COMMUNITY): Payer: Self-pay

## 2024-06-26 NOTE — Progress Notes (Unsigned)
 Electrophysiology Office Note:   Date:  06/27/2024  ID:  Tiffany Navarro, DOB June 02, 1956, MRN [id number]  Primary Cardiologist: None Electrophysiologist: Fonda Kitty, MD      History of Present Illness:   Tiffany Navarro is a 68 y.o. female with h/o type 1 diabetes, hypertension, hyperlipidemia, hypothyroidism, history of stroke on 05/13/2021 s/p loop recorder 11/2021 who is being seen today for management of her loop recorder.   Discussed the use of AI scribe software for clinical note transcription with the patient, who gave verbal consent to proceed.  She seeks removal of a loop recorder that was placed following a stroke. The device was implanted approximately four months after her stroke, and she feels it was unnecessary as all subsequent reports have been normal.  She has been receiving monthly bills for monitoring, which she finds confusing.  She is otherwise doing well and has no new or acute complaints today.  Review of systems complete and found to be negative unless listed in HPI.   EP Information / Studies Reviewed:    EKG is ordered today. Personal review as below.  EKG Interpretation Date/Time:  Tuesday June 27 2024 12:12:08 EDT Ventricular Rate:  76 PR Interval:  192 QRS Duration:  90 QT Interval:  358 QTC Calculation: 402 R Axis:   -41  Text Interpretation: Sinus rhythm with occasional Premature ventricular complexes Left axis deviation When compared with ECG of 13-May-2021 14:47,  PVC now present. Confirmed by Kitty Fonda [phone]) on 06/27/2024 10:34:39 PM   Exercise nuclear stress test 11/12/2021: Normal ECG stress. Resting EKG normal sinus rhythm, left axis deviation, left anterior fascicular block, incomplete right bundle branch block, low voltage.  Stress EKG is negative for myocardial ischemia.  There were frequent PVCs in trigeminal pattern and occasional couplets and triplets during stress test, PVC burden decreased at peak exercise to return in recovery.   There was no chest pain, exercise capacity was normal, normal blood pressure response. Myocardial perfusion is normal. Overall LV systolic function is normal without regional wall motion abnormalities. Stress LV EF: 53%.  No previous exam available for comparison. Low risk.  Echo 04/2021:  1. Left ventricular ejection fraction, by estimation, is 60 to 65%. The  left ventricle has normal function. The left ventricle has no regional  wall motion abnormalities. Left ventricular diastolic parameters were  normal.   2. Right ventricular systolic function is normal. The right ventricular  size is normal. Tricuspid regurgitation signal is inadequate for assessing  PA pressure.   3. The mitral valve is normal in structure. No evidence of mitral valve  regurgitation. No evidence of mitral stenosis.   4. The aortic valve is tricuspid. Aortic valve regurgitation is not  visualized. No aortic stenosis is present.    Physical Exam:   VS:  BP 118/64 (BP Location: Left Arm, Patient Position: Sitting, Cuff Size: Normal)   Pulse 76   Ht 5' 5 (1.651 m)   Wt 182 lb (82.6 kg)   SpO2 97%   BMI 30.29 kg/m    Wt Readings from Last 3 Encounters:  06/27/24 182 lb (82.6 kg)  05/08/24 182 lb 4.8 oz (82.7 kg)  12/25/23 173 lb 15.1 oz (78.9 kg)     GEN: Well nourished, well developed in no acute distress NECK: No JVD CARDIAC: Normal rate, regular rhythm RESPIRATORY:  Clear to auscultation without rales, wheezing or rhonchi  ABDOMEN: Soft, non-distended EXTREMITIES:  No edema; No deformity   ASSESSMENT AND PLAN:     #.  Stroke s/p loop recorder:  -Patient has had monitoring for almost 32 months.  She has had no atrial fibrillation.  Patient is requesting to have loop recorder removed.  Discussed role of loop recorder and monitoring for atrial fibrillation in setting of stroke.  Discussed risk and benefits of loop recorder removal, including but not limited to pain, bleeding, infection.  Patient voiced  understanding would like to proceed today.  #. Hypertension -At goal today.  Recommend checking blood pressures 1-2 times per week at home and recording the values.  Recommend bringing these recordings to the primary care physician.  SURGEON:  Fonda Kitty, MD     PREPROCEDURE DIAGNOSIS: Status post loop recorder for cryptogenic stroke    POSTPROCEDURE DIAGNOSIS: Status post loop recorder for cryptogenic stroke     PROCEDURES:   1. Implantable loop recorder implantation    INTRODUCTION:  Tiffany Navarro presents with a history of cryptogenic stroke status post loop recorder who is requesting explant.    DESCRIPTION OF PROCEDURE:  Informed written consent was obtained.   Time Out Completed with RN   The patients left chest was therefore prepped and draped in the usual sterile fashion. The skin overlying the existing loop recorder was infiltrated with lidocaine  for local analgesia.  A 0.5-cm incision was made over the proximal end of the loop recorder device.  The Biotronik ILR was dissected free from the subcutaneous pocket using a combination of sharp and blunt dissection.   Steri-strips and a sterile dressing were then applied.  There were no early apparent complications.     CONCLUSIONS:   1. Successful explant of a Biotronik loop recorder.   2. No early apparent complications.   Follow up with Dr. Kitty as needed.   Signed, Fonda Kitty, MD

## 2024-06-27 ENCOUNTER — Ambulatory Visit: Attending: Internal Medicine | Admitting: Cardiology

## 2024-06-27 VITALS — BP 118/64 | HR 76 | Ht 65.0 in | Wt 182.0 lb

## 2024-06-27 DIAGNOSIS — I10 Essential (primary) hypertension: Secondary | ICD-10-CM

## 2024-06-27 DIAGNOSIS — Z95818 Presence of other cardiac implants and grafts: Secondary | ICD-10-CM

## 2024-06-27 NOTE — Patient Instructions (Signed)
 Medication Instructions:  Your physician recommends that you continue on your current medications as directed. Please refer to the Current Medication list given to you today.  Labwork: None ordered.  Testing/Procedures: None ordered.  Follow-Up:  As needed with Dr. Jimmey Ralph   Implantable Loop Recorder Removal, Care After This sheet gives you information about how to care for yourself after your procedure. Your health care provider may also give you more specific instructions. If you have problems or questions, contact your health care provider. What can I expect after the procedure? After the procedure, it is common to have: Soreness or discomfort near the incision. Some swelling or bruising near the incision.  Follow these instructions at home: Incision care  Monitor your cardiac device site for redness, swelling, and drainage. Call the device clinic at [phone] if you experience these symptoms or fever/chills.  Keep the large square bandage on your site for 24 hours and then you may remove it yourself. Keep the steri-strips underneath in place.   You may shower after 72 hours / 3 days from your procedure with the steri-strips in place. They will usually fall off on their own, or may be removed after 10 days. Pat dry.   Avoid lotions, ointments, or perfumes over your incision until it is well-healed.  Please do not submerge in water until your site is completely healed.   If your wound site starts to bleed apply pressure.       If you have any questions/concerns please call the device clinic at [phone].  Activity  Return to your normal activities.  Contact a health care provider if: You have redness, swelling, or pain around your incision. You have a fever.

## 2024-08-22 ENCOUNTER — Other Ambulatory Visit (HOSPITAL_COMMUNITY): Payer: Self-pay

## 2024-08-28 ENCOUNTER — Other Ambulatory Visit: Payer: Self-pay

## 2024-10-18 ENCOUNTER — Other Ambulatory Visit (HOSPITAL_COMMUNITY): Payer: Self-pay

## 2024-10-24 ENCOUNTER — Other Ambulatory Visit (HOSPITAL_COMMUNITY): Payer: Self-pay

## 2024-10-24 MED ORDER — LANTUS SOLOSTAR 100 UNIT/ML ~~LOC~~ SOPN
60.0000 [IU] | PEN_INJECTOR | Freq: Every evening | SUBCUTANEOUS | 1 refills | Status: AC
Start: 2024-10-24 — End: ?
  Filled 2024-10-24: qty 54, 90d supply, fill #0

## 2024-10-24 MED ORDER — INSULIN LISPRO (1 UNIT DIAL) 100 UNIT/ML (KWIKPEN)
10.0000 [IU] | PEN_INJECTOR | Freq: Three times a day (TID) | SUBCUTANEOUS | 1 refills | Status: AC
  Filled 2024-10-24: qty 30, 100d supply, fill #0
  Filled 2024-10-24: qty 30, 90d supply, fill #0
  Filled 2024-11-06: qty 30, 100d supply, fill #0

## 2024-11-03 ENCOUNTER — Other Ambulatory Visit (HOSPITAL_COMMUNITY): Payer: Self-pay

## 2024-11-06 ENCOUNTER — Other Ambulatory Visit (HOSPITAL_COMMUNITY): Payer: Self-pay

## 2024-11-09 ENCOUNTER — Other Ambulatory Visit (HOSPITAL_COMMUNITY): Payer: Self-pay

## 2024-11-13 ENCOUNTER — Ambulatory Visit (HOSPITAL_COMMUNITY): Admission: RE | Admit: 2024-11-13 | Discharge: 2024-11-13 | Attending: Surgery | Admitting: Surgery

## 2024-11-13 ENCOUNTER — Ambulatory Visit

## 2024-11-13 VITALS — BP 143/79 | HR 71 | Temp 97.7°F | Wt 187.0 lb

## 2024-11-13 DIAGNOSIS — I6521 Occlusion and stenosis of right carotid artery: Secondary | ICD-10-CM

## 2024-11-13 NOTE — Progress Notes (Signed)
 Office Note     CC:  follow up Requesting Provider:  Salina Clarity, NP  HPI: Tiffany Navarro is a 68 y.o. (January 20, 1956) female who presents for surveillance of carotid artery stenosis.  She has history of a TCAR for symptomatic carotid artery stenosis on 05/16/2021 by Dr. Serene.  She has some residual left-sided upper extremity weakness from her CVA.  She denies any new diagnosis of CVA or TIA since last office visit.  She also denies any current strokelike symptoms including slurring speech, changes in vision, or one-sided weakness.  She says she has arthritis in her neck which has been present for many years.  If she sleeps wrong she will have right face numbness however this resolves with a heating pad.  She is on aspirin  and statin daily.  She is a former smoker.  Past medical history also significant for insulin -dependent diabetes mellitus.   Past Medical History:  Diagnosis Date   Carotid artery occlusion    Cryptogenic stroke (HCC) 12/11/2021   Hypercholesteremia    Hypertension    Hypothyroidism    Loop Biotronik Biomonitor III 12/11/2021 12/11/2021   Osteoarthritis    Thyroid  disease     Past Surgical History:  Procedure Laterality Date   BACK SURGERY  82   HIP PINNING Left 2012   x3   pin removed last surgery   JOINT REPLACEMENT     TOTAL KNEE ARTHROPLASTY Left 02/17/2016   Procedure: TOTAL KNEE ARTHROPLASTY;  Surgeon: Marcey Raman, MD;  Location: [facility];  Service: Orthopedics;  Laterality: Left;   TOTAL KNEE ARTHROPLASTY Right 05/11/2016   Procedure: TOTAL KNEE ARTHROPLASTY;  Surgeon: Marcey Raman, MD;  Location: [facility];  Service: Orthopedics;  Laterality: Right;   TRANSCAROTID ARTERY REVASCULARIZATION  Right 05/16/2021   Procedure: RIGHT TRANSCAROTID ARTERY REVASCULARIZATION;  Surgeon: Serene Gaile ORN, MD;  Location: [facility];  Service: Vascular;  Laterality: Right;   ULTRASOUND GUIDANCE FOR VASCULAR ACCESS Left 05/16/2021   Procedure: ULTRASOUND GUIDANCE FOR VASCULAR ACCESS, left  femoral vein;  Surgeon: Serene Gaile ORN, MD;  Location: [facility];  Service: Vascular;  Laterality: Left;    Social History   Socioeconomic History   Marital status: Widowed    Spouse name: Not on file   Number of children: 4   Years of education: Not on file   Highest education level: Not on file  Occupational History   Not on file  Tobacco Use   Smoking status: Former    Current packs/day: 0.00    Average packs/day: 0.3 packs/day for 40.0 years (10.0 ttl pk-yrs)    Types: Cigarettes    Start date: 65    Quit date: 2017    Years since quitting: 8.9   Smokeless tobacco: Never  Vaping Use   Vaping status: Some Days  Substance and Sexual Activity   Alcohol use: Never   Drug use: Never   Sexual activity: Not on file  Other Topics Concern   Not on file  Social History Narrative   Not on file   Social Drivers of Health   Tobacco Use: Medium Risk (11/13/2024)   Patient History    Smoking Tobacco Use: Former    Smokeless Tobacco Use: Never    Passive Exposure: Not on Actuary Strain: Not on file  Food Insecurity: Not on file  Transportation Needs: Not on file  Physical Activity: Not on file  Stress: Not on file  Social Connections: Not on file  Intimate Partner Violence: Not  on file  Depression (PHQ2-9): Not on file  Alcohol Screen: Not on file  Housing: Not on file  Utilities: Not on file  Health Literacy: Not on file    Family History  Problem Relation Age of Onset   Hypertension Mother    Diabetes Mother    Hypertension Father    Stroke Father    Diabetes Father    Hypertension Brother    Diabetes Brother    Cancer Brother        skin   Thyroid  disease Neg Hx     Current Outpatient Medications  Medication Sig Dispense Refill   ALPRAZolam  (XANAX ) 0.25 MG tablet TAKE 1 TABLET BY MOUTH AT BEDTIME AS NEEDED FOR ANXIETY. 30 tablet 0   aspirin  EC 81 MG EC tablet Take 1 tablet (81 mg total) by mouth daily. Swallow whole.     atorvastatin   (LIPITOR) 40 MG tablet Take 1 tablet (40 mg total) by mouth daily. 30 tablet 11   fenofibrate  160 MG tablet Take 160 mg by mouth daily.     FreeStyle Unistick II Lancets MISC Use four times a day as needed DX E11.9, E10.9, Z79.4 200 each 3   glucose blood (CONTOUR NEXT TEST) test strip 1 each by Other route 3 (three) times daily. TID     insulin  degludec (TRESIBA ) 100 UNIT/ML FlexTouch Pen Inject 72 Units into the skin daily. 42 mL 1   insulin  degludec (TRESIBA ) 100 UNIT/ML FlexTouch Pen Inject 60 units under the skin nightly. 80 mL 1   insulin  degludec (TRESIBA ) 100 UNIT/ML FlexTouch Pen Inject 60 Units into the skin at bedtime. 75 mL 1   insulin  glargine (LANTUS  SOLOSTAR) 100 UNIT/ML Solostar Pen Inject 60 Units into the skin at bedtime. 54 mL 1   insulin  lispro (HUMALOG  KWIKPEN) 100 UNIT/ML KwikPen Inject 10 Units into the skin 3 (three) times daily. 30 mL 1   Insulin  Pen Needle 32G X 4 MM MISC by Does not apply route.     LEVEMIR 100 UNIT/ML injection SMARTSIG:60 Unit(s) SUB-Q Daily     levothyroxine  (SYNTHROID ) 112 MCG tablet      lisinopril (ZESTRIL) 5 MG tablet Take 5 mg by mouth daily.     moxifloxacin  (AVELOX ) 400 MG tablet Take 1 tablet (400 mg total) by mouth daily at 8 pm. 7 tablet 0   pantoprazole  (PROTONIX ) 40 MG tablet Take 40 mg by mouth daily.     pregabalin  (LYRICA ) 100 MG capsule 2 (two) times daily.     traMADol  (ULTRAM ) 50 MG tablet Take 50 mg by mouth in the morning, at noon, and at bedtime.     No current facility-administered medications for this visit.    Allergies[1]   REVIEW OF SYSTEMS:  Negative unless noted in HPI [X]  denotes positive finding, [ ]  denotes negative finding Cardiac  Comments:  Chest pain or chest pressure:    Shortness of breath upon exertion:    Short of breath when lying flat:    Irregular heart rhythm:        Vascular    Pain in calf, thigh, or hip brought on by ambulation:    Pain in feet at night that wakes you up from your sleep:      Blood clot in your veins:    Leg swelling:         Pulmonary    Oxygen at home:    Productive cough:     Wheezing:  Neurologic    Sudden weakness in arms or legs:     Sudden numbness in arms or legs:     Sudden onset of difficulty speaking or slurred speech:    Temporary loss of vision in one eye:     Problems with dizziness:         Gastrointestinal    Blood in stool:     Vomited blood:         Genitourinary    Burning when urinating:     Blood in urine:        Psychiatric    Major depression:         Hematologic    Bleeding problems:    Problems with blood clotting too easily:        Skin    Rashes or ulcers:        Constitutional    Fever or chills:      PHYSICAL EXAMINATION:  Vitals:   11/13/24 1033 11/13/24 1036  BP: 128/85 (!) 143/79  Pulse: 76 71  Temp: 97.7 F (36.5 C)   TempSrc: Temporal   Weight: 187 lb (84.8 kg)     General:  WDWN in NAD; vital signs documented above Gait: Not observed HENT: WNL, normocephalic Pulmonary: normal non-labored breathing Cardiac: regular HR Abdomen: soft, NT, no masses Skin: without rashes Vascular Exam/Pulses: palpable radial pulses Extremities: without ischemic changes, without Gangrene , without cellulitis; without open wounds;  Musculoskeletal: no muscle wasting or atrophy  Neurologic: A&O X 3; CN grossly intact Psychiatric:  The pt has Normal affect.   Non-Invasive Vascular Imaging:   Right ICA stent widely patent  Left ICA 1 to 39% stenosis    ASSESSMENT/PLAN:: 68 y.o. female here for follow up for surveillance of carotid artery stenosis  Subjectively doing well since last office visit.  Denies any strokelike symptoms.  Duplex demonstrates a widely patent right ICA stent.  Left ICA stenosis was stable at 1 to 39%.  She complains of arthritis all over her body.  If she sleeps on her pillow wrong she wakes up with right face numbness related to arthritis in her neck.  She states the numbness  resolves with use of a heating pad.  I expressed my concern for the symptoms.  I asked her to call [phone] if the symptoms worsen or no longer resolve with the use of a heating pad.  We again reviewed, strokelike symptoms.  She will continue her aspirin  and statin daily.  Will repeat her carotid duplex in 1 year.   Donnice Sender, PA-C [facility] [phone]  Clinic MD:   Serene     [1]  Allergies Allergen Reactions   Metformin And Related Swelling    Tongue and throat swelling

## 2024-12-07 ENCOUNTER — Ambulatory Visit: Admitting: Family Medicine
